# Patient Record
Sex: Male | Born: 1945 | Race: White | Hispanic: No | Marital: Married | Smoking: Former smoker
Health system: Southern US, Community
[De-identification: ages and names within clinical notes are randomized; demographics above are authoritative.]

## PROBLEM LIST (undated history)

## (undated) DIAGNOSIS — N289 Disorder of kidney and ureter, unspecified: Secondary | ICD-10-CM

## (undated) DIAGNOSIS — C140 Malignant neoplasm of pharynx, unspecified: Secondary | ICD-10-CM

## (undated) DIAGNOSIS — C801 Malignant (primary) neoplasm, unspecified: Secondary | ICD-10-CM

## (undated) DIAGNOSIS — Z973 Presence of spectacles and contact lenses: Secondary | ICD-10-CM

## (undated) DIAGNOSIS — Z941 Heart transplant status: Secondary | ICD-10-CM

## (undated) DIAGNOSIS — Z972 Presence of dental prosthetic device (complete) (partial): Secondary | ICD-10-CM

## (undated) DIAGNOSIS — I10 Essential (primary) hypertension: Secondary | ICD-10-CM

## (undated) HISTORY — DX: Malignant (primary) neoplasm, unspecified: C80.1

## (undated) HISTORY — PX: COLONOSCOPY: SHX174

## (undated) HISTORY — PX: APPENDECTOMY: SHX54

---

## 1999-01-19 ENCOUNTER — Inpatient Hospital Stay (HOSPITAL_COMMUNITY): Admission: EM | Admit: 1999-01-19 | Discharge: 1999-01-23 | Payer: Self-pay | Admitting: Emergency Medicine

## 2000-08-31 ENCOUNTER — Ambulatory Visit (HOSPITAL_COMMUNITY): Admission: RE | Admit: 2000-08-31 | Discharge: 2000-08-31 | Payer: Self-pay | Admitting: *Deleted

## 2001-12-18 DIAGNOSIS — Z941 Heart transplant status: Secondary | ICD-10-CM

## 2001-12-18 HISTORY — DX: Heart transplant status: Z94.1

## 2001-12-18 HISTORY — PX: HEART TRANSPLANT: SHX268

## 2001-12-21 ENCOUNTER — Encounter: Payer: Self-pay | Admitting: Cardiology

## 2001-12-21 ENCOUNTER — Inpatient Hospital Stay (HOSPITAL_COMMUNITY): Admission: EM | Admit: 2001-12-21 | Discharge: 2001-12-22 | Payer: Self-pay | Admitting: Cardiology

## 2001-12-21 ENCOUNTER — Encounter: Payer: Self-pay | Admitting: Internal Medicine

## 2001-12-21 ENCOUNTER — Encounter: Payer: Self-pay | Admitting: Emergency Medicine

## 2001-12-22 ENCOUNTER — Encounter: Payer: Self-pay | Admitting: Internal Medicine

## 2006-12-18 HISTORY — PX: HERNIA REPAIR: SHX51

## 2009-02-03 ENCOUNTER — Ambulatory Visit: Payer: Self-pay | Admitting: Gastroenterology

## 2009-02-11 ENCOUNTER — Ambulatory Visit (HOSPITAL_COMMUNITY): Admission: RE | Admit: 2009-02-11 | Discharge: 2009-02-11 | Payer: Self-pay | Admitting: Gastroenterology

## 2009-02-16 ENCOUNTER — Telehealth (INDEPENDENT_AMBULATORY_CARE_PROVIDER_SITE_OTHER): Payer: Self-pay | Admitting: *Deleted

## 2009-02-16 LAB — CONVERTED CEMR LAB
ALT: 13 units/L (ref 0–53)
AST: 13 units/L (ref 0–37)
Albumin: 4.6 g/dL (ref 3.5–5.2)
Alkaline Phosphatase: 86 units/L (ref 39–117)
BUN: 34 mg/dL — ABNORMAL HIGH (ref 6–23)
Basophils Absolute: 0 10*3/uL (ref 0.0–0.1)
Basophils Relative: 0 % (ref 0–1)
CO2: 21 meq/L (ref 19–32)
Calcium: 9.9 mg/dL (ref 8.4–10.5)
Chloride: 105 meq/L (ref 96–112)
Creatinine, Ser: 1.81 mg/dL — ABNORMAL HIGH (ref 0.40–1.50)
Eosinophils Absolute: 0.1 10*3/uL (ref 0.0–0.7)
Eosinophils Relative: 1 % (ref 0–5)
Glucose, Bld: 84 mg/dL (ref 70–99)
HCT: 38.8 % — ABNORMAL LOW (ref 39.0–52.0)
Helicobacter Pylori Antibody-IgG: <0.4
Hemoglobin: 12.8 g/dL — ABNORMAL LOW (ref 13.0–17.0)
Lymphocytes Relative: 17 % (ref 12–46)
Lymphs Abs: 1.4 10*3/uL (ref 0.7–4.0)
MCHC: 33 g/dL (ref 30.0–36.0)
MCV: 92.6 fL (ref 78.0–100.0)
Monocytes Absolute: 1 10*3/uL (ref 0.1–1.0)
Monocytes Relative: 12 % (ref 3–12)
Neutro Abs: 6.1 10*3/uL (ref 1.7–7.7)
Neutrophils Relative %: 71 % (ref 43–77)
Platelets: 215 10*3/uL (ref 150–400)
Potassium: 4.8 meq/L (ref 3.5–5.3)
RBC: 4.19 M/uL — ABNORMAL LOW (ref 4.22–5.81)
RDW: 13.3 % (ref 11.5–15.5)
Sodium: 139 meq/L (ref 135–145)
Total Bilirubin: 0.4 mg/dL (ref 0.3–1.2)
Total Protein: 6.7 g/dL (ref 6.0–8.3)
WBC: 8.7 10*3/uL (ref 4.0–10.5)

## 2009-03-18 ENCOUNTER — Telehealth (INDEPENDENT_AMBULATORY_CARE_PROVIDER_SITE_OTHER): Payer: Self-pay

## 2009-03-24 ENCOUNTER — Encounter: Payer: Self-pay | Admitting: Gastroenterology

## 2009-05-07 ENCOUNTER — Encounter: Payer: Self-pay | Admitting: Gastroenterology

## 2009-05-27 ENCOUNTER — Ambulatory Visit (HOSPITAL_COMMUNITY): Admission: RE | Admit: 2009-05-27 | Discharge: 2009-05-27 | Payer: Self-pay | Admitting: Internal Medicine

## 2009-12-18 HISTORY — PX: CARDIAC CATHETERIZATION: SHX172

## 2010-12-27 ENCOUNTER — Ambulatory Visit (HOSPITAL_COMMUNITY)
Admission: RE | Admit: 2010-12-27 | Discharge: 2010-12-27 | Payer: Self-pay | Source: Home / Self Care | Attending: Family Medicine | Admitting: Family Medicine

## 2010-12-28 ENCOUNTER — Inpatient Hospital Stay (HOSPITAL_COMMUNITY): Admission: EM | Admit: 2010-12-28 | Discharge: 2011-01-02 | Payer: Self-pay | Source: Home / Self Care

## 2011-01-02 LAB — BASIC METABOLIC PANEL
BUN: 39 mg/dL — ABNORMAL HIGH (ref 6–23)
BUN: 31 mg/dL — ABNORMAL HIGH (ref 6–23)
BUN: 22 mg/dL (ref 6–23)
BUN: 19 mg/dL (ref 6–23)
CO2: 22 mEq/L (ref 19–32)
CO2: 21 mEq/L (ref 19–32)
CO2: 22 mEq/L (ref 19–32)
CO2: 22 mEq/L (ref 19–32)
Calcium: 8.9 mg/dL (ref 8.4–10.5)
Calcium: 8.7 mg/dL (ref 8.4–10.5)
Calcium: 8.7 mg/dL (ref 8.4–10.5)
Calcium: 8.8 mg/dL (ref 8.4–10.5)
Chloride: 109 mEq/L (ref 96–112)
Chloride: 106 mEq/L (ref 96–112)
Chloride: 111 mEq/L (ref 96–112)
Chloride: 107 mEq/L (ref 96–112)
Creatinine, Ser: 2.31 mg/dL — ABNORMAL HIGH (ref 0.4–1.5)
Creatinine, Ser: 2.64 mg/dL — ABNORMAL HIGH (ref 0.4–1.5)
Creatinine, Ser: 2.61 mg/dL — ABNORMAL HIGH (ref 0.4–1.5)
Creatinine, Ser: 2.26 mg/dL — ABNORMAL HIGH (ref 0.4–1.5)
GFR calc Af Amer: 35 mL/min — ABNORMAL LOW (ref >60)
GFR calc Af Amer: 30 mL/min — ABNORMAL LOW (ref >60)
GFR calc Af Amer: 30 mL/min — ABNORMAL LOW (ref >60)
GFR calc Af Amer: 36 mL/min — ABNORMAL LOW (ref >60)
GFR calc non Af Amer: 29 mL/min — ABNORMAL LOW (ref >60)
GFR calc non Af Amer: 25 mL/min — ABNORMAL LOW (ref >60)
GFR calc non Af Amer: 25 mL/min — ABNORMAL LOW (ref >60)
GFR calc non Af Amer: 29 mL/min — ABNORMAL LOW (ref >60)
Glucose, Bld: 91 mg/dL (ref 70–99)
Glucose, Bld: 93 mg/dL (ref 70–99)
Glucose, Bld: 93 mg/dL (ref 70–99)
Glucose, Bld: 94 mg/dL (ref 70–99)
Potassium: 4.2 mEq/L (ref 3.5–5.1)
Potassium: 4.1 mEq/L (ref 3.5–5.1)
Potassium: 4.2 mEq/L (ref 3.5–5.1)
Potassium: 4 mEq/L (ref 3.5–5.1)
Sodium: 139 mEq/L (ref 135–145)
Sodium: 138 mEq/L (ref 135–145)
Sodium: 141 mEq/L (ref 135–145)
Sodium: 139 mEq/L (ref 135–145)

## 2011-01-02 LAB — URINALYSIS, ROUTINE W REFLEX MICROSCOPIC
Bilirubin Urine: NEGATIVE
Bilirubin Urine: NEGATIVE
Ketones, ur: NEGATIVE mg/dL
Ketones, ur: NEGATIVE mg/dL
Leukocytes, UA: NEGATIVE
Leukocytes, UA: NEGATIVE
Nitrite: NEGATIVE
Nitrite: NEGATIVE
Protein, ur: NEGATIVE mg/dL
Protein, ur: NEGATIVE mg/dL
Specific Gravity, Urine: 1.01 (ref 1.005–1.030)
Specific Gravity, Urine: 1.02 (ref 1.005–1.030)
Urine Glucose, Fasting: NEGATIVE mg/dL
Urine Glucose, Fasting: NEGATIVE mg/dL
Urobilinogen, UA: 0.2 mg/dL (ref 0.0–1.0)
Urobilinogen, UA: 0.2 mg/dL (ref 0.0–1.0)
pH: 5 (ref 5.0–8.0)
pH: 7 (ref 5.0–8.0)

## 2011-01-02 LAB — MAGNESIUM
Magnesium: 1 mg/dL — ABNORMAL LOW (ref 1.5–2.5)
Magnesium: 1.6 mg/dL (ref 1.5–2.5)

## 2011-01-02 LAB — COMPREHENSIVE METABOLIC PANEL
ALT: 10 U/L (ref 0–53)
AST: 18 U/L (ref 0–37)
Albumin: 3.5 g/dL (ref 3.5–5.2)
Alkaline Phosphatase: 39 U/L (ref 39–117)
BUN: 52 mg/dL — ABNORMAL HIGH (ref 6–23)
CO2: 21 mEq/L (ref 19–32)
Calcium: 9.3 mg/dL (ref 8.4–10.5)
Chloride: 101 mEq/L (ref 96–112)
Creatinine, Ser: 2.61 mg/dL — ABNORMAL HIGH (ref 0.4–1.5)
GFR calc Af Amer: 30 mL/min — ABNORMAL LOW (ref >60)
GFR calc non Af Amer: 25 mL/min — ABNORMAL LOW (ref >60)
Glucose, Bld: 132 mg/dL — ABNORMAL HIGH (ref 70–99)
Potassium: 4.3 mEq/L (ref 3.5–5.1)
Sodium: 133 mEq/L — ABNORMAL LOW (ref 135–145)
Total Bilirubin: 0.9 mg/dL (ref 0.3–1.2)
Total Protein: 6.7 g/dL (ref 6.0–8.3)

## 2011-01-02 LAB — DIFFERENTIAL
Basophils Absolute: 0 10*3/uL (ref 0.0–0.1)
Basophils Relative: 0 % (ref 0–1)
Eosinophils Absolute: 0 10*3/uL (ref 0.0–0.7)
Eosinophils Relative: 0 % (ref 0–5)
Lymphocytes Relative: 10 % — ABNORMAL LOW (ref 12–46)
Lymphs Abs: 0.7 10*3/uL (ref 0.7–4.0)
Monocytes Absolute: 0.6 10*3/uL (ref 0.1–1.0)
Monocytes Relative: 8 % (ref 3–12)
Neutro Abs: 6.3 10*3/uL (ref 1.7–7.7)
Neutrophils Relative %: 81 % — ABNORMAL HIGH (ref 43–77)

## 2011-01-02 LAB — GLUCOSE, CAPILLARY
Glucose-Capillary: 104 mg/dL — ABNORMAL HIGH (ref 70–99)
Glucose-Capillary: 99 mg/dL (ref 70–99)
Glucose-Capillary: 97 mg/dL (ref 70–99)
Glucose-Capillary: 143 mg/dL — ABNORMAL HIGH (ref 70–99)
Glucose-Capillary: 161 mg/dL — ABNORMAL HIGH (ref 70–99)
Glucose-Capillary: 161 mg/dL — ABNORMAL HIGH (ref 70–99)
Glucose-Capillary: 90 mg/dL (ref 70–99)
Glucose-Capillary: 107 mg/dL — ABNORMAL HIGH (ref 70–99)

## 2011-01-02 LAB — CBC
HCT: 31.1 % — ABNORMAL LOW (ref 39.0–52.0)
Hemoglobin: 11.3 g/dL — ABNORMAL LOW (ref 13.0–17.0)
MCHC: 36.3 g/dL — ABNORMAL HIGH (ref 30.0–36.0)
MCV: 100 fL (ref 78.0–100.0)
Platelets: 162 10*3/uL (ref 150–400)
RBC: 3.11 MIL/uL — ABNORMAL LOW (ref 4.22–5.81)
RDW: 13.4 % (ref 11.5–15.5)
WBC: 7.8 10*3/uL (ref 4.0–10.5)

## 2011-01-02 LAB — IRON AND TIBC
Iron: 18 ug/dL — ABNORMAL LOW (ref 42–135)
Saturation Ratios: 9 % — ABNORMAL LOW (ref 20–55)
TIBC: 202 ug/dL — ABNORMAL LOW (ref 215–435)
UIBC: 184 ug/dL

## 2011-01-02 LAB — LACTIC ACID, PLASMA: Lactic Acid, Venous: 0.9 mmol/L (ref 0.5–2.2)

## 2011-01-02 LAB — C-REACTIVE PROTEIN: CRP: 9.2 mg/dL — ABNORMAL HIGH (ref <0.6)

## 2011-01-02 LAB — URINE MICROSCOPIC-ADD ON

## 2011-01-02 LAB — PROCALCITONIN: Procalcitonin: 0.12 ng/mL

## 2011-01-02 LAB — CLOSTRIDIUM DIFFICILE BY PCR: Toxigenic C. Difficile by PCR: NEGATIVE

## 2011-01-02 LAB — INFLUENZA PANEL BY PCR (TYPE A & B)
H1N1 flu by pcr: NOT DETECTED
Influenza A By PCR: NEGATIVE
Influenza B By PCR: NEGATIVE

## 2011-01-02 LAB — SEDIMENTATION RATE: Sed Rate: 40 mm/hr — ABNORMAL HIGH (ref 0–16)

## 2011-01-02 LAB — PHOSPHORUS: Phosphorus: 3 mg/dL (ref 2.3–4.6)

## 2011-01-02 LAB — FOLATE: Folate: 18.9 ng/mL

## 2011-01-02 LAB — FERRITIN: Ferritin: 758 ng/mL — ABNORMAL HIGH (ref 22–322)

## 2011-01-02 LAB — VITAMIN B12: Vitamin B-12: 285 pg/mL (ref 211–911)

## 2011-01-04 LAB — CULTURE, BLOOD (ROUTINE X 2)
Culture: NO GROWTH
Culture: NO GROWTH

## 2011-01-04 LAB — RENAL FUNCTION PANEL
Albumin: 3.2 g/dL — ABNORMAL LOW (ref 3.5–5.2)
BUN: 17 mg/dL (ref 6–23)
CO2: 25 mEq/L (ref 19–32)
Calcium: 8.7 mg/dL (ref 8.4–10.5)
Chloride: 109 mEq/L (ref 96–112)
Creatinine, Ser: 2.06 mg/dL — ABNORMAL HIGH (ref 0.4–1.5)
GFR calc Af Amer: 40 mL/min — ABNORMAL LOW (ref >60)
GFR calc non Af Amer: 33 mL/min — ABNORMAL LOW (ref >60)
Glucose, Bld: 79 mg/dL (ref 70–99)
Phosphorus: 2.8 mg/dL (ref 2.3–4.6)
Potassium: 4.5 mEq/L (ref 3.5–5.1)
Sodium: 140 mEq/L (ref 135–145)

## 2011-01-04 LAB — TACROLIMUS, BLOOD: Tacrolimus Lvl: 11.4 ng/mL (ref 5.0–20.0)

## 2011-01-04 LAB — GLUCOSE, CAPILLARY: Glucose-Capillary: 85 mg/dL (ref 70–99)

## 2011-01-09 ENCOUNTER — Encounter: Payer: Self-pay | Admitting: Internal Medicine

## 2011-01-09 LAB — METHYLMALONIC ACID, SERUM: Methylmalonic Acid, Quantitative: 280 nmol/L (ref 87–318)

## 2011-01-12 LAB — FECAL FAT, QUANTITATIVE
Fat Quant, Stl Collec: 72 h
Fecal Lipids, 24 hr: 0.3 g/(24.h) (ref <7)
Specimen Total Weight: 223 g

## 2011-01-31 LAB — ANTI-NEUTROPHIL ANTIBODY

## 2011-05-02 NOTE — Assessment & Plan Note (Signed)
NAMEMarland Clarke  [name], TOOPS                CHART#:  [id number]   DATE:  02/03/2009                       DOB:  May 17, 1946   REFERRING PHYSICIAN:  Kirk Ruths, MD   REASON FOR CONSULTATION:  Anorexia.   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  He states that 2 years ago, he began drop weight over a month.  He has had decreased appetite, got full fast, was feeling nauseous just  by looking the food.  His boss had a viral illness which he contracted  and eventually had diarrhea continuously for 2 weeks.  He was admitted  for 4 days.  He was seen by [facility] and delay GI transit  was diagnosed.  They placed him on appetite stimulant (Marinol 2.5 mg  daily) and reduce his medication for his heart transplant.  In 1-2 days,  he states his appetite came back.  Most recently last year, he was  experiencing some symptoms of rejection.  His transplant drugs were  increased.  When his transplant drugs were increased, he said his  appetite decreased.  He has been having problems with decreased appetite  which is his main concern as well as his weight loss.  He prefers to be  175-185 pounds.  He states he talked to the transplant coordinator and  who told him that he might have cancer.  He is not having any pain.  He  denies fever, cough, nausea, vomiting, blood in the stool, abdominal  pain, black-tarry stools, chest pain, or diarrhea.  He has some mild  shortness of breath especially with activity.  He decreased his Prograf  for 4 days and felt like his appetite was increasing.  Then, he followed  that with reducing his mycophenolate for 4 days and he felt like his  appetite increased.  He does have some posterior sinus drainage.  He is  bothered by hiccups 2-3 times a day on a daily basis.  They last 1-2  minutes and then go away.  He also complains of spontaneous belching  and  burping for 2-3 times a day on a daily basis.  He denies any true  heartburn.  If he does have heartburn, he will take Rolaids.   PAST MEDICAL HISTORY:  Per the HPI.   PAST SURGICAL HISTORY:  Appendectomy.   ALLERGIES:  Pepto-Bismol causes a rash.   MEDICATIONS:  1. Aspirin 81 mg daily.  2. Lipitor.  3. Os-Cal D.  4. Prednisone.  5. Prograf 3 mg b.i.d.  6. Multivitamin.  7. Metoprolol 25 mg b.i.d.  8. Mycophenolate 1 g b.i.d.   FAMILY HISTORY:  He denies any family history of colon cancer or colon  polyps.  His mother died after many years of dealing with a rare blood  cancer.   SOCIAL HISTORY:  He is married.  His wife accompanies him here today.  He is still in insurance part-time.  He occasionally smokes a pipe.  He  quit smoking regularly in 2003.  He has not had any regular consumption  of alcohol since he was in his 30s.   REVIEW OF SYSTEMS:  Per the HPI, otherwise all systems are negative.   PHYSICAL EXAMINATION:  VITAL  SIGNS:  Weight 170 pounds, height 6 feet,  BMI 23.1 (healthy), temperature 98.4, blood pressure 156/90, and pulse  60.GENERAL:  He is in no apparent distress.  Alert and oriented  x4.HEENT:  Atraumatic and normocephalic.  Pupils equal and reactive to  light.  Mouth, no oral lesions.  Posterior pharynx without erythema or  exudate.  NECK:  Full range of motion.  No lymphadenopathy.LUNGS:  Clear to  auscultation bilaterally.CARDIOVASCULAR:  Regular rhythm.  Unable to  appreciate a murmur.  ABDOMEN:  Bowel sounds are present, soft, nontender, and nondistended.  No rebound or guarding.  No hepatosplenomegaly.EXTREMITIES:  No cyanosis  or edema.  NEUROLOGIC:  He has no focal neurologic deficits.   ASSESSMENT:  The patient is a 65 year old male, who has anorexia and  weight loss.  The differential diagnosis includes side effects of  medication, Helicobacter pylori gastritis, and a low likelihood of  malignancy. Thank you for allowing me to see the  patient in  consultation.  My recommendations follow.   RECOMMENDATIONS:  1. We will obtain H. pylori serology, complete metabolic panel, and      CBC with diff.  2. We will order a CT scan of chest, abdomen, and pelvis with oral      contrast only.  He is concerned about getting IV contrast due to      his borderline kidney function.  3. I have given him a prescription for Marinol 2.5 mg and asked him to      take 1-2 p.o. daily.  He is given #30 days with 2 prescriptions      each with #45 pills.  4. He has a followup appointment to see me in 2 months.  We will call      him with the results of his scans and labs.  He is going on      vacation on February 22, 2009.  Once the results of the labs and the CT      scan are known, then we will reassess the need for an upper      endoscopy.   <ADDENDUM 31510/>  No source for anorexia and weight loss identified. H. pylori serology <  0.4, CBC: hb 12.8, CMP: BUN 34, Cr 1.81 o/w WNLs. CT Scan of C/A/P  without IV contrast: nonspecific pericarinal  nodes, hepatic cysts, possible gallstones, minimal prostate enlargement.  [facility] No need for EGD at this  time.       Kassie Mends, M.D.  Electronically Signed     SM/MEDQ  D:  02/03/2009  T:  02/03/2009  Job:  [id number]   cc:   Kirk Ruths, M.D.

## 2011-05-05 NOTE — Cardiovascular Report (Signed)
[place]  Patient:    Peter Clarke, Peter Clarke Visit Number: [id number] MRN: [id number]          Service Type: EMS Location: ED Attending Physician:  Annamarie Dawley Dictated by:   Everardo Beals Juanda Chance, M.D. [facility] Proc. Date: 12/22/01 Admit Date:  12/21/2001 Discharge Date: 12/21/2001   CC:         Doylene Canning. Ladona Ridgel, M.D. [facility]  Dr. Inocente Salles, M.D. [facility]  Cardiac Catheterization Laboratory   Cardiac Catheterization  PROCEDURE:  Intra-aortic balloon pump placement.  INDICATIONS:  Peter Clarke was admitted yesterday with congestive heart failure and shock and underwent catheterization last night and was found to have a severe nonischemic cardiomyopathy.  We felt this may be due to an acute viral myocarditis.  He has not improved today and plans are being made to transfer him to [facility] so that he will have availability of transplant services if needed.  We are placing an intra-aortic balloon pump today prior to transfer.  DESCRIPTION OF PROCEDURE:  A 40 cc Datascope intra-aortic balloon pump was inserted via the left femoral artery.  A front wall arterial puncture was performed.  We first performed a distal aortogram to evaluate the distal aorta.  The balloon pump was then placed without complications.  The patient tolerated the procedure well without difficulty.  CONCLUSIONS:  Placement of intra-aortic balloon pump for cardiogenic shock due to severe nonischemic cardiomyopathy.  ADDENDUM:  The distal aortogram showed patent renal arteries and no significant aortoiliac obstruction. Dictated by:   Everardo Beals Juanda Chance, M.D. [facility] Attending Physician:  Annamarie Dawley DD:  12/22/01 TD:  12/22/01 Job: [id number]

## 2011-05-05 NOTE — Cardiovascular Report (Signed)
[place]  Patient:    Peter Clarke, Peter Clarke                        MRN: [id number] Proc. Date: 08/31/00 Adm. Date:  [id number] Attending:  Daisey Must CCGreggory Stallion _____, M.D.  Cardiac Catheterization Laboratory   Cardiac Catheterization  PROCEDURES PERFORMED: 1. Left heart catheterization with coronary angiography and left    ventriculography. 2. Intravascular ultrasound of the left anterior descending artery.  INDICATIONS:  [name] is a 65 year old male with coronary artery disease. He was enrolled in the REVERSAL study and underwent baseline intravascular ultrasound 18 months ago.  He now returns for protocol mandated followup angiography and intravascular ultrasound.  DESCRIPTION OF PROCEDURE:  A 6 French sheath was placed in the right femoral artery.  We initially performed diagnostic catheterization with Standard Judkins 6 French catheters were utilized.  Contrast was Omnipaque.  Following completion of the diagnostic catheterization we proceeded with intravascular ultrasound of the left anterior descending artery.  We used a 7 [place] guiding catheter.  Heparin was administered to achieve an ACT greater than 200 seconds.  We used a Hi-Torque Floppy wire and a 3.2 Ultra-Cross intravascular ultrasound catheter.  Intravascular ultrasound of the left anterior descending artery was performed with video tape recording of the images and automatic pullback.  The patient tolerated the procedure well and there were no complications.  At the conclusion of the case the catheters were removed and the Perclose vascular closure device was placed in the right femoral artery with good hemostasis.  There were no complications.  RESULTS:  HEMODYNAMICS:  Left ventricular pressure 120/18.  Aortic pressure 120/74. There was no aortic valve gradient.  LEFT VENTRICULOGRAM:  There is mild hypokinesis of the inferior wall, otherwise, normal wall  motion.  Ejection fraction calculated at 55%.  No mitral regurgitation.  CORONARY ARTERIOGRAPHY:  (Right dominant).  Left main:  Left main has an ostial 30% stenosis.  Left anterior descending:  The left anterior descending has a 30% stenosis in the proximal vessel and diffuse luminal irregularities in the mid vessel.  The LAD gives rise to a small first diagonal, small to normal sized second diagonal and a small to normal sized third diagonal.  Left circumflex:  The left circumflex has a 30% stenosis in the proximal vessel, 40% in the mid vessel and 30% in the distal vessel.  It gives rise to a small OM-1, normal sized OM-2 and normal sized OM-3.  Right coronary artery:  The right coronary artery is a dominant vessel.  There is a 40% stenosis at the origin of the right coronary artery and a 40% stenosis in the proximal vessel.  The distal vessel has a 50% stenosis before the posterior descending artery.  There is a normal sized posterior descending artery and two small posterolateral branches.  Intravascular ultrasound of left anterior descending artery revealed moderate atherosclerotic disease of the mid and proximal left anterior descending artery, the proximal LAD.  The minimal lumen diameter was approximately 2.5 mm compared to a reference diameter of 3.5 mm.  This calculates to a 30% diameter by intravascular ultrasound.  IMPRESSION: 1. Left ventricular systolic function at lower limits of normal. 2. Nonobstructive coronary artery disease. DD:  08/31/00 TD:  08/31/00 Job: [id number]

## 2011-05-05 NOTE — Cardiovascular Report (Signed)
. [facility]  Patient:    Peter Clarke, Peter Clarke Visit Number: [id number] MRN: [id number]          Service Type: EMS Location: ED Attending Physician:  Annamarie Dawley Dictated by:   Everardo Beals Juanda Chance, M.D. [facility] Proc. Date: 12/22/01 Admit Date:  12/21/2001 Discharge Date: 12/21/2001   CC:         Rudene Christians. Ladona Ridgel, M.D. [facility]  Daisey Must, M.D. [facility]  Shawnie Pons, M.D.   Cardiac Catheterization  INDICATIONS:  Mr. Catala is 65 years old and has known nonobstructive coronary artery disease and has been part of the reversal trial.  He developed symptoms of shortness of breath over the past week and presented to [facility] in acute pulmonary edema requiring intubation.  His CK-MBs were positive.  He was transferred here and Dr. Ladona Ridgel did an electrocardiogram which showed severe left ventricular dysfunction.  His electrocardiogram suggested anterior and inferior infarctions, although, there were not acute ST elevations.  We made the decision to bring him to the lab emergently for further evaluation.  DESCRIPTION OF PROCEDURE:  Right heart catheterization was performed percutaneously via the right femoral vein using a venous sheath and a Swan-Ganz thermodilution catheter.  Left heart catheterization was performed percutaneously via the right femoral artery using arterial sheath and 6 French preformed coronary catheters.  A front wall arterial puncture was performed and Omnipaque contrast was used.  The patient was intubated and sedated during the procedure and he was hypotensive in shock requiring dopamine to maintain a blood pressure of 80 to 90 systolic.  RESULTS:  The left main coronary artery was free of significant disease.  The left anterior descending artery gave rise to three diagonal branches and a large septal perforator.  The LAD was irregular and there was 30% proximal and 30% midnarrowing.  The circumflex artery gave  rise to a small intermediate branch, an atrial branch, a small marginal branch, and two posterolateral branches.  This vessel was irregular, but free of major obstruction.  The right coronary artery was a large dominant vessel that gave rise to a conus branch, two right ventricular branches, two posterior descending branches, and three posterolateral branches.  The right coronary artery was irregular with 30% narrowing in the midvessel and 30% narrowing in the distal vessel.  The left ventriculogram performed in the RAO projection showed global hypokinesis with an estimated ejection fraction of 15%.  There was moderate calcification in the mitral valve annulus.  HEMODYNAMIC DATA:  The right atrial pressure was 14 mean.  The pulmonary artery pressure was 43/26 with a mean of 35.  Pulmonary wedge pressure was 28 mean and this increased to 33 at the end of the procedure.  The left ventricular pressure was 90/31 and the aortic pressure was 90/64.  Cardiac output/cardiac index was 3.9/1.9 liters/minute/meter squared by Fick.  CONCLUSION: 1. Severe nonischemic cardiomyopathy with markedly elevated left ventricular    filling pressures and severe hypotension with shock. 2. Nonobstructive coronary artery disease with 30% proximal and 30%    midnarrowing in the LAD and 30% mid and 30% distal narrowing in the right    coronary artery.  RECOMMENDATIONS:  The patient has a nonischemic cardiomyopathy with symptoms about one-week in duration.  I think it is quite possible this is due to a viral myocarditis.  He also has nonobstructive coronary artery disease.  Will plan continued medical management and support.  We may want to consider transfer to a transplant  center in case his left ventricle does not recover. Dictated by:   Everardo Beals Juanda Chance, M.D. [facility] Attending Physician:  Annamarie Dawley DD:  12/22/01 TD:  12/22/01 Job: [id number]

## 2011-05-05 NOTE — Discharge Summary (Signed)
[place]  Patient:    Peter Clarke, Peter Clarke Visit Number: [id number] MRN: [id number]          Service Type: EMS Location: ED Attending Physician:  Annamarie Dawley Dictated by:   Doylene Canning. Ladona Ridgel, M.D. FACC Admit Date:  12/21/2001 Discharge Date: 12/21/2001   CC:         Daisey Must, M.D. [facility]  [place] S. Emilee Hero, M.D., [place], [place].   Discharge Summary  BRIEF HISTORY:  The patient is a very pleasant 65 year old man who was admitted and transferred from the [facility] where he was found to have ventilatory dependent respiratory failure in the setting of acute myocardial infarction.  His history was provided by his wife.  The patient apparently was well until 1 week ago.  PAST MEDICAL HISTORY:  Notable for nonobstructive coronary disease.  Over the last week he had developed intermittent episodes of fever and chills and malaise.  Two days prior to admission he complained of weakness and nausea.  On the day of admission to the hospital he had worsening nausea in association with shortness of breath and chest pressure.  His shortness of breath persisted in worsening and he subsequently presented to the emergency department where he was found to be in respiratory distress and was hypotensive.  He was in a near QRS tachycardia and was subsequently given intravenous adenosine which did not terminate his tachycardia and this was followed by DC cardioversion x 2.  The patient subsequently was found to be in sinus tachycardia.  On transfer to this facility the patient was intubated and sedated.  He was on dopamine at 7 mcg/kilo/min and blood pressure was 90 to 95.  His cardiac enzymes demonstrated a troponin of 20 and a CK of 687 with 47 MB fraction.  His EKG demonstrated sinus tachycardia at 150.  His EKG demonstrated inferior Qs along with anteroseptal Q waves with residual ST elevation in the inferior leads.  There was 2 mm of ST  segment depression in leads V4, V5, and V6.  A bedside 2-D echocardiogram was obtained which demonstrated global hypokinesis.  He was subsequently taken for emergent catheterization.  His right heart catheterization demonstrated a pulmonary capillary wedge pressure of 30-35. His pulmonary artery pressures were in the 60 range.  Cardiac index by the Fick technique was 1.9.  The patient remained intermittently febrile.  Blood cultures were obtained.  It should be noted that left heart catheterization demonstrated nonobstructive coronary disease. His left ventriculogram had an estimated ejection fraction of 20% without significant mitral regurgitation.  He was continued on dopamine and dobutamine was initiated.  He was initially treated with IV Lasix despite his hypotension and this resulted in a fairly vigorous diuresis.  His arterial blood gas normalized.  On admission his pH was 7.24 with a pCO2 of 53, and a pO2 of 112.  Today his blood gas demonstrated a pH of 7.44, pCO2 of 32, and a pO2 of 242.  His pulmonary diastolic pressure was in the mid 20s, and he was additional Lasix. His systolic pressures over the next several hours continued to dip and subsequently the systolic pressure now is in the mid 70s with a pulmonary capillary wedge pressure in the 18 range.  This is despite intravenous inotropic support.  Because of his ongoing cardiogenic shock ______ pressors he will be transferred to [facility] for additional care and consideration of a left ventricular assist device. Dictated by:   Doylene Canning. Ladona Ridgel, M.D.  [facility] Attending Physician:  Annamarie Dawley DD:  12/22/01 TD:  12/22/01 Job: [id number]

## 2013-06-09 ENCOUNTER — Telehealth: Payer: Self-pay | Admitting: Family Medicine

## 2013-06-09 NOTE — Telephone Encounter (Signed)
You may give him an appointment in one of my same day slots for tomorrow, or Wednesday

## 2013-06-09 NOTE — Telephone Encounter (Signed)
Pt to make an app

## 2013-06-09 NOTE — Telephone Encounter (Signed)
Pt has a Friday appt with Eber Jones, and the pt would like to know if he can see you on another day this week? His lump in his throat has him concerned it may be cancer, it is painful and been present for a little over a month. You have all same day appts available at this point in time.  Not asking to come in today, just sometime this week. Thanks

## 2013-06-13 ENCOUNTER — Ambulatory Visit (INDEPENDENT_AMBULATORY_CARE_PROVIDER_SITE_OTHER): Payer: Medicare Other | Admitting: Nurse Practitioner

## 2013-06-13 ENCOUNTER — Encounter: Payer: Self-pay | Admitting: Nurse Practitioner

## 2013-06-13 VITALS — BP 122/80 | Temp 98.7°F | Wt 182.0 lb

## 2013-06-13 DIAGNOSIS — R1310 Dysphagia, unspecified: Secondary | ICD-10-CM

## 2013-06-13 DIAGNOSIS — R5383 Other fatigue: Secondary | ICD-10-CM

## 2013-06-13 DIAGNOSIS — Z941 Heart transplant status: Secondary | ICD-10-CM

## 2013-06-13 DIAGNOSIS — J309 Allergic rhinitis, unspecified: Secondary | ICD-10-CM

## 2013-06-13 DIAGNOSIS — J300 Vasomotor rhinitis: Secondary | ICD-10-CM

## 2013-06-13 DIAGNOSIS — R5381 Other malaise: Secondary | ICD-10-CM

## 2013-06-13 NOTE — Patient Instructions (Addendum)
Nasacort AQ as directed 

## 2013-06-14 LAB — CBC WITH DIFFERENTIAL/PLATELET
Basophils Relative: 1 % (ref 0–1)
Eosinophils Absolute: 0.1 10*3/uL (ref 0.0–0.7)
Eosinophils Relative: 3 % (ref 0–5)
HCT: 33.9 % — ABNORMAL LOW (ref 39.0–52.0)
Hemoglobin: 12.2 g/dL — ABNORMAL LOW (ref 13.0–17.0)
Lymphs Abs: 0.6 10*3/uL — ABNORMAL LOW (ref 0.7–4.0)
MCH: 35.1 pg — ABNORMAL HIGH (ref 26.0–34.0)
MCHC: 36 g/dL (ref 30.0–36.0)
MCV: 97.4 fL (ref 78.0–100.0)
Monocytes Absolute: 0.5 10*3/uL (ref 0.1–1.0)
Monocytes Relative: 14 % — ABNORMAL HIGH (ref 3–12)
Neutrophils Relative %: 65 % (ref 43–77)
RBC: 3.48 MIL/uL — ABNORMAL LOW (ref 4.22–5.81)

## 2013-06-14 LAB — BASIC METABOLIC PANEL
CO2: 26 mEq/L (ref 19–32)
Chloride: 106 mEq/L (ref 96–112)
Creat: 1.99 mg/dL — ABNORMAL HIGH (ref 0.50–1.35)
Potassium: 4.8 mEq/L (ref 3.5–5.3)

## 2013-06-14 LAB — HEPATIC FUNCTION PANEL
ALT: 12 U/L (ref 0–53)
AST: 17 U/L (ref 0–37)
Albumin: 4 g/dL (ref 3.5–5.2)
Alkaline Phosphatase: 66 U/L (ref 39–117)
Total Protein: 6.4 g/dL (ref 6.0–8.3)

## 2013-06-14 LAB — TSH: TSH: 1.463 u[IU]/mL (ref 0.350–4.500)

## 2013-06-17 ENCOUNTER — Encounter: Payer: Self-pay | Admitting: Nurse Practitioner

## 2013-06-17 DIAGNOSIS — Z941 Heart transplant status: Secondary | ICD-10-CM | POA: Insufficient documentation

## 2013-06-17 NOTE — Addendum Note (Signed)
Addended by: Margaretha Sheffield on: 06/17/2013 01:28 PM   Modules accepted: Orders

## 2013-06-17 NOTE — Progress Notes (Signed)
Subjective:  Presents for complaints of a feeling like a lump in the left side of his throat for the past 2 months. Constant. Some difficulty swallowing. No change in his voice. No sore throat. No fever. Some postnasal drainage. No acid reflux or heartburn. Slight decrease in energy over the past week or so. Former cigarette smoker. Now smokes a rare cigar. No oral tobacco use.  Objective:   BP 122/80  Temp(Src) 98.7 F (37.1 C) (Oral)  Wt 182 lb (82.555 kg) NAD. Alert, oriented. TMs mild clear effusion, no erythema. Pharynx injected with cloudy PND noted. Neck supple with mild soft nontender adenopathy. Thyroid normal limit to palpation and nontender. Lungs clear. Heart regular rate rhythm. Abdomen soft nondistended nontender.  Assessment:Dysphagia  Other malaise and fatigue - Plan: Basic metabolic panel, CBC with Differential, Hepatic function panel, TSH, Testosterone, Basic metabolic panel, CBC with Differential, Hepatic function panel, TSH, Testosterone  Vasomotor rhinitis  Plan: Nasacort AQ as directed. Lab work pending. Refer to ENT specialist for further evaluation due to smoking history.

## 2013-07-03 ENCOUNTER — Ambulatory Visit (INDEPENDENT_AMBULATORY_CARE_PROVIDER_SITE_OTHER): Payer: Medicare Other | Admitting: Otolaryngology

## 2013-07-03 ENCOUNTER — Other Ambulatory Visit (INDEPENDENT_AMBULATORY_CARE_PROVIDER_SITE_OTHER): Payer: Self-pay | Admitting: Otolaryngology

## 2013-07-03 ENCOUNTER — Encounter (HOSPITAL_BASED_OUTPATIENT_CLINIC_OR_DEPARTMENT_OTHER): Payer: Self-pay | Admitting: *Deleted

## 2013-07-03 DIAGNOSIS — R221 Localized swelling, mass and lump, neck: Secondary | ICD-10-CM

## 2013-07-03 DIAGNOSIS — D3709 Neoplasm of uncertain behavior of other specified sites of the oral cavity: Secondary | ICD-10-CM

## 2013-07-03 DIAGNOSIS — R070 Pain in throat: Secondary | ICD-10-CM

## 2013-07-03 NOTE — Progress Notes (Signed)
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

## 2013-07-04 ENCOUNTER — Encounter (HOSPITAL_COMMUNITY): Payer: Self-pay

## 2013-07-04 ENCOUNTER — Encounter (HOSPITAL_BASED_OUTPATIENT_CLINIC_OR_DEPARTMENT_OTHER): Payer: Self-pay | Admitting: *Deleted

## 2013-07-04 ENCOUNTER — Ambulatory Visit (HOSPITAL_COMMUNITY)
Admission: RE | Admit: 2013-07-04 | Discharge: 2013-07-04 | Disposition: A | Discharge status: Home / Self Care | Payer: Medicare Other | Source: Ambulatory Visit | Attending: Otolaryngology | Admitting: Otolaryngology

## 2013-07-04 DIAGNOSIS — K149 Disease of tongue, unspecified: Secondary | ICD-10-CM | POA: Insufficient documentation

## 2013-07-04 DIAGNOSIS — R221 Localized swelling, mass and lump, neck: Secondary | ICD-10-CM

## 2013-07-04 DIAGNOSIS — R9389 Abnormal findings on diagnostic imaging of other specified body structures: Secondary | ICD-10-CM | POA: Insufficient documentation

## 2013-07-04 DIAGNOSIS — J3489 Other specified disorders of nose and nasal sinuses: Secondary | ICD-10-CM | POA: Insufficient documentation

## 2013-07-04 MED ORDER — IOHEXOL 300 MG/ML  SOLN: 50.0000 mL | Freq: Once | INTRAMUSCULAR | Status: DC | PRN

## 2013-07-04 MED ORDER — IOHEXOL 300 MG/ML  SOLN
60.0000 mL | Freq: Once | INTRAMUSCULAR | Status: AC | PRN
  Administered 2013-07-04: 60 mL via INTRAVENOUS

## 2013-07-07 ENCOUNTER — Ambulatory Visit (HOSPITAL_BASED_OUTPATIENT_CLINIC_OR_DEPARTMENT_OTHER)
Admission: RE | Admit: 2013-07-07 | Discharge: 2013-07-07 | Disposition: A | Discharge status: Home / Self Care | Payer: Medicare Other | Source: Ambulatory Visit | Attending: Otolaryngology | Admitting: Otolaryngology

## 2013-07-07 ENCOUNTER — Encounter (HOSPITAL_BASED_OUTPATIENT_CLINIC_OR_DEPARTMENT_OTHER)
Admission: RE | Disposition: A | Discharge status: Home / Self Care | Payer: Self-pay | Source: Ambulatory Visit | Attending: Otolaryngology

## 2013-07-07 ENCOUNTER — Encounter (HOSPITAL_BASED_OUTPATIENT_CLINIC_OR_DEPARTMENT_OTHER): Payer: Self-pay | Admitting: Anesthesiology

## 2013-07-07 ENCOUNTER — Ambulatory Visit (HOSPITAL_BASED_OUTPATIENT_CLINIC_OR_DEPARTMENT_OTHER): Payer: Medicare Other | Admitting: Anesthesiology

## 2013-07-07 DIAGNOSIS — Z85828 Personal history of other malignant neoplasm of skin: Secondary | ICD-10-CM | POA: Insufficient documentation

## 2013-07-07 DIAGNOSIS — I10 Essential (primary) hypertension: Secondary | ICD-10-CM | POA: Insufficient documentation

## 2013-07-07 DIAGNOSIS — F17200 Nicotine dependence, unspecified, uncomplicated: Secondary | ICD-10-CM | POA: Insufficient documentation

## 2013-07-07 DIAGNOSIS — Z79899 Other long term (current) drug therapy: Secondary | ICD-10-CM | POA: Insufficient documentation

## 2013-07-07 DIAGNOSIS — Z888 Allergy status to other drugs, medicaments and biological substances status: Secondary | ICD-10-CM | POA: Insufficient documentation

## 2013-07-07 DIAGNOSIS — Z941 Heart transplant status: Secondary | ICD-10-CM | POA: Insufficient documentation

## 2013-07-07 DIAGNOSIS — Z9089 Acquired absence of other organs: Secondary | ICD-10-CM

## 2013-07-07 DIAGNOSIS — N289 Disorder of kidney and ureter, unspecified: Secondary | ICD-10-CM | POA: Insufficient documentation

## 2013-07-07 DIAGNOSIS — C099 Malignant neoplasm of tonsil, unspecified: Secondary | ICD-10-CM | POA: Insufficient documentation

## 2013-07-07 HISTORY — DX: Disorder of kidney and ureter, unspecified: N28.9

## 2013-07-07 HISTORY — PX: TONSILLECTOMY: SHX5217

## 2013-07-07 HISTORY — DX: Presence of dental prosthetic device (complete) (partial): Z97.2

## 2013-07-07 HISTORY — DX: Essential (primary) hypertension: I10

## 2013-07-07 HISTORY — PX: DIRECT LARYNGOSCOPY: SHX5326

## 2013-07-07 HISTORY — DX: Presence of spectacles and contact lenses: Z97.3

## 2013-07-07 HISTORY — DX: Heart transplant status: Z94.1

## 2013-07-07 SURGERY — TONSILLECTOMY
Anesthesia: General | Site: Mouth | Wound class: Clean Contaminated

## 2013-07-07 MED ORDER — DEXAMETHASONE SODIUM PHOSPHATE 4 MG/ML IJ SOLN
INTRAMUSCULAR | Status: DC | PRN
  Administered 2013-07-07: 10 mg via INTRAVENOUS

## 2013-07-07 MED ORDER — ONDANSETRON HCL 4 MG/2ML IJ SOLN: 4.0000 mg | Freq: Once | INTRAMUSCULAR | Status: DC | PRN

## 2013-07-07 MED ORDER — MEPERIDINE HCL 25 MG/ML IJ SOLN: 6.2500 mg | INTRAMUSCULAR | Status: DC | PRN

## 2013-07-07 MED ORDER — HYDROMORPHONE HCL PF 1 MG/ML IJ SOLN
0.2500 mg | INTRAMUSCULAR | Status: DC | PRN
Start: 2013-07-07 — End: 2013-07-07
  Administered 2013-07-07: 0.5 mg via INTRAVENOUS

## 2013-07-07 MED ORDER — ONDANSETRON HCL 4 MG/2ML IJ SOLN
INTRAMUSCULAR | Status: DC | PRN
  Administered 2013-07-07: 4 mg via INTRAVENOUS

## 2013-07-07 MED ORDER — LACTATED RINGERS IV SOLN
INTRAVENOUS | Status: DC
  Administered 2013-07-07 (×2): via INTRAVENOUS

## 2013-07-07 MED ORDER — OXYCODONE-ACETAMINOPHEN 5-325 MG/5ML PO SOLN: 5.0000 mL | ORAL | Status: DC | PRN

## 2013-07-07 MED ORDER — FENTANYL CITRATE 0.05 MG/ML IJ SOLN
INTRAMUSCULAR | Status: DC | PRN
  Administered 2013-07-07: 100 ug via INTRAVENOUS

## 2013-07-07 MED ORDER — OXYCODONE HCL 5 MG PO TABS: 5.0000 mg | ORAL_TABLET | Freq: Once | ORAL | Status: AC | PRN

## 2013-07-07 MED ORDER — OXYMETAZOLINE HCL 0.05 % NA SOLN
NASAL | Status: DC | PRN
  Administered 2013-07-07: 1 via NASAL

## 2013-07-07 MED ORDER — AMOXICILLIN 400 MG/5ML PO SUSR: 800.0000 mg | Freq: Two times a day (BID) | ORAL | Status: AC

## 2013-07-07 MED ORDER — SUCCINYLCHOLINE CHLORIDE 20 MG/ML IJ SOLN
INTRAMUSCULAR | Status: DC | PRN
  Administered 2013-07-07: 100 mg via INTRAVENOUS

## 2013-07-07 MED ORDER — BACITRACIN ZINC 500 UNIT/GM EX OINT
TOPICAL_OINTMENT | CUTANEOUS | Status: DC | PRN
  Administered 2013-07-07: 1 via TOPICAL

## 2013-07-07 MED ORDER — LIDOCAINE HCL (CARDIAC) 20 MG/ML IV SOLN
INTRAVENOUS | Status: DC | PRN
  Administered 2013-07-07: 50 mg via INTRAVENOUS

## 2013-07-07 MED ORDER — PROPOFOL 10 MG/ML IV BOLUS
INTRAVENOUS | Status: DC | PRN
  Administered 2013-07-07: 150 mg via INTRAVENOUS
  Administered 2013-07-07: 50 mg via INTRAVENOUS

## 2013-07-07 MED ORDER — OXYCODONE HCL 5 MG/5ML PO SOLN
5.0000 mg | Freq: Once | ORAL | Status: AC | PRN
  Administered 2013-07-07: 5 mg via ORAL

## 2013-07-07 MED ORDER — MIDAZOLAM HCL 5 MG/5ML IJ SOLN
INTRAMUSCULAR | Status: DC | PRN
  Administered 2013-07-07: 2 mg via INTRAVENOUS

## 2013-07-07 SURGICAL SUPPLY — 40 items
BANDAGE COBAN STERILE 2 (GAUZE/BANDAGES/DRESSINGS) IMPLANT
CANISTER SUCTION 1200CC (MISCELLANEOUS) ×3 IMPLANT
CATH ROBINSON RED A/P 10FR (CATHETERS) IMPLANT
CATH ROBINSON RED A/P 14FR (CATHETERS) ×1 IMPLANT
CLOTH BEACON ORANGE TIMEOUT ST (SAFETY) ×3 IMPLANT
COAGULATOR SUCT SWTCH 10FR 6 (ELECTROSURGICAL) IMPLANT
COVER MAYO STAND STRL (DRAPES) ×3 IMPLANT
ELECT REM PT RETURN 9FT ADLT (ELECTROSURGICAL) ×3
ELECT REM PT RETURN 9FT PED (ELECTROSURGICAL)
ELECTRODE REM PT RETRN 9FT PED (ELECTROSURGICAL) IMPLANT
ELECTRODE REM PT RTRN 9FT ADLT (ELECTROSURGICAL) IMPLANT
GAUZE SPONGE 4X4 12PLY STRL LF (GAUZE/BANDAGES/DRESSINGS) ×3 IMPLANT
GLOVE BIO SURGEON STRL SZ7.5 (GLOVE) ×3 IMPLANT
GLOVE BIOGEL PI IND STRL 7.0 (GLOVE) IMPLANT
GLOVE BIOGEL PI INDICATOR 7.0 (GLOVE) ×1
GLOVE ECLIPSE 7.5 STRL STRAW (GLOVE) ×1 IMPLANT
GOWN PREVENTION PLUS XLARGE (GOWN DISPOSABLE) ×4 IMPLANT
GUARD TEETH (MISCELLANEOUS) IMPLANT
IV NS 500ML (IV SOLUTION) ×3
IV NS 500ML BAXH (IV SOLUTION) ×2 IMPLANT
MARKER SKIN DUAL TIP RULER LAB (MISCELLANEOUS) IMPLANT
NDL SPNL 22GX7 QUINCKE BK (NEEDLE) IMPLANT
NDL SPNL 25GX3.5 QUINCKE BL (NEEDLE) IMPLANT
NEEDLE SPNL 22GX7 QUINCKE BK (NEEDLE) IMPLANT
NEEDLE SPNL 25GX3.5 QUINCKE BL (NEEDLE) IMPLANT
NS IRRIG 1000ML POUR BTL (IV SOLUTION) ×3 IMPLANT
PATTIES SURGICAL .5 X3 (DISPOSABLE) ×2 IMPLANT
SHEET MEDIUM DRAPE 40X70 STRL (DRAPES) ×3 IMPLANT
SLEEVE SCD COMPRESS KNEE MED (MISCELLANEOUS) IMPLANT
SOLUTION BUTLER CLEAR DIP (MISCELLANEOUS) ×6 IMPLANT
SPONGE TONSIL 1 RF SGL (DISPOSABLE) IMPLANT
SPONGE TONSIL 1.25 RF SGL STRG (GAUZE/BANDAGES/DRESSINGS) ×1 IMPLANT
SYR BULB 3OZ (MISCELLANEOUS) ×1 IMPLANT
SYR CONTROL 10ML LL (SYRINGE) IMPLANT
SYR TB 1ML LL NO SAFETY (SYRINGE) IMPLANT
TOWEL OR 17X24 6PK STRL BLUE (TOWEL DISPOSABLE) ×3 IMPLANT
TUBE CONNECTING 20X1/4 (TUBING) ×3 IMPLANT
TUBE SALEM SUMP 12R W/ARV (TUBING) IMPLANT
TUBE SALEM SUMP 16 FR W/ARV (TUBING) ×1 IMPLANT
WAND COBLATOR 70 EVAC XTRA (SURGICAL WAND) ×3 IMPLANT

## 2013-07-07 NOTE — Anesthesia Procedure Notes (Signed)
Procedure Name: Intubation Date/Time: 07/07/2013 12:38 PM Performed by: Caren Macadam Pre-anesthesia Checklist: Patient identified, Emergency Drugs available, Suction available and Patient being monitored Patient Re-evaluated:Patient Re-evaluated prior to inductionOxygen Delivery Method: Circle System Utilized Preoxygenation: Pre-oxygenation with 100% oxygen Intubation Type: IV induction Ventilation: Mask ventilation without difficulty Laryngoscope Size: Miller and 2 Tube type: Oral Tube size: 6.5 mm Number of attempts: 1 Airway Equipment and Method: stylet and oral airway Placement Confirmation: ETT inserted through vocal cords under direct vision,  positive ETCO2 and breath sounds checked- equal and bilateral Secured at: 22 cm Tube secured with: Tape Dental Injury: Teeth and Oropharynx as per pre-operative assessment

## 2013-07-07 NOTE — Transfer of Care (Signed)
Immediate Anesthesia Transfer of Care Note  Patient: Peter Clarke  Procedure(s) Performed: Procedure(s): TONSILLECTOMY (Left) DIRECT LARYNGOSCOPY WITH BIOPSY (N/A)  Patient Location: PACU  Anesthesia Type:General  Level of Consciousness: awake and alert   Airway & Oxygen Therapy: Patient Spontanous Breathing and Patient connected to face mask oxygen  Post-op Assessment: Report given to PACU RN and Post -op Vital signs reviewed and stable  Post vital signs: Reviewed and stable  Complications: No apparent anesthesia complications

## 2013-07-07 NOTE — H&P (Signed)
H&P Update  [name] original H&P dated 07/03/13 reviewed and placed in chart (to be scanned).  I personally examined the patient today.  No change in health. Proceed with left tonsillectomy and direct laryngoscopy.

## 2013-07-07 NOTE — Brief Op Note (Signed)
07/07/2013  1:04 PM  PATIENT:  Peter Clarke  67 y.o. male  PRE-OPERATIVE DIAGNOSIS:  LEFT TONSIL MASS  POST-OPERATIVE DIAGNOSIS:  Left tonsil mass  PROCEDURE:  Procedure(s): TONSILLECTOMY (Left) DIRECT LARYNGOSCOPY   SURGEON:  Surgeon(s) and Role:    * Sui W Owen Pagnotta, MD - Primary  PHYSICIAN ASSISTANT:   ASSISTANTS: none   ANESTHESIA:   general  EBL:  Total I/O In: 1000 [I.V.:1000] Out: -   BLOOD ADMINISTERED:none  DRAINS: none   LOCAL MEDICATIONS USED:  NONE  SPECIMEN:  Source of Specimen:  Left tonsillar mass  DISPOSITION OF SPECIMEN:  PATHOLOGY  COUNTS:  YES  TOURNIQUET:  * No tourniquets in log *  DICTATION: .Other Dictation: Dictation Number [id number]  PLAN OF CARE: Discharge to home after PACU  PATIENT DISPOSITION:  PACU - hemodynamically stable.   Delay start of Pharmacological VTE agent (>24hrs) due to surgical blood loss or risk of bleeding: not applicable

## 2013-07-07 NOTE — Anesthesia Preprocedure Evaluation (Signed)
Anesthesia Evaluation  Patient identified by MRN, date of birth, ID band Patient awake    Reviewed: Allergy & Precautions, H&P , NPO status , Patient's Chart, lab work & pertinent test results  Airway Mallampati: I TM Distance: >3 FB Neck ROM: Full    Dental   Pulmonary          Cardiovascular hypertension, Pt. on medications  H/O Heart transplant at [name] 11 years ago. Now stable sees cardiologist q53monthly. Asymptomatic. Echo good   Neuro/Psych    GI/Hepatic   Endo/Other    Renal/GU Renal InsufficiencyRenal disease     Musculoskeletal   Abdominal   Peds  Hematology   Anesthesia Other Findings   Reproductive/Obstetrics                           Anesthesia Physical Anesthesia Plan  ASA: III  Anesthesia Plan: General   Post-op Pain Management:    Induction: Intravenous  Airway Management Planned: Oral ETT  Additional Equipment:   Intra-op Plan:   Post-operative Plan: Extubation in OR  Informed Consent: I have reviewed the patients History and Physical, chart, labs and discussed the procedure including the risks, benefits and alternatives for the proposed anesthesia with the patient or authorized representative who has indicated his/her understanding and acceptance.     Plan Discussed with: CRNA and Surgeon  Anesthesia Plan Comments:         Anesthesia Quick Evaluation

## 2013-07-07 NOTE — Anesthesia Postprocedure Evaluation (Signed)
Anesthesia Post Note  Patient: Peter Clarke  Procedure(s) Performed: Procedure(s) (LRB): TONSILLECTOMY (Left) DIRECT LARYNGOSCOPY WITH BIOPSY (N/A)  Anesthesia type: general  Patient location: PACU  Post pain: Pain level controlled  Post assessment: Patient's Cardiovascular Status Stable  Last Vitals:  Filed Vitals:   07/07/13 1400  BP:   Pulse: 75  Temp:   Resp: 15    Post vital signs: Reviewed and stable  Level of consciousness: sedated  Complications: No apparent anesthesia complications

## 2013-07-08 ENCOUNTER — Encounter (HOSPITAL_BASED_OUTPATIENT_CLINIC_OR_DEPARTMENT_OTHER): Payer: Self-pay | Admitting: Otolaryngology

## 2013-07-08 NOTE — Op Note (Deleted)
NAME:  Peter Clarke, Peter Clarke             ACCOUNT NO.:  [id number]  MEDICAL RECORD NO.:  [id number]  LOCATION:  RAD                          FACILITY:  [facility]  PHYSICIAN:  Newman Pies, MD            DATE OF BIRTH:  08-Jun-1946  DATE OF PROCEDURE:  07/07/2013 DATE OF DISCHARGE:  07/07/2013                              OPERATIVE REPORT   PREOPERATIVE DIAGNOSIS:  Left tonsillar mass.  POSTOP DIAGNOSIS:  Left tonsillar mass.  PROCEDURE PERFORMED: 1. Left tonsillectomy. 2. Direct laryngoscopy.  ANESTHESIA:  General endotracheal tube anesthesia.  COMPLICATIONS:  None.  ESTIMATED BLOOD LOSS:  Minimal.  INDICATION FOR PROCEDURE:  The patient is a 67 year old male who was seen last week with a complaint of left neck discomfort.  On examination, he was noted to have a large left tonsillar mass.  Based on the above findings, the decision was made for the patient to undergo direct laryngoscopy and tonsillectomy procedure.  The risks, benefits, alternatives, and details of the procedure were discussed with the patient.  Questions were invited and answered.  Informed consent was obtained.  DESCRIPTION OF PROCEDURE:  The patient was taken to the operating room and placed supine on the operating table.  General endotracheal tube anesthesia was administered by the anesthesiologist.  The patient was positioned and prepped and draped in a standard fashion for direct laryngoscopy.  Palpation of the neck revealed no obvious lymphadenopathy.  A Dedo laryngoscope was inserted via the oral cavity into the pharynx.  A large left tonsillar mass was noted.  The mass measured approximately 3 cm in size.  No abnormality was noted on the right side.  The vallecula, epiglottis, aryepiglottic folds, piriform sinus, arytenoids, and the vocal cords were all normal.  No other suspicious mass or lesion was noted.  The Dedo laryngoscope was withdrawn.  A Crowe-Davis mouth gag was inserted into oral cavity for exposure.   The left tonsillar mass was then grasped with Allis clamp and retracted medially.  It should be noted that the mass was fixated through the surrounding soft tissue and the palate.  Using the Coblator, a large portion of the mass was surgically removed.  Multiple biopsy specimens were also obtained from the surrounding soft tissue structures. Hemostasis was achieved with the Coblator device.  The mouth gag was removed.  The care of the patient was turned over to the anesthesiologist.  The patient was awakened from anesthesia without difficulty.  He was extubated and transferred to the recovery room in good condition.  OPERATIVE FINDINGS:  A large 3-cm left tonsillar mass was noted.  SPECIMEN:  Left tonsillar mass.  FOLLOWUP CARE:  The patient will be discharged home once he is awake and alert.  He will be placed on Roxicet p.r.n. pain and amoxicillin p.o. b.i.d. for 5 days.  The patient will follow up in my office in 1 week.     Newman Pies, MD     ST/MEDQ  D:  07/07/2013  T:  07/08/2013  Job:  [id number]

## 2013-07-08 NOTE — Op Note (Signed)
NAME:  Peter Clarke, Peter Clarke             ACCOUNT NO.:  [id number]  MEDICAL RECORD NO.:  [id number]  LOCATION:  RAD                          FACILITY:  [facility]  PHYSICIAN:  Mabrey Howland, MD            DATE OF BIRTH:  08/01/1946  DATE OF PROCEDURE:  07/07/2013 DATE OF DISCHARGE:  07/07/2013                              OPERATIVE REPORT   PREOPERATIVE DIAGNOSIS:  Left tonsillar mass.  POSTOP DIAGNOSIS:  Left tonsillar mass.  PROCEDURE PERFORMED: 1. Left tonsillectomy. 2. Direct laryngoscopy.  ANESTHESIA:  General endotracheal tube anesthesia.  COMPLICATIONS:  None.  ESTIMATED BLOOD LOSS:  Minimal.  INDICATION FOR PROCEDURE:  The patient is a 66-year-old male who was seen last week with a complaint of left neck discomfort.  On examination, he was noted to have a large left tonsillar mass.  Based on the above findings, the decision was made for the patient to undergo direct laryngoscopy and tonsillectomy procedure.  The risks, benefits, alternatives, and details of the procedure were discussed with the patient.  Questions were invited and answered.  Informed consent was obtained.  DESCRIPTION OF PROCEDURE:  The patient was taken to the operating room and placed supine on the operating table.  General endotracheal tube anesthesia was administered by the anesthesiologist.  The patient was positioned and prepped and draped in a standard fashion for direct laryngoscopy.  Palpation of the neck revealed no obvious lymphadenopathy.  A Dedo laryngoscope was inserted via the oral cavity into the pharynx.  A large left tonsillar mass was noted.  The mass measured approximately 3 cm in size.  No abnormality was noted on the right side.  The vallecula, epiglottis, aryepiglottic folds, piriform sinus, arytenoids, and the vocal cords were all normal.  No other suspicious mass or lesion was noted.  The Dedo laryngoscope was withdrawn.  A Crowe-Davis mouth gag was inserted into oral cavity for exposure.   The left tonsillar mass was then grasped with Allis clamp and retracted medially.  It should be noted that the mass was fixated through the surrounding soft tissue and the palate.  Using the Coblator, a large portion of the mass was surgically removed.  Multiple biopsy specimens were also obtained from the surrounding soft tissue structures. Hemostasis was achieved with the Coblator device.  The mouth gag was removed.  The care of the patient was turned over to the anesthesiologist.  The patient was awakened from anesthesia without difficulty.  He was extubated and transferred to the recovery room in good condition.  OPERATIVE FINDINGS:  A large 3-cm left tonsillar mass was noted.  SPECIMEN:  Left tonsillar mass.  FOLLOWUP CARE:  The patient will be discharged home once he is awake and alert.  He will be placed on Roxicet p.r.n. pain and amoxicillin p.o. b.i.d. for 5 days.  The patient will follow up in my office in 1 week.     Railey Glad, MD     ST/MEDQ  D:  07/07/2013  T:  07/08/2013  Job:  [id number] 

## 2013-07-17 ENCOUNTER — Ambulatory Visit (INDEPENDENT_AMBULATORY_CARE_PROVIDER_SITE_OTHER): Payer: Medicare Other | Admitting: Otolaryngology

## 2013-07-17 DIAGNOSIS — C099 Malignant neoplasm of tonsil, unspecified: Secondary | ICD-10-CM

## 2013-07-23 ENCOUNTER — Encounter (HOSPITAL_COMMUNITY): Payer: Medicare Other | Attending: Oncology

## 2013-07-23 ENCOUNTER — Ambulatory Visit: Payer: Self-pay | Admitting: Oncology

## 2013-07-23 ENCOUNTER — Encounter (HOSPITAL_COMMUNITY): Payer: Self-pay

## 2013-07-23 VITALS — BP 134/88 | HR 87 | Temp 98.6°F | Resp 16 | Ht 71.0 in | Wt 178.0 lb

## 2013-07-23 DIAGNOSIS — C760 Malignant neoplasm of head, face and neck: Secondary | ICD-10-CM

## 2013-07-23 DIAGNOSIS — C099 Malignant neoplasm of tonsil, unspecified: Secondary | ICD-10-CM

## 2013-07-23 NOTE — Progress Notes (Signed)
Demographics, diagnostics faxed [phone]) to Misty Stanley in [facility] office - confirmation of fax rc'd.  Patient's contact #'s updated in [facility].

## 2013-07-23 NOTE — Patient Instructions (Signed)
[facility] Discharge Instructions  RECOMMENDATIONS MADE BY THE CONSULTANT AND ANY TEST RESULTS WILL BE SENT TO YOUR REFERRING PHYSICIAN.  EXAM FINDINGS BY THE PHYSICIAN TODAY AND SIGNS OR SYMPTOMS TO REPORT TO CLINIC OR PRIMARY PHYSICIAN: Exam findings as discussed by [name].  Your care has been transferred to the [facility] campus under [name] - Please reach out to [place] in the future if we can be of any assistance.  Thank you for choosing [facility] to provide your oncology and hematology care.  To afford each patient quality time with our providers, please arrive at least 15 minutes before your scheduled appointment time.  With your help, our goal is to use those 15 minutes to complete the necessary work-up to ensure our physicians have the information they need to help with your evaluation and healthcare recommendations.    Effective January 1st, 2014, we ask that you re-schedule your appointment with our physicians should you arrive 10 or more minutes late for your appointment.  We strive to give you quality time with our providers, and arriving late affects you and other patients whose appointments are after yours.    Again, thank you for choosing [facility].  Our hope is that these requests will decrease the amount of time that you wait before being seen by our physicians.       _____________________________________________________________  Should you have questions after your visit to [facility], please contact our office at [phone] between the hours of 8:30 a.m. and 5:00 p.m.  Voicemails left after 4:30 p.m. will not be returned until the following business day.  For prescription refill requests, have your pharmacy contact our office with your prescription refill request.

## 2013-07-26 DIAGNOSIS — C760 Malignant neoplasm of head, face and neck: Secondary | ICD-10-CM | POA: Insufficient documentation

## 2013-07-26 NOTE — Progress Notes (Signed)
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  Until recently he has felt well.  He did not increasing lump on the left side of his throat for the last several months.  He also noted occasional tenderness and hoarseness of voice.  He denied any dysphasia.  He otherwise has felt well.  He has no neurologic complaints.  He denies any recent fevers or illnesses. He has a good appetite and denies weight loss.  He has no nausea, vomiting, constipation, or diarrhea.  He has no urinary complaints.  Patient otherwise feels well and offers no further specific complaints.  Active Ambulatory Problems    Diagnosis Date Noted  . Heart transplanted 06/17/2013   Resolved Ambulatory Problems    Diagnosis Date Noted  . No Resolved Ambulatory Problems   Past Medical History  Diagnosis Date  . Hypertension   . H/O heart transplant 2003  . Wears glasses   . Wears dentures   . Renal insufficiency   . Cancer    Current outpatient prescriptions:amLODipine (NORVASC) 5 MG tablet, Take 5 mg by mouth daily., Disp: , Rfl: ;  azaTHIOprine (IMURAN) 50 MG tablet, Take 3 every day, Disp: , Rfl: ;  carvedilol (COREG) 25 MG tablet, Take 25 mg by mouth. Take 2 a day, Disp: , Rfl: ;  Multiple Vitamins-Minerals (CENTRUM SILVER PO), Take by mouth., Disp: , Rfl: ;  predniSONE (DELTASONE) 5 MG tablet, Take 5 mg by mouth daily. , Disp: , Rfl:  tacrolimus (PROGRAF) 1 MG capsule, Take 1 mg by mouth. Take 4 every day, Disp: , Rfl: ;  oxyCODONE-acetaminophen (ROXICET) 5-325 MG/5ML solution, Take 5-10 mLs by mouth every 4 (four) hours as needed for pain., Disp: 300 mL, Rfl: 0  As per HPI. Otherwise, 10 point system review was negative.  Filed Vitals:   07/23/13 1106  BP: 134/88  Pulse: 87  Temp: 98.6 F (37 C)  Resp: 16   General:  Well-developed, well-nourished, no acute distress. Eyes: Pink conjunctiva, anicteric sclera. HEENT: Normocephalic, moist mucous membranes, clear oropharnyx. Lungs: Clear to auscultation bilaterally. Heart: Regular rate and rhythm. No rubs, murmurs, or gallops. Abdomen: Soft, nontender, nondistended. No organomegaly noted, normoactive bowel sounds. Musculoskeletal: No edema, cyanosis, or clubbing. Neuro: Alert, answering all questions appropriately. Cranial nerves grossly intact. Skin: No rashes or petechiae noted. Psych: Normal affect. Lymphatics: No cervical, calvicular, axillary or inguinal LAD  Assessment and plan:  1.  Stage III tonsil cancer: Given patient's history of cardiac transplant and immunosuppressant drugs, would be hesitant to use cytotoxic chemotherapy.  Patient will definitely benefit from XRT along with concurrent cetuximab.  He will have consultation with Dr. Rushie Chestnut at [facility] next week.  We will also plan a PET scan for the same day for staging purposes.  Patient would then return to clinic approximately 2 weeks later to initiate concurrent XRT and cetuximab.  Patient expressed understanding and was in agreement with this plan.

## 2013-07-28 ENCOUNTER — Ambulatory Visit: Payer: Self-pay | Admitting: Oncology

## 2013-08-18 ENCOUNTER — Ambulatory Visit: Payer: Self-pay | Admitting: Oncology

## 2013-08-19 LAB — COMPREHENSIVE METABOLIC PANEL
Albumin: 3.7 g/dL (ref 3.4–5.0)
BUN: 37 mg/dL — ABNORMAL HIGH (ref 7–18)
Chloride: 102 mmol/L (ref 98–107)
EGFR (African American): 36 — ABNORMAL LOW
EGFR (Non-African Amer.): 31 — ABNORMAL LOW
Glucose: 105 mg/dL — ABNORMAL HIGH (ref 65–99)
Potassium: 4.4 mmol/L (ref 3.5–5.1)
SGPT (ALT): 32 U/L (ref 12–78)

## 2013-08-19 LAB — CBC CANCER CENTER
Eosinophil %: 1.1 %
HCT: 36.1 % — ABNORMAL LOW (ref 40.0–52.0)
Lymphocyte #: 0.4 x10 3/mm — ABNORMAL LOW (ref 1.0–3.6)
Lymphocyte %: 5.9 %
MCHC: 34 g/dL (ref 32.0–36.0)
MCV: 102 fL — ABNORMAL HIGH (ref 80–100)
Monocyte %: 10.7 %
Platelet: 201 x10 3/mm (ref 150–440)
RDW: 14 % (ref 11.5–14.5)
WBC: 7.3 x10 3/mm (ref 3.8–10.6)

## 2013-08-20 ENCOUNTER — Emergency Department (HOSPITAL_COMMUNITY): Payer: Medicare Other

## 2013-08-20 ENCOUNTER — Encounter (HOSPITAL_COMMUNITY): Payer: Self-pay | Admitting: *Deleted

## 2013-08-20 ENCOUNTER — Emergency Department (HOSPITAL_COMMUNITY)
Admission: EM | Admit: 2013-08-20 | Discharge: 2013-08-20 | Disposition: A | Discharge status: Home / Self Care | Payer: Medicare Other | Attending: Emergency Medicine | Admitting: Emergency Medicine

## 2013-08-20 DIAGNOSIS — F17200 Nicotine dependence, unspecified, uncomplicated: Secondary | ICD-10-CM | POA: Insufficient documentation

## 2013-08-20 DIAGNOSIS — I10 Essential (primary) hypertension: Secondary | ICD-10-CM | POA: Insufficient documentation

## 2013-08-20 DIAGNOSIS — Z79899 Other long term (current) drug therapy: Secondary | ICD-10-CM | POA: Insufficient documentation

## 2013-08-20 DIAGNOSIS — Z85819 Personal history of malignant neoplasm of unspecified site of lip, oral cavity, and pharynx: Secondary | ICD-10-CM | POA: Insufficient documentation

## 2013-08-20 DIAGNOSIS — IMO0002 Reserved for concepts with insufficient information to code with codable children: Secondary | ICD-10-CM | POA: Insufficient documentation

## 2013-08-20 DIAGNOSIS — R51 Headache: Secondary | ICD-10-CM | POA: Insufficient documentation

## 2013-08-20 DIAGNOSIS — Z87448 Personal history of other diseases of urinary system: Secondary | ICD-10-CM | POA: Insufficient documentation

## 2013-08-20 HISTORY — DX: Malignant neoplasm of pharynx, unspecified: C14.0

## 2013-08-20 LAB — BASIC METABOLIC PANEL
BUN: 27 mg/dL — ABNORMAL HIGH (ref 6–23)
Creatinine, Ser: 1.57 mg/dL — ABNORMAL HIGH (ref 0.50–1.35)
GFR calc Af Amer: 51 mL/min — ABNORMAL LOW (ref >90)
GFR calc non Af Amer: 44 mL/min — ABNORMAL LOW (ref >90)

## 2013-08-20 LAB — CBC WITH DIFFERENTIAL/PLATELET
Basophils Relative: 0 % (ref 0–1)
Eosinophils Absolute: 0.1 10*3/uL (ref 0.0–0.7)
HCT: 35.6 % — ABNORMAL LOW (ref 39.0–52.0)
Hemoglobin: 12.4 g/dL — ABNORMAL LOW (ref 13.0–17.0)
MCH: 35.1 pg — ABNORMAL HIGH (ref 26.0–34.0)
MCHC: 34.8 g/dL (ref 30.0–36.0)
Monocytes Absolute: 0.7 10*3/uL (ref 0.1–1.0)
Monocytes Relative: 8 % (ref 3–12)

## 2013-08-20 MED ORDER — METOCLOPRAMIDE HCL 5 MG/ML IJ SOLN
10.0000 mg | Freq: Once | INTRAMUSCULAR | Status: AC
  Administered 2013-08-20: 10 mg via INTRAVENOUS
  Filled 2013-08-20: qty 2

## 2013-08-20 MED ORDER — ACETAMINOPHEN 325 MG PO TABS
650.0000 mg | ORAL_TABLET | Freq: Once | ORAL | Status: AC
  Administered 2013-08-20: 650 mg via ORAL
  Filled 2013-08-20: qty 1

## 2013-08-20 MED ORDER — OXYCODONE-ACETAMINOPHEN 5-325 MG PO TABS: 1.0000 | ORAL_TABLET | Freq: Four times a day (QID) | ORAL | Status: DC | PRN

## 2013-08-20 MED ORDER — ONDANSETRON HCL 4 MG/2ML IJ SOLN
4.0000 mg | Freq: Once | INTRAMUSCULAR | Status: AC
  Administered 2013-08-20: 4 mg via INTRAVENOUS
  Filled 2013-08-20: qty 2

## 2013-08-20 MED ORDER — DIPHENHYDRAMINE HCL 50 MG/ML IJ SOLN
25.0000 mg | Freq: Once | INTRAMUSCULAR | Status: AC
  Administered 2013-08-20: 25 mg via INTRAVENOUS
  Filled 2013-08-20: qty 1

## 2013-08-20 MED ORDER — HYDROMORPHONE HCL PF 1 MG/ML IJ SOLN
0.5000 mg | Freq: Once | INTRAMUSCULAR | Status: AC
  Administered 2013-08-20: 0.5 mg via INTRAVENOUS
  Filled 2013-08-20: qty 1

## 2013-08-20 MED ORDER — KETOROLAC TROMETHAMINE 30 MG/ML IJ SOLN
30.0000 mg | Freq: Once | INTRAMUSCULAR | Status: AC
  Administered 2013-08-20: 30 mg via INTRAVENOUS
  Filled 2013-08-20: qty 1

## 2013-08-20 NOTE — ED Provider Notes (Signed)
CSN: [id number]     Arrival date & time 08/20/13  1047 History  This chart was scribed for Benny Lennert, MD by Quintella Reichert, ED scribe.  This patient was seen in room APA18/APA18 and the patient's care was started at 11:02 AM.   Chief Complaint  Patient presents with  . Headache   Patient is a 67 y.o. male presenting with headaches. The history is provided by the patient. No language interpreter was used.  Headache Pain location:  Generalized Radiates to:  Does not radiate Pain severity now: Severe. Duration: Began last night. Timing:  Constant Chronicity:  New Similar to prior headaches: no   Context comment:  Received first chemo treatment several hours prior to onset Associated symptoms: no abdominal pain, no back pain, no congestion, no cough, no diarrhea, no fatigue, no seizures and no sinus pressure     HPI Comments: COSTAS SENA is a 67 y.o. 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  Pt denies prior h/o similar headaches.  He notes that earlier last night he had returned home from [facility] after receiving his first chemotherapy treatment for recently-diagnosed throat cancer.  He did not stay overnight and has not received surgery.   Past Medical History  Diagnosis Date  . Hypertension   . H/O heart transplant 2003  . Wears glasses   . Wears dentures     upper  . Renal insufficiency   . Cancer     tonsil  . Throat cancer     Past Surgical History  Procedure Laterality Date  . Heart transplant N/A 2003    [facility]-  . Appendectomy    . Colonoscopy    . Hernia repair  2008    rt ing  . Cardiac catheterization  2011  . Tonsillectomy Left 07/07/2013    Procedure: TONSILLECTOMY;  Surgeon: Darletta Moll, MD;  Location: [facility];  Service: ENT;  Laterality: Left;  .  Direct laryngoscopy N/A 07/07/2013    Procedure: DIRECT LARYNGOSCOPY WITH BIOPSY;  Surgeon: Darletta Moll, MD;  Location: [facility];  Service: ENT;  Laterality: N/A;    No family history on file.   History  Substance Use Topics  . 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 Smokeless tobacco: Never Used  . Alcohol Use: No     Review of Systems  Constitutional: Negative for appetite change and fatigue.  HENT: Negative for congestion, sinus pressure and ear discharge.   Eyes: Negative for discharge.  Respiratory: Negative for cough.   Cardiovascular: Negative for chest pain.  Gastrointestinal: Negative for abdominal pain and diarrhea.  Genitourinary: Negative for frequency and hematuria.  Musculoskeletal: Negative for back pain.  Skin: Negative for rash.  Neurological: Positive for headaches. Negative for seizures.  Psychiatric/Behavioral: Negative for hallucinations.     Allergies  Pepto-bismol  Home Medications   Current Outpatient Rx  Name  Route  Sig  Dispense  Refill  . amLODipine (NORVASC) 5 MG tablet   Oral   Take 5 mg by mouth daily.         Marland Kitchen azaTHIOprine (IMURAN) 50 MG tablet      Take 3 every day         . carvedilol (COREG) 25 MG tablet  Oral   Take 25 mg by mouth. Take 2 a day         . Multiple Vitamins-Minerals (CENTRUM SILVER PO)   Oral   Take by mouth.         . oxyCODONE-acetaminophen (ROXICET) 5-325 MG/5ML solution   Oral   Take 5-10 mLs by mouth every 4 (four) hours as needed for pain.   300 mL   0   . predniSONE (DELTASONE) 5 MG tablet   Oral   Take 5 mg by mouth daily.          . tacrolimus (PROGRAF) 1 MG capsule   Oral   Take 1 mg by mouth. Take 4 every day          BP 133/91  Pulse 88  Temp(Src) 100.2 F (37.9 C) (Oral)  Resp 18  Ht 6' (1.829 m)  Wt 176 lb (79.833 kg)  BMI 23.86 kg/m2  SpO2 97%  Physical Exam  Constitutional: He is oriented to  person, place, and time. He appears well-developed.  HENT:  Head: Normocephalic.  Mouth/Throat: Mucous membranes are dry (severe).  Eyes: Conjunctivae and EOM are normal. No scleral icterus.  Neck: Neck supple. No thyromegaly present.  Cardiovascular: Normal rate and regular rhythm.  Exam reveals no gallop and no friction rub.   No murmur heard. Pulmonary/Chest: No stridor. He has no wheezes. He has no rales. He exhibits no tenderness.  Abdominal: He exhibits no distension. There is no tenderness. There is no rebound.  Musculoskeletal: Normal range of motion. He exhibits no edema.  Lymphadenopathy:    He has no cervical adenopathy.  Neurological: He is oriented to person, place, and time. Coordination normal.  Skin: No rash noted. No erythema.  Psychiatric: He has a normal mood and affect. His behavior is normal.    ED Course  Procedures (including critical care time)  DIAGNOSTIC STUDIES: Oxygen Saturation is 97% on room air, normal by my interpretation.    COORDINATION OF CARE: 11:05 AM-Discussed treatment plan which includes labs, imaging and pain medication with pt at bedside and pt agreed to plan.    Labs Review Labs Reviewed  CBC WITH DIFFERENTIAL - Abnormal; Notable for the following:    RBC 3.53 (*)    Hemoglobin 12.4 (*)    HCT 35.6 (*)    MCV 100.8 (*)    MCH 35.1 (*)    Neutrophils Relative % 79 (*)    All other components within normal limits  BASIC METABOLIC PANEL - Abnormal; Notable for the following:    Glucose, Bld 134 (*)    BUN 27 (*)    Creatinine, Ser 1.57 (*)    GFR calc non Af Amer 44 (*)    GFR calc Af Amer 51 (*)    All other components within normal limits    Imaging Review Dg Chest 1 View  08/20/2013   CLINICAL DATA:  Headache, hypertension.  EXAM: CHEST - 1 VIEW  COMPARISON:  12/28/2010  FINDINGS: Prior CABG. Mild hyperinflation of the lungs without confluent opacity, effusion or acute bony abnormality. Heart is normal size.  IMPRESSION: Mild  hyperinflation. No acute findings.   Electronically Signed   By: Charlett Nose   On: 08/20/2013 12:03   Ct Head Wo Contrast  08/20/2013   *RADIOLOGY REPORT*  Clinical Data: Altered mental status.  Weakness.  CT HEAD WITHOUT CONTRAST  Technique:  Contiguous axial images were obtained from the base of the skull through the vertex without contrast.  Comparison: None.  Findings: No evidence of acute intracranial abnormality including hemorrhage, infarct, mass lesion, mass effect, midline shift or abnormal extra-axial fluid collection is identified.  There is no hydrocephalus or pneumocephalus.  The calvarium is intact.  Mucous retention cyst or polyp and short air fluid level in the right maxillary sinus are identified.  IMPRESSION:  1.  No acute abnormality. 2.  Right maxillary sinus disease.   Original Report Authenticated By: Holley Dexter, M.D.    MDM  No diagnosis found. Headache improved with tx.  Pt to follow up this week  The chart was scribed for me under my direct supervision.  I personally performed the history, physical, and medical decision making and all procedures in the evaluation of this patient.Benny Lennert, MD 08/20/13 [phone]

## 2013-08-20 NOTE — ED Notes (Addendum)
EMS call for headache. Began last night. Decreased appetite. Received NS enroute IV. Began chemo at [facility] yesterday for throat cancer. Denies N/V

## 2013-08-26 LAB — CBC CANCER CENTER
Basophil #: 0 x10 3/mm (ref 0.0–0.1)
Basophil %: 0.6 %
Eosinophil #: 0.1 x10 3/mm (ref 0.0–0.7)
Lymphocyte #: 0.4 x10 3/mm — ABNORMAL LOW (ref 1.0–3.6)
MCH: 34.7 pg — ABNORMAL HIGH (ref 26.0–34.0)
MCHC: 34.2 g/dL (ref 32.0–36.0)
MCV: 102 fL — ABNORMAL HIGH (ref 80–100)
Monocyte #: 0.8 x10 3/mm (ref 0.2–1.0)
Monocyte %: 11.2 %
Neutrophil #: 5.5 x10 3/mm (ref 1.4–6.5)
Platelet: 225 x10 3/mm (ref 150–440)
RBC: 3.57 10*6/uL — ABNORMAL LOW (ref 4.40–5.90)
RDW: 13.6 % (ref 11.5–14.5)

## 2013-08-26 LAB — COMPREHENSIVE METABOLIC PANEL
Albumin: 3.6 g/dL (ref 3.4–5.0)
Alkaline Phosphatase: 82 U/L (ref 50–136)
Anion Gap: 7 (ref 7–16)
Calcium, Total: 9.2 mg/dL (ref 8.5–10.1)
Chloride: 102 mmol/L (ref 98–107)
Co2: 30 mmol/L (ref 21–32)
EGFR (Non-African Amer.): 35 — ABNORMAL LOW
Glucose: 99 mg/dL (ref 65–99)
Osmolality: 286 (ref 275–301)
SGOT(AST): 12 U/L — ABNORMAL LOW (ref 15–37)
SGPT (ALT): 25 U/L (ref 12–78)
Sodium: 139 mmol/L (ref 136–145)
Total Protein: 7.3 g/dL (ref 6.4–8.2)

## 2013-09-02 LAB — CBC CANCER CENTER
Basophil #: 0 x10 3/mm (ref 0.0–0.1)
Eosinophil #: 0 x10 3/mm (ref 0.0–0.7)
Eosinophil %: 0.8 %
HCT: 34.3 % — ABNORMAL LOW (ref 40.0–52.0)
HGB: 11.9 g/dL — ABNORMAL LOW (ref 13.0–18.0)
Lymphocyte %: 5.4 %
MCH: 34.7 pg — ABNORMAL HIGH (ref 26.0–34.0)
Neutrophil #: 4.9 x10 3/mm (ref 1.4–6.5)
Neutrophil %: 80.8 %
Platelet: 258 x10 3/mm (ref 150–440)
RBC: 3.44 10*6/uL — ABNORMAL LOW (ref 4.40–5.90)
RDW: 13.5 % (ref 11.5–14.5)
WBC: 6 x10 3/mm (ref 3.8–10.6)

## 2013-09-02 LAB — BASIC METABOLIC PANEL
Anion Gap: 9 (ref 7–16)
BUN: 31 mg/dL — ABNORMAL HIGH (ref 7–18)
Calcium, Total: 9.1 mg/dL (ref 8.5–10.1)
Co2: 29 mmol/L (ref 21–32)
Creatinine: 1.88 mg/dL — ABNORMAL HIGH (ref 0.60–1.30)
EGFR (Non-African Amer.): 36 — ABNORMAL LOW
Glucose: 110 mg/dL — ABNORMAL HIGH (ref 65–99)
Osmolality: 285 (ref 275–301)
Potassium: 4 mmol/L (ref 3.5–5.1)

## 2013-09-07 ENCOUNTER — Inpatient Hospital Stay (HOSPITAL_COMMUNITY)
Admission: EM | Admit: 2013-09-07 | Discharge: 2013-09-23 | DRG: 329 | Disposition: A | Discharge status: Other Acute Inpatient Hospital | Payer: Medicare Other | Attending: Internal Medicine | Admitting: Internal Medicine

## 2013-09-07 ENCOUNTER — Encounter (HOSPITAL_COMMUNITY): Payer: Self-pay | Admitting: *Deleted

## 2013-09-07 ENCOUNTER — Emergency Department (HOSPITAL_COMMUNITY): Payer: Medicare Other

## 2013-09-07 DIAGNOSIS — E871 Hypo-osmolality and hyponatremia: Secondary | ICD-10-CM | POA: Diagnosis not present

## 2013-09-07 DIAGNOSIS — R509 Fever, unspecified: Secondary | ICD-10-CM

## 2013-09-07 DIAGNOSIS — R404 Transient alteration of awareness: Secondary | ICD-10-CM | POA: Diagnosis not present

## 2013-09-07 DIAGNOSIS — F17200 Nicotine dependence, unspecified, uncomplicated: Secondary | ICD-10-CM | POA: Diagnosis present

## 2013-09-07 DIAGNOSIS — N179 Acute kidney failure, unspecified: Secondary | ICD-10-CM | POA: Diagnosis not present

## 2013-09-07 DIAGNOSIS — E440 Moderate protein-calorie malnutrition: Secondary | ICD-10-CM

## 2013-09-07 DIAGNOSIS — B370 Candidal stomatitis: Secondary | ICD-10-CM | POA: Diagnosis not present

## 2013-09-07 DIAGNOSIS — Z923 Personal history of irradiation: Secondary | ICD-10-CM

## 2013-09-07 DIAGNOSIS — B9689 Other specified bacterial agents as the cause of diseases classified elsewhere: Secondary | ICD-10-CM | POA: Diagnosis not present

## 2013-09-07 DIAGNOSIS — N183 Chronic kidney disease, stage 3 unspecified: Secondary | ICD-10-CM

## 2013-09-07 DIAGNOSIS — D638 Anemia in other chronic diseases classified elsewhere: Secondary | ICD-10-CM | POA: Diagnosis present

## 2013-09-07 DIAGNOSIS — K5780 Diverticulitis of intestine, part unspecified, with perforation and abscess without bleeding: Secondary | ICD-10-CM

## 2013-09-07 DIAGNOSIS — Z941 Heart transplant status: Secondary | ICD-10-CM

## 2013-09-07 DIAGNOSIS — C099 Malignant neoplasm of tonsil, unspecified: Secondary | ICD-10-CM | POA: Diagnosis present

## 2013-09-07 DIAGNOSIS — R627 Adult failure to thrive: Secondary | ICD-10-CM | POA: Diagnosis not present

## 2013-09-07 DIAGNOSIS — C760 Malignant neoplasm of head, face and neck: Secondary | ICD-10-CM | POA: Diagnosis present

## 2013-09-07 DIAGNOSIS — K651 Peritoneal abscess: Secondary | ICD-10-CM

## 2013-09-07 DIAGNOSIS — D849 Immunodeficiency, unspecified: Secondary | ICD-10-CM

## 2013-09-07 DIAGNOSIS — Z515 Encounter for palliative care: Secondary | ICD-10-CM

## 2013-09-07 DIAGNOSIS — I5022 Chronic systolic (congestive) heart failure: Secondary | ICD-10-CM

## 2013-09-07 DIAGNOSIS — D72829 Elevated white blood cell count, unspecified: Secondary | ICD-10-CM | POA: Diagnosis present

## 2013-09-07 DIAGNOSIS — D899 Disorder involving the immune mechanism, unspecified: Secondary | ICD-10-CM | POA: Diagnosis present

## 2013-09-07 DIAGNOSIS — K5732 Diverticulitis of large intestine without perforation or abscess without bleeding: Secondary | ICD-10-CM

## 2013-09-07 DIAGNOSIS — I5032 Chronic diastolic (congestive) heart failure: Secondary | ICD-10-CM

## 2013-09-07 DIAGNOSIS — Z9221 Personal history of antineoplastic chemotherapy: Secondary | ICD-10-CM

## 2013-09-07 DIAGNOSIS — I129 Hypertensive chronic kidney disease with stage 1 through stage 4 chronic kidney disease, or unspecified chronic kidney disease: Secondary | ICD-10-CM | POA: Diagnosis present

## 2013-09-07 DIAGNOSIS — Z01810 Encounter for preprocedural cardiovascular examination: Secondary | ICD-10-CM

## 2013-09-07 DIAGNOSIS — D62 Acute posthemorrhagic anemia: Secondary | ICD-10-CM | POA: Diagnosis not present

## 2013-09-07 DIAGNOSIS — K660 Peritoneal adhesions (postprocedural) (postinfection): Secondary | ICD-10-CM | POA: Diagnosis present

## 2013-09-07 DIAGNOSIS — I10 Essential (primary) hypertension: Secondary | ICD-10-CM

## 2013-09-07 DIAGNOSIS — R410 Disorientation, unspecified: Secondary | ICD-10-CM

## 2013-09-07 LAB — COMPREHENSIVE METABOLIC PANEL
ALT: 11 U/L (ref 0–53)
AST: 14 U/L (ref 0–37)
Albumin: 3.3 g/dL — ABNORMAL LOW (ref 3.5–5.2)
Alkaline Phosphatase: 54 U/L (ref 39–117)
BUN: 37 mg/dL — ABNORMAL HIGH (ref 6–23)
Chloride: 92 mEq/L — ABNORMAL LOW (ref 96–112)
Potassium: 4.1 mEq/L (ref 3.5–5.1)
Sodium: 130 mEq/L — ABNORMAL LOW (ref 135–145)
Total Bilirubin: 0.7 mg/dL (ref 0.3–1.2)

## 2013-09-07 LAB — CBC WITH DIFFERENTIAL/PLATELET
Basophils Relative: 0 % (ref 0–1)
Hemoglobin: 11.5 g/dL — ABNORMAL LOW (ref 13.0–17.0)
MCHC: 33.9 g/dL (ref 30.0–36.0)
Monocytes Relative: 6 % (ref 3–12)
Neutro Abs: 12 10*3/uL — ABNORMAL HIGH (ref 1.7–7.7)
Neutrophils Relative %: 91 % — ABNORMAL HIGH (ref 43–77)
Platelets: 240 10*3/uL (ref 150–400)
RBC: 3.4 MIL/uL — ABNORMAL LOW (ref 4.22–5.81)

## 2013-09-07 LAB — CG4 I-STAT (LACTIC ACID): Lactic Acid, Venous: 1.45 mmol/L (ref 0.5–2.2)

## 2013-09-07 LAB — MRSA PCR SCREENING: MRSA by PCR: NEGATIVE

## 2013-09-07 MED ORDER — ONDANSETRON HCL 4 MG/2ML IJ SOLN
4.0000 mg | Freq: Once | INTRAMUSCULAR | Status: AC
  Administered 2013-09-07: 4 mg via INTRAVENOUS
  Filled 2013-09-07: qty 2

## 2013-09-07 MED ORDER — SODIUM CHLORIDE 0.9 % IJ SOLN
3.0000 mL | Freq: Two times a day (BID) | INTRAMUSCULAR | Status: DC
  Administered 2013-09-08 – 2013-09-21 (×13): 3 mL via INTRAVENOUS

## 2013-09-07 MED ORDER — AMLODIPINE BESYLATE 5 MG PO TABS
5.0000 mg | ORAL_TABLET | Freq: Every day | ORAL | Status: DC
  Administered 2013-09-08: 5 mg via ORAL
  Filled 2013-09-07: qty 1

## 2013-09-07 MED ORDER — SODIUM CHLORIDE 0.9 % IV BOLUS (SEPSIS)
1000.0000 mL | Freq: Once | INTRAVENOUS | Status: AC
  Administered 2013-09-07: 1000 mL via INTRAVENOUS

## 2013-09-07 MED ORDER — MORPHINE SULFATE 4 MG/ML IJ SOLN
4.0000 mg | INTRAMUSCULAR | Status: AC | PRN
  Administered 2013-09-07 – 2013-09-08 (×3): 4 mg via INTRAVENOUS
  Filled 2013-09-07 (×3): qty 1

## 2013-09-07 MED ORDER — HEPARIN SODIUM (PORCINE) 5000 UNIT/ML IJ SOLN
5000.0000 [IU] | Freq: Three times a day (TID) | INTRAMUSCULAR | Status: DC
  Administered 2013-09-08 – 2013-09-09 (×5): 5000 [IU] via SUBCUTANEOUS
  Filled 2013-09-07 (×8): qty 1

## 2013-09-07 MED ORDER — CARVEDILOL 25 MG PO TABS
25.0000 mg | ORAL_TABLET | Freq: Two times a day (BID) | ORAL | Status: DC
  Administered 2013-09-08: 25 mg via ORAL
  Filled 2013-09-07 (×3): qty 1

## 2013-09-07 MED ORDER — IOHEXOL 300 MG/ML  SOLN
50.0000 mL | Freq: Once | INTRAMUSCULAR | Status: AC | PRN
  Administered 2013-09-07: 50 mL via ORAL

## 2013-09-07 MED ORDER — SODIUM CHLORIDE 0.9 % IV SOLN
INTRAVENOUS | Status: DC
  Administered 2013-09-08 (×2): via INTRAVENOUS

## 2013-09-07 MED ORDER — AZATHIOPRINE 50 MG PO TABS
150.0000 mg | ORAL_TABLET | Freq: Every day | ORAL | Status: DC
  Administered 2013-09-08: 150 mg via ORAL
  Filled 2013-09-07: qty 3

## 2013-09-07 MED ORDER — TACROLIMUS 1 MG PO CAPS
4.0000 mg | ORAL_CAPSULE | Freq: Every day | ORAL | Status: DC
  Administered 2013-09-08: 2 mg via ORAL
  Filled 2013-09-07: qty 4

## 2013-09-07 MED ORDER — ACETAMINOPHEN 500 MG PO TABS
1000.0000 mg | ORAL_TABLET | Freq: Once | ORAL | Status: AC
  Administered 2013-09-07: 1000 mg via ORAL

## 2013-09-07 MED ORDER — METRONIDAZOLE IN NACL 5-0.79 MG/ML-% IV SOLN
500.0000 mg | Freq: Once | INTRAVENOUS | Status: AC
  Administered 2013-09-07: 500 mg via INTRAVENOUS
  Filled 2013-09-07: qty 100

## 2013-09-07 MED ORDER — ACETAMINOPHEN 500 MG PO TABS
ORAL_TABLET | ORAL | Status: AC
  Administered 2013-09-07: 1000 mg via ORAL
  Filled 2013-09-07: qty 2

## 2013-09-07 MED ORDER — SODIUM CHLORIDE 0.9 % IV SOLN
1.0000 g | INTRAVENOUS | Status: DC
  Administered 2013-09-07 – 2013-09-16 (×10): 1 g via INTRAVENOUS
  Filled 2013-09-07 (×11): qty 1

## 2013-09-07 MED ORDER — PIPERACILLIN-TAZOBACTAM 3.375 G IVPB
3.3750 g | Freq: Once | INTRAVENOUS | Status: AC
  Administered 2013-09-07: 3.375 g via INTRAVENOUS
  Filled 2013-09-07: qty 50

## 2013-09-07 MED ORDER — HYDROCORTISONE SOD SUCCINATE 100 MG IJ SOLR
100.0000 mg | Freq: Once | INTRAMUSCULAR | Status: AC
  Administered 2013-09-07: 100 mg via INTRAVENOUS
  Filled 2013-09-07: qty 2

## 2013-09-07 MED ORDER — PREDNISONE 5 MG PO TABS
5.0000 mg | ORAL_TABLET | Freq: Every day | ORAL | Status: DC
  Administered 2013-09-08 – 2013-09-23 (×16): 5 mg via ORAL
  Filled 2013-09-07 (×16): qty 1

## 2013-09-07 NOTE — ED Notes (Signed)
Patient medicated for fever. Dr Fayrene Fearing aware of fever.

## 2013-09-07 NOTE — ED Notes (Signed)
Patient is an immunocompromised male from home with c/o lower abdominal pain and nausea since yesterday. H/o Throat cancer with last chemotherapy session on Tuesday. H/o heart transplant. Patient alert/oriented to baseline. No active vomiting. Medicated for nausea/pain. Currently drinking oral contrast for CT.

## 2013-09-07 NOTE — ED Notes (Signed)
Dr Fonnie Jarvis aware of patient's low grade fever. Patient received tylenol 1 gram PO at 1304 today. No new orders.

## 2013-09-07 NOTE — Consult Note (Signed)
Reason for Consult:Diverticulitis with focal perforation Referring Physician: Penny Pia  Peter Clarke is an 67 y.o. male.  HPI: Pleasant 67 year old male developed lower abdominal pain yesterday evening. He has recently had some mild constipation. He is on a pain pill regimen by prescription. He took a pain pill and the pain eased off but it returned. The pain came and went through the night but was still quite bad this morning so he went to [facility] emergency department. Workup there revealed mild leukocytosis and CT scan of the abdomen and pelvis revealed sigmoid diverticulitis with focal perforation with some air surrounding the sigmoid colon. The patient has multiple medical problems including status post heart transplant 11 years ago. He is also on current immunosuppression therapy for that including Prograf and prednisone. 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 In light of these medical issues, he was accepted in transfer to the hospitalist service at [facility]. In general, his abdominal pain is improved compared with earlier today. He did not have significant discomfort during transport. He feels like he needs to have a bowel movement at this time. No other current complaints.  Past Medical History  Diagnosis Date  . Hypertension   . H/O heart transplant 2003  . Wears glasses   . Wears dentures     upper  . Renal insufficiency   . Cancer     tonsil  . Throat cancer     Past Surgical History  Procedure Laterality Date  . Heart transplant N/A 2003    [facility]-  . Appendectomy    . Colonoscopy    . Hernia repair  2008    rt ing  . Cardiac catheterization  2011  . Tonsillectomy Left 07/07/2013    Procedure: TONSILLECTOMY;  Surgeon: Darletta Moll, MD;  Location: [facility];  Service: ENT;  Laterality: Left;  . Direct laryngoscopy N/A 07/07/2013    Procedure: DIRECT  LARYNGOSCOPY WITH BIOPSY;  Surgeon: Darletta Moll, MD;  Location: [facility];  Service: ENT;  Laterality: N/A;    History reviewed. No pertinent family history.  Social History:  reports that he has been smoking Pipe.  He has never used smokeless tobacco. He reports that he does not drink alcohol or use illicit drugs.  Allergies:  Allergies  Allergen Reactions  . Pepto-Bismol [Bismuth Subsalicylate]     rash    Medications:  Prior to Admission:  Prescriptions prior to admission  Medication Sig Dispense Refill  . amLODipine (NORVASC) 5 MG tablet Take 5 mg by mouth daily.      Marland Kitchen azaTHIOprine (IMURAN) 50 MG tablet Take 50 mg by mouth 3 (three) times daily.       . carvedilol (COREG) 25 MG tablet Take 25 mg by mouth 2 (two) times daily.       . Multiple Vitamins-Minerals (CENTRUM SILVER PO) Take 1 tablet by mouth daily.       . predniSONE (DELTASONE) 5 MG tablet Take 5 mg by mouth daily.       . tacrolimus (PROGRAF) 1 MG capsule Take 4 mg by mouth daily.         Results for orders placed during the hospital encounter of 09/07/13 (from the past 48 hour(s))  CULTURE, BLOOD (ROUTINE X 2)     Status: None   Collection Time    09/07/13 11:41 AM      Result  Value Range   Specimen Description       Value: BLOOD BOTTLES DRAWN AEROBIC AND ANAEROBIC LEFT ANTECUBITAL   Special Requests 8CC     Culture PENDING     Report Status PENDING    CBC WITH DIFFERENTIAL     Status: Abnormal   Collection Time    09/07/13 11:42 AM      Result Value Range   WBC 13.2 (*) 4.0 - 10.5 K/uL   RBC 3.40 (*) 4.22 - 5.81 MIL/uL   Hemoglobin 11.5 (*) 13.0 - 17.0 g/dL   HCT 11.9 (*) 14.7 - 82.9 %   MCV 99.7  78.0 - 100.0 fL   MCH 33.8  26.0 - 34.0 pg   MCHC 33.9  30.0 - 36.0 g/dL   RDW 56.2  13.0 - 86.5 %   Platelets 240  150 - 400 K/uL   Neutrophils Relative % 91 (*) 43 - 77 %   Neutro Abs 12.0 (*) 1.7 - 7.7 K/uL   Lymphocytes Relative 3 (*) 12 - 46 %   Lymphs Abs 0.4 (*) 0.7 - 4.0 K/uL    Monocytes Relative 6  3 - 12 %   Monocytes Absolute 0.8  0.1 - 1.0 K/uL   Eosinophils Relative 0  0 - 5 %   Eosinophils Absolute 0.0  0.0 - 0.7 K/uL   Basophils Relative 0  0 - 1 %   Basophils Absolute 0.0  0.0 - 0.1 K/uL  COMPREHENSIVE METABOLIC PANEL     Status: Abnormal   Collection Time    09/07/13 11:42 AM      Result Value Range   Sodium 130 (*) 135 - 145 mEq/L   Potassium 4.1  3.5 - 5.1 mEq/L   Chloride 92 (*) 96 - 112 mEq/L   CO2 27  19 - 32 mEq/L   Glucose, Bld 102 (*) 70 - 99 mg/dL   BUN 37 (*) 6 - 23 mg/dL   Creatinine, Ser 7.84 (*) 0.50 - 1.35 mg/dL   Calcium 9.2  8.4 - 69.6 mg/dL   Total Protein 6.5  6.0 - 8.3 g/dL   Albumin 3.3 (*) 3.5 - 5.2 g/dL   AST 14  0 - 37 U/L   ALT 11  0 - 53 U/L   Alkaline Phosphatase 54  39 - 117 U/L   Total Bilirubin 0.7  0.3 - 1.2 mg/dL   GFR calc non Af Amer 32 (*) >90 mL/min   GFR calc Af Amer 37 (*) >90 mL/min   Comment: (NOTE)     The eGFR has been calculated using the CKD EPI equation.     This calculation has not been validated in all clinical situations.     eGFR's persistently <90 mL/min signify possible Chronic Kidney     Disease.  CULTURE, BLOOD (ROUTINE X 2)     Status: None   Collection Time    09/07/13 11:43 AM      Result Value Range   Specimen Description       Value: BLOOD BOTTLES DRAWN AEROBIC AND ANAEROBIC RIGHT ANTECUBITAL   Special Requests 12CC     Culture PENDING     Report Status PENDING    CG4 I-STAT (LACTIC ACID)     Status: None   Collection Time    09/07/13 11:47 AM      Result Value Range   Lactic Acid, Venous 1.45  0.5 - 2.2 mmol/L    Ct Abdomen Pelvis Wo Contrast  09/07/2013   *  RADIOLOGY REPORT*  Clinical Data: Low abdomen pain.  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  CT ABDOMEN AND PELVIS WITHOUT CONTRAST  Technique:  Multidetector CT imaging of the abdomen and pelvis was performed following the standard protocol without intravenous contrast.   Comparison: February 11, 2009.  Findings: There is abnormal thickening of the sigmoid colon with perforation.  There is free air surrounding the sigmoid colon with air extending superiorly. There is no small bowel obstruction.  There are several low density lesions within the liver, most are present on the prior CT of February 06, 2009.  Evaluation is limited without contrast.  The spleen, pancreas, gallbladder, adrenal glands are normal.  There is no nephrolithiasis or hydroureternephrosis  bilaterally.  There is atherosclerosis of the abdominal aorta without aneurysmal dilatation.  Images of the pelvis demonstrate partial fluid filled bladder without abnormality.  Left inguinal herniation of mesenteric fat is identified measuring 5.6 cm.  There is mild scarring of the posterior lung bases with mild dependent atelectasis of the right lung base. There is minimal right pleural effusion . Degenerative joint changes of the spine are noted.  IMPRESSION: Abnormal thickening of sigmoid colon with perforation. Differential diagnosis includes diverticulitis with perforation versus colon cancer with perforation.   Original Report Authenticated By: Sherian Rein, M.D.    Review of Systems  Constitutional: Positive for fever.       Low-grade temperature of 100 at home  HENT:       Current treatment for head and neck cancer  Eyes: Negative.   Respiratory: Negative.   Cardiovascular:       Status post heart transplant  Gastrointestinal: Positive for abdominal pain and constipation. Negative for nausea, vomiting and diarrhea.  Genitourinary: Negative.   Musculoskeletal: Negative.   Skin: Negative.   Neurological: Negative.   Endo/Heme/Allergies: Negative.   Psychiatric/Behavioral: Negative.    Blood pressure 94/55, pulse 75, temperature 100.7 F (38.2 C), temperature source Oral, resp. rate 18, SpO2 92.00%. Physical Exam  Constitutional: He is oriented to person, place, and time. He appears well-developed  and well-nourished. No distress.  HENT:  Head: Normocephalic and atraumatic.  Mild erythema of oral mucosa  Eyes: EOM are normal. Pupils are equal, round, and reactive to light. No scleral icterus.  Neck: Normal range of motion. Neck supple. No tracheal deviation present.  Cardiovascular: Normal rate, regular rhythm, normal heart sounds and intact distal pulses.   Respiratory: Effort normal. No stridor. No respiratory distress. He has no wheezes.  Midline scar on the chest extends over her sternum and down to the upper midline of abdomen  GI: Soft. He exhibits no distension and no mass. There is tenderness. There is no rebound and no guarding.  Scar as above, mild tenderness lower midline in midabdomen with no guarding, no focal nor generalized peritonitis, bowel sounds are active  Musculoskeletal: Normal range of motion. He exhibits no edema and no tenderness.  Neurological: He is alert and oriented to person, place, and time. He exhibits normal muscle tone.  Skin: Skin is warm.  Psychiatric: He has a normal mood and affect.    Assessment/Plan: Sigmoid diverticulitis with focal perforation. Patient is currently stable with only minimal tenderness. Recommend IV antibiotics and bowel rest. I will order Invanz. In light of the patient's immunocompromised state, we will have a low threshold for surgery if he does not improve. This would consist of sigmoid colectomy with colostomy. I discussed this in detail with  the patient and his wife. He is strongly hoping to avoid surgery. In addition, I feel cardiology consultation will be helpful in light of his heart transplant. Plan was discussed in detail the patient and his wife.  Jerusha Reising E 09/07/2013, 7:06 PM

## 2013-09-07 NOTE — Progress Notes (Signed)
Discussed case with Dr. Fayrene Fearing at [facility] Requested admission for patient [name] MRN [id number] at the request of the general surgeon given his multiple medical problems: Throat cancer diagnosed 2 months ago undergoing chemotherapy, h/o heart transplant in 2003,  HTN, and renal insufficiency.  Presented with abdominal discomfort. CT at ED of abdomen and pelvis showed Abnormal thickening of sigmoid colon with perforation. Differential diagnosis includes diverticulitis with perforation versus colon  cancer with perforation  General surgery has been consulted by ED and they will consult on patient here in [place].  Pt to go to stepdown and we are to be paged once patient arrives.  Shelley Pooley, [employer]

## 2013-09-07 NOTE — H&P (Signed)
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 at [facility] currently undergoing chemotherapy and radiation therapy, CKD stage 3, presents with abdominal pain.  He states his pain begun yesterday evening and "just him me".  He admits to some slight diarrhea, but no melena or hematochezia. He denies any N/V.   He was transferred from [facility] due to findings of abnormal thickening of sigmoid colon with perforation.  He has been evaluated by Dr. [name] of surgery with plans to treat conservatively with bowel rest and IV abx initially, but with a low threshold for surgery. He is noted to have a WBC of 13k. He is also noted to have a Cr of 2.06, increased from 1.57 on 9/3.  Past Medical History  Diagnosis Date  . Hypertension   . H/O heart transplant 2003  . Wears glasses   . Wears dentures     upper  . Renal insufficiency   . Cancer     tonsil  . Throat cancer     Past Surgical History  Procedure Laterality Date  . Heart transplant N/A 2003    [facility]-  . Appendectomy    . Colonoscopy    . Hernia repair  2008    rt ing  . Cardiac catheterization  2011  . Tonsillectomy Left 07/07/2013    Procedure: TONSILLECTOMY;  Surgeon: Darletta Moll, MD;  Location: [facility];  Service: ENT;  Laterality: Left;  . Direct laryngoscopy N/A 07/07/2013    Procedure: DIRECT LARYNGOSCOPY WITH BIOPSY;  Surgeon: Darletta Moll, MD;  Location:  SURGERY CENTER;  Service: ENT;  Laterality: N/A;    History reviewed. No pertinent family history. Social History:  reports that he has been smoking Pipe.  He has never used smokeless tobacco. He reports that he does not drink alcohol or use illicit drugs.  Allergies:  Allergies  Allergen Reactions  . Pepto-Bismol [Bismuth Subsalicylate]     rash     Medications Prior to Admission  Medication Sig Dispense Refill  . amLODipine (NORVASC) 5 MG tablet Take 5 mg by mouth daily.      Marland Kitchen azaTHIOprine (IMURAN) 50 MG tablet Take 50 mg by mouth 3 (three) times daily.       . carvedilol (COREG) 25 MG tablet Take 25 mg by mouth 2 (two) times daily.       . Multiple Vitamins-Minerals (CENTRUM SILVER PO) Take 1 tablet by mouth daily.       . predniSONE (DELTASONE) 5 MG tablet Take 5 mg by mouth daily.       . tacrolimus (PROGRAF) 1 MG capsule Take 4 mg by mouth daily.         Results for orders placed during the hospital encounter of 09/07/13 (from the past 48 hour(s))  CULTURE, BLOOD (ROUTINE X 2)     Status: None   Collection Time    09/07/13 11:41 AM      Result Value Range   Specimen Description       Value: BLOOD BOTTLES DRAWN AEROBIC AND ANAEROBIC LEFT ANTECUBITAL   Special Requests 8CC     Culture PENDING     Report Status PENDING    CBC WITH DIFFERENTIAL     Status: Abnormal   Collection Time    09/07/13  11:42 AM      Result Value Range   WBC 13.2 (*) 4.0 - 10.5 K/uL   RBC 3.40 (*) 4.22 - 5.81 MIL/uL   Hemoglobin 11.5 (*) 13.0 - 17.0 g/dL   HCT 16.1 (*) 09.6 - 04.5 %   MCV 99.7  78.0 - 100.0 fL   MCH 33.8  26.0 - 34.0 pg   MCHC 33.9  30.0 - 36.0 g/dL   RDW 40.9  81.1 - 91.4 %   Platelets 240  150 - 400 K/uL   Neutrophils Relative % 91 (*) 43 - 77 %   Neutro Abs 12.0 (*) 1.7 - 7.7 K/uL   Lymphocytes Relative 3 (*) 12 - 46 %   Lymphs Abs 0.4 (*) 0.7 - 4.0 K/uL   Monocytes Relative 6  3 - 12 %   Monocytes Absolute 0.8  0.1 - 1.0 K/uL   Eosinophils Relative 0  0 - 5 %   Eosinophils Absolute 0.0  0.0 - 0.7 K/uL   Basophils Relative 0  0 - 1 %   Basophils Absolute 0.0  0.0 - 0.1 K/uL  COMPREHENSIVE METABOLIC PANEL     Status: Abnormal   Collection Time    09/07/13 11:42 AM      Result Value Range   Sodium 130 (*) 135 - 145 mEq/L   Potassium 4.1  3.5 - 5.1 mEq/L   Chloride 92 (*) 96 - 112 mEq/L   CO2 27  19 - 32 mEq/L    Glucose, Bld 102 (*) 70 - 99 mg/dL   BUN 37 (*) 6 - 23 mg/dL   Creatinine, Ser 7.82 (*) 0.50 - 1.35 mg/dL   Calcium 9.2  8.4 - 95.6 mg/dL   Total Protein 6.5  6.0 - 8.3 g/dL   Albumin 3.3 (*) 3.5 - 5.2 g/dL   AST 14  0 - 37 U/L   ALT 11  0 - 53 U/L   Alkaline Phosphatase 54  39 - 117 U/L   Total Bilirubin 0.7  0.3 - 1.2 mg/dL   GFR calc non Af Amer 32 (*) >90 mL/min   GFR calc Af Amer 37 (*) >90 mL/min   Comment: (NOTE)     The eGFR has been calculated using the CKD EPI equation.     This calculation has not been validated in all clinical situations.     eGFR's persistently <90 mL/min signify possible Chronic Kidney     Disease.  CULTURE, BLOOD (ROUTINE X 2)     Status: None   Collection Time    09/07/13 11:43 AM      Result Value Range   Specimen Description       Value: BLOOD BOTTLES DRAWN AEROBIC AND ANAEROBIC RIGHT ANTECUBITAL   Special Requests 12CC     Culture PENDING     Report Status PENDING    CG4 I-STAT (LACTIC ACID)     Status: None   Collection Time    09/07/13 11:47 AM      Result Value Range   Lactic Acid, Venous 1.45  0.5 - 2.2 mmol/L  MRSA PCR SCREENING     Status: None   Collection Time    09/07/13  7:23 PM      Result Value Range   MRSA by PCR NEGATIVE  NEGATIVE   Comment:            The GeneXpert MRSA Assay (FDA     approved for NASAL specimens     only),  is one component of a     comprehensive MRSA colonization     surveillance program. It is not     intended to diagnose MRSA     infection nor to guide or     monitor treatment for     MRSA infections.   Ct Abdomen Pelvis Wo Contrast  09/07/2013   *RADIOLOGY REPORT*  Clinical Data: Low abdomen pain.  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  CT ABDOMEN AND PELVIS WITHOUT CONTRAST  Technique:  Multidetector CT imaging of the abdomen and pelvis was performed following the standard protocol without intravenous contrast.  Comparison: February 11, 2009.   Findings: There is abnormal thickening of the sigmoid colon with perforation.  There is free air surrounding the sigmoid colon with air extending superiorly. There is no small bowel obstruction.  There are several low density lesions within the liver, most are present on the prior CT of February 06, 2009.  Evaluation is limited without contrast.  The spleen, pancreas, gallbladder, adrenal glands are normal.  There is no nephrolithiasis or hydroureternephrosis  bilaterally.  There is atherosclerosis of the abdominal aorta without aneurysmal dilatation.  Images of the pelvis demonstrate partial fluid filled bladder without abnormality.  Left inguinal herniation of mesenteric fat is identified measuring 5.6 cm.  There is mild scarring of the posterior lung bases with mild dependent atelectasis of the right lung base. There is minimal right pleural effusion . Degenerative joint changes of the spine are noted.  IMPRESSION: Abnormal thickening of sigmoid colon with perforation. Differential diagnosis includes diverticulitis with perforation versus colon cancer with perforation.   Original Report Authenticated By: Sherian Rein, M.D.    Review of Systems  Constitutional: Negative for fever, chills, weight loss and malaise/fatigue.  Eyes: Negative for photophobia.  Respiratory: Negative for cough, hemoptysis, sputum production, shortness of breath and wheezing.   Cardiovascular: Negative for chest pain, palpitations, orthopnea, claudication, leg swelling and PND.  Gastrointestinal: Positive for abdominal pain and diarrhea. Negative for nausea, vomiting, blood in stool and melena.  Genitourinary: Negative for dysuria.  Neurological: Negative for dizziness, loss of consciousness and headaches.  Psychiatric/Behavioral: Negative for depression.    Blood pressure 128/59, pulse 83, temperature 98.2 F (36.8 C), temperature source Oral, resp. rate 18, height 6' (1.829 m), weight 175 lb 7.8 oz (79.6 kg), SpO2  97.00%. Physical Exam  Constitutional: He is oriented to person, place, and time. He appears well-developed and well-nourished. No distress.  HENT:  Head: Normocephalic and atraumatic.  Eyes: Conjunctivae and EOM are normal. Pupils are equal, round, and reactive to light.  Neck: Normal range of motion. Neck supple. No JVD present. No tracheal deviation present. No thyromegaly present.  Cardiovascular: Normal rate, regular rhythm and normal heart sounds.   Respiratory: Effort normal and breath sounds normal. No respiratory distress. He has no wheezes. He exhibits no tenderness.  GI: Soft. He exhibits distension. There is tenderness. There is no rebound and no guarding.  Musculoskeletal: Normal range of motion. He exhibits no edema.  Neurological: He is alert and oriented to person, place, and time. No cranial nerve deficit.  Skin: Skin is warm. He is not diaphoretic.  Psychiatric: He has a normal mood and affect.     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 at [facility] currently undergoing  chemotherapy and radiation therapy, CKD stage 3, presents with abdominal pain, and found to have sigmoid diverticulitis with focal perforation. 1) Sigmoid diverticulitis with focal perforation: Will continue Invanz for now.  BC x 2 ordered. His abdominal exam is softly distended with tenderness throughout but no rebound tenderness.  He will be monitored. 2) S/P Heart transplant: He can take his immunosuppressives orally for now. Cardiology consult may be obtained in the AM for further assistance. 3) AKI on CKD 3: May be secondary to recent chemotherapy. Will give IVF with NS and monitor UOP, repeat Cr daily to monitor for improvement/worsening. 4) FEN: NPO except meds. IVF. 5) Proph: heparin. 6) Code: FULL  Jonah Blue, DO, FACP 09/07/2013, 11:09 PM

## 2013-09-07 NOTE — ED Notes (Signed)
Patient left ED at this time with [facility]. No distress upon departure. 

## 2013-09-07 NOTE — ED Notes (Signed)
Pt c/o fever, abd cramping that started yesterday, denies any n/v, has hx of heart transplant and throat cancer, currently receiving chemo erbitux and radiation at [facility], being followed by Dr. Sherrlyn Hock.

## 2013-09-07 NOTE — ED Notes (Signed)
Normal saline bolus on pressure bag per Dr Fayrene Fearing.

## 2013-09-07 NOTE — ED Notes (Signed)
Report given to [facility]. 20 minute ETA. Patient made aware.

## 2013-09-07 NOTE — ED Notes (Signed)
[facility] at bedside 

## 2013-09-07 NOTE — Progress Notes (Signed)
Patient Arrived on unit from [facility].  Dr Cena Benton paged.

## 2013-09-07 NOTE — ED Provider Notes (Signed)
CSN: [id number]     Arrival date & time 09/07/13  1035 History   This chart was scribed for Roney Marion, MD, by Yevette Edwards, ED Scribe. This patient was seen in room APA11/APA11 and the patient's care was started at 11:10 AM . First MD Initiated Contact with Patient 09/07/13 1100     Chief Complaint  Patient presents with  . Fever  . Abdominal Pain    The history is provided by the patient and the spouse. No language interpreter was used.   HPI Comments: Peter Clarke is a 67 y.o. male, with a h/o heart transplant in 2003 and throat cancer which was diagnosed two months ago, who presents to the Emergency Department complaining of bilateral lower quadrant abdominal cramping, rated at 10/10, which began yesterday along with an associated fever. His fever began yesterday evening, and his temperature was 101 F when measured this morning. The pt states the abdominal cramping are currently improved slightly. He denies any nausea, diarrhea, cough, SOB, chest pain, dysuria, or hematuria.  He has a h/o abdominal surgery including appendectomy. He denies any abnormalities on his last colonoscopy.The pt has had three rounds of chemo to treat the throat cancer; he denies any nausea associated with the chemo. His most recent chemo treatment was five days ago at [facility]. He has a h/o of an LVAD which he had six months.   Past Medical History  Diagnosis Date  . Hypertension   . H/O heart transplant 2003  . Wears glasses   . Wears dentures     upper  . Renal insufficiency   . Cancer     tonsil  . Throat cancer    Past Surgical History  Procedure Laterality Date  . Heart transplant N/A 2003    [facility]-  . Appendectomy    . Colonoscopy    . Hernia repair  2008    rt ing  . Cardiac catheterization  2011  . Tonsillectomy Left 07/07/2013    Procedure: TONSILLECTOMY;  Surgeon: Darletta Moll, MD;  Location: [facility];  Service: ENT;  Laterality: Left;  . Direct  laryngoscopy N/A 07/07/2013    Procedure: DIRECT LARYNGOSCOPY WITH BIOPSY;  Surgeon: Darletta Moll, MD;  Location: [facility];  Service: ENT;  Laterality: N/A;   History reviewed. No pertinent family history. History  Substance Use Topics  . 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 Smokeless tobacco: Never Used  . Alcohol Use: No    Review of Systems  Constitutional: Positive for fever.  HENT: Positive for sore throat (Attributed to the chemo for throat cancer. ).   Respiratory: Negative for cough and shortness of breath.   Cardiovascular: Negative for chest pain.  Gastrointestinal: Positive for abdominal pain. Negative for nausea, vomiting and diarrhea.  Genitourinary: Negative for dysuria and flank pain.  All other systems reviewed and are negative.    Allergies  Pepto-bismol  Home Medications   Current Outpatient Rx  Name  Route  Sig  Dispense  Refill  . amLODipine (NORVASC) 5 MG tablet   Oral   Take 5 mg by mouth daily.         Marland Kitchen azaTHIOprine (IMURAN) 50 MG tablet   Oral   Take 50 mg by mouth 3 (three) times daily.          . carvedilol (COREG) 25 MG tablet   Oral  Take 25 mg by mouth 2 (two) times daily.          . Multiple Vitamins-Minerals (CENTRUM SILVER PO)   Oral   Take 1 tablet by mouth daily.          . predniSONE (DELTASONE) 5 MG tablet   Oral   Take 5 mg by mouth daily.          . tacrolimus (PROGRAF) 1 MG capsule   Oral   Take 4 mg by mouth daily.           Triage Vitals: BP 96/63  Pulse 108  Temp(Src) 100.2 F (37.9 C)  Resp 20  SpO2 95%  Physical Exam  Nursing note and vitals reviewed. Constitutional: He is oriented to person, place, and time. He appears well-developed and well-nourished. No distress.  HENT:  Head: Normocephalic and atraumatic.  Mouth/Throat: Oropharyngeal exudate present.  Dry mucous membranes. Erythematous pharynx. White exudate.   Eyes:  Conjunctivae and EOM are normal. No scleral icterus.  Neck: Neck supple. No tracheal deviation present.  Cardiovascular: Normal rate.   Pulmonary/Chest: Effort normal and breath sounds normal. No respiratory distress. He has no wheezes. He has no rales.  Abdominal: There is tenderness.  Bilateral lower quadrant tenderness without peritoneal tenderness. Suprapubic tenderness. Normal active bowel sounds.   Musculoskeletal: Normal range of motion. He exhibits no edema.  Neurological: He is alert and oriented to person, place, and time.  Skin: Skin is warm and dry.  Slight paleness to finger nails.   Psychiatric: He has a normal mood and affect. His behavior is normal.    ED Course  Procedures (including critical care time)  DIAGNOSTIC STUDIES: Oxygen Saturation is 95% on room air, adequate by my interpretation.    COORDINATION OF CARE:  11:20 AM- Discussed treatment plan with patient, and the patient agreed to the plan.   Labs Review Labs Reviewed  CBC WITH DIFFERENTIAL - Abnormal; Notable for the following:    WBC 13.2 (*)    RBC 3.40 (*)    Hemoglobin 11.5 (*)    HCT 33.9 (*)    Neutrophils Relative % 91 (*)    Neutro Abs 12.0 (*)    Lymphocytes Relative 3 (*)    Lymphs Abs 0.4 (*)    All other components within normal limits  COMPREHENSIVE METABOLIC PANEL - Abnormal; Notable for the following:    Sodium 130 (*)    Chloride 92 (*)    Glucose, Bld 102 (*)    BUN 37 (*)    Creatinine, Ser 2.06 (*)    Albumin 3.3 (*)    GFR calc non Af Amer 32 (*)    GFR calc Af Amer 37 (*)    All other components within normal limits  CULTURE, BLOOD (ROUTINE X 2)  CULTURE, BLOOD (ROUTINE X 2)  URINE CULTURE  URINALYSIS, ROUTINE W REFLEX MICROSCOPIC  CG4 I-STAT (LACTIC ACID)   Imaging Review Ct Abdomen Pelvis Wo Contrast  09/07/2013   *RADIOLOGY REPORT*  Clinical Data: Low abdomen pain.  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  CT ABDOMEN  AND PELVIS WITHOUT CONTRAST  Technique:  Multidetector CT imaging of the abdomen and pelvis was performed following the standard protocol without intravenous contrast.  Comparison: February 11, 2009.  Findings: There is abnormal thickening of the sigmoid colon with perforation.  There is free air surrounding the sigmoid colon with air extending superiorly. There is no small bowel  obstruction.  There are several low density lesions within the liver, most are present on the prior CT of February 06, 2009.  Evaluation is limited without contrast.  The spleen, pancreas, gallbladder, adrenal glands are normal.  There is no nephrolithiasis or hydroureternephrosis  bilaterally.  There is atherosclerosis of the abdominal aorta without aneurysmal dilatation.  Images of the pelvis demonstrate partial fluid filled bladder without abnormality.  Left inguinal herniation of mesenteric fat is identified measuring 5.6 cm.  There is mild scarring of the posterior lung bases with mild dependent atelectasis of the right lung base. There is minimal right pleural effusion . Degenerative joint changes of the spine are noted.  IMPRESSION: Abnormal thickening of sigmoid colon with perforation. Differential diagnosis includes diverticulitis with perforation versus colon cancer with perforation.   Original Report Authenticated By: Sherian Rein, M.D.    MDM   1. Perforated diverticulitis   2. Immunosuppression    Unfortunately Peter Clarke CT scan shows a diverticular perforation. There is a thickened segment of bowel. Per the radiologist report this would include a differential diagnosis of perforated diverticulitis or colonic cancer. No abscess formation. No fluid collection. No diffuse pneumoperitoneum. Although extensive air,  is localized at the rectosigmoid.  Currently he appears to feel well. As I walked back into the room to discuss the findings he has his own abdomen and states "Doc, I'm ready  to go home".  I discussed the  case with our surgeon here at any 10 Dr. Lovell Sheehan. Dr. Lovell Sheehan as always return my call immediately. We discussed the case in its entirety. He felt most comfortable with patient going to a higher level of care/tertiary care center where there would be cardiology and oncology available for consultation. I agree with this. I discussed the case with Dr. [name] the Gen. [facility], discussed the case with Dr. Cena Benton the hospital [facility] service at [facility]. Range be made the patient be transferred to [facility]. He had a dip in his blood pressure in the high 90s and into the high 80s. He is responding to fluid boluses. He continues to look well. He is perfusing well. His lactate was 1.45. He is not tachycardic. He is not tachypneic or hypoxemic. His mental status is normal. His abdomen remains benign to exam.  Continues to make urine.  Plan is emergent transfer to [facility] for additional care.   I personally performed the services described in this documentation, which was scribed in my presence. The recorded information has been reviewed and is accurate.    Roney Marion, MD 09/07/13 [phone]

## 2013-09-07 NOTE — ED Notes (Signed)
Patient requesting to take daily anti-rejection medication from home. Dr Fayrene Fearing aware and ok with this. Patient/wife informed. Patient took home medication.

## 2013-09-08 DIAGNOSIS — D72829 Elevated white blood cell count, unspecified: Secondary | ICD-10-CM | POA: Diagnosis present

## 2013-09-08 DIAGNOSIS — Z01810 Encounter for preprocedural cardiovascular examination: Secondary | ICD-10-CM

## 2013-09-08 DIAGNOSIS — R509 Fever, unspecified: Secondary | ICD-10-CM | POA: Diagnosis present

## 2013-09-08 DIAGNOSIS — N183 Chronic kidney disease, stage 3 unspecified: Secondary | ICD-10-CM | POA: Diagnosis present

## 2013-09-08 DIAGNOSIS — I5022 Chronic systolic (congestive) heart failure: Secondary | ICD-10-CM

## 2013-09-08 DIAGNOSIS — I369 Nonrheumatic tricuspid valve disorder, unspecified: Secondary | ICD-10-CM

## 2013-09-08 DIAGNOSIS — I5032 Chronic diastolic (congestive) heart failure: Secondary | ICD-10-CM | POA: Diagnosis present

## 2013-09-08 LAB — COMPREHENSIVE METABOLIC PANEL
ALT: 9 U/L (ref 0–53)
AST: 13 U/L (ref 0–37)
Albumin: 2.6 g/dL — ABNORMAL LOW (ref 3.5–5.2)
Alkaline Phosphatase: 43 U/L (ref 39–117)
Chloride: 102 mEq/L (ref 96–112)
Potassium: 5.2 mEq/L — ABNORMAL HIGH (ref 3.5–5.1)
Sodium: 135 mEq/L (ref 135–145)
Total Bilirubin: 0.5 mg/dL (ref 0.3–1.2)

## 2013-09-08 LAB — URINALYSIS, ROUTINE W REFLEX MICROSCOPIC
Nitrite: NEGATIVE
Protein, ur: NEGATIVE mg/dL
Specific Gravity, Urine: 1.015 (ref 1.005–1.030)
Urobilinogen, UA: 1 mg/dL (ref 0.0–1.0)

## 2013-09-08 LAB — CBC
Hemoglobin: 9.8 g/dL — ABNORMAL LOW (ref 13.0–17.0)
MCH: 33.8 pg (ref 26.0–34.0)
MCV: 98.6 fL (ref 78.0–100.0)
Platelets: 207 10*3/uL (ref 150–400)
RDW: 13.1 % (ref 11.5–15.5)
WBC: 13 10*3/uL — ABNORMAL HIGH (ref 4.0–10.5)

## 2013-09-08 LAB — PROTIME-INR
INR: 1.64 — ABNORMAL HIGH (ref 0.00–1.49)
Prothrombin Time: 19 seconds — ABNORMAL HIGH (ref 11.6–15.2)

## 2013-09-08 IMAGING — CT NM PET TUM IMG INITIAL (PI) SKULL BASE T - THIGH
1 of 5 series · 1 of 25 positions shown · non-contrast
Comparison: none

REASON FOR EXAM: inital stage head and neck CA
COMMENTS:

PROCEDURE:     PET - PET/CT INIT STAG HEAD/NECK CA  - July 28, 2013 [DATE]
RESULT:     Indication: Head and neck cancer
Radiopharmaceutical: 13.14 mCi F18-FDG, intravenously.
TECHNIQUE: Imaging was performed from the skull base to the mid-thigh using
routine PET/CT acquisition protocol.
Injection site: Left antecubital
Time of FDG injection: 7233 hours
Serum glucose: 97 mg/dL
Time of imaging: 5584 hours with delayed images at 5585 hours
Comparison studies: NONE

[Series 102: pet wb · axial · 5.0mm · 4.07mm/px · 1 of 329 slices shown]
[im 165/329]
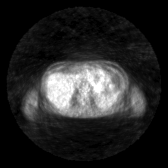

[1 of 25 positions shown; findings below may reference images not displayed]

FINDINGS: HEAD AND NECK:

There is a large hypermetabolic left parapharyngeal mass with an SUV max of
17.8 and an SUV average of 12.9. There is a hypermetabolic left upper
cervical lymph node measuring 13 mm in short axis.

CHEST:

There is no abnormal hypermetabolic activity in the chest.

There are bilateral mild emphysematous changes. There is no focal
parenchymal opacity, pleural effusion, or pneumothorax.

The heart size is normal. There is no pericardial effusion.

There no pathologically enlarged mediastinal, hilar, or axillary lymph
nodes.

The osseous structures demonstrate no focal abnormality.

ABDOMEN/PELVIS:

The liver demonstrates no focal abnormality. The gallbladder is
unremarkable. The spleen demonstrates no focal abnormality. The kidneys,
adrenal glands, pancreas are normal. The bladder is unremarkable.

The unopacified bowel is unremarkable. There is no pneumoperitoneum,
pneumatosis, or portal venous gas. There is diverticulosis without evidence
of diverticulitis. There is no abdominal or pelvic free fluid. There is no
lymphadenopathy.

The abdominal aorta is normal in caliber.

The osseous structures are unremarkable.
IMPRESSION: 1. Hypermetabolic left parapharyngeal soft tissue mass most concerning for
squamous cell carcinoma. There is a hypermetabolic left upper cervical lymph
node concerning for nodal metastasis.

[REDACTED]

## 2013-09-08 MED ORDER — SODIUM CHLORIDE 0.9 % IV SOLN
INTRAVENOUS | Status: DC
  Administered 2013-09-08 – 2013-09-09 (×4): via INTRAVENOUS

## 2013-09-08 MED ORDER — CARVEDILOL 6.25 MG PO TABS
6.2500 mg | ORAL_TABLET | Freq: Two times a day (BID) | ORAL | Status: DC
  Administered 2013-09-08 – 2013-09-09 (×2): 6.25 mg via ORAL
  Filled 2013-09-08 (×3): qty 1

## 2013-09-08 MED ORDER — ACETAMINOPHEN 500 MG PO TABS
1000.0000 mg | ORAL_TABLET | ORAL | Status: DC | PRN
  Administered 2013-09-08 (×2): 1000 mg via ORAL
  Filled 2013-09-08 (×2): qty 2

## 2013-09-08 MED ORDER — MORPHINE SULFATE 2 MG/ML IJ SOLN
2.0000 mg | INTRAMUSCULAR | Status: DC | PRN
  Administered 2013-09-08 – 2013-09-09 (×7): 2 mg via INTRAVENOUS
  Filled 2013-09-08 (×8): qty 1

## 2013-09-08 MED ORDER — AZATHIOPRINE 50 MG PO TABS
100.0000 mg | ORAL_TABLET | Freq: Every day | ORAL | Status: DC
  Administered 2013-09-09 – 2013-09-23 (×15): 100 mg via ORAL
  Filled 2013-09-08 (×16): qty 2

## 2013-09-08 MED ORDER — TACROLIMUS 1 MG PO CAPS
2.0000 mg | ORAL_CAPSULE | Freq: Two times a day (BID) | ORAL | Status: DC
  Administered 2013-09-08 – 2013-09-09 (×2): 2 mg via ORAL
  Filled 2013-09-08 (×3): qty 2

## 2013-09-08 MED ORDER — PERFLUTREN LIPID MICROSPHERE
1.0000 mL | INTRAVENOUS | Status: AC | PRN
  Administered 2013-09-08: 2 mL via INTRAVENOUS
  Filled 2013-09-08: qty 10

## 2013-09-08 NOTE — Progress Notes (Signed)
Utilization review completed.  

## 2013-09-08 NOTE — Progress Notes (Signed)
Patient ID: [name], male   DOB: 04/17/1946, 67 y.o.   MRN: [id number] Pt seen and re-examined Still with some abd pain; no n/v. +flatus  BP 98/57  Pulse 88  Temp(Src) 98.4 F (36.9 C) (Oral)  Resp 21  Ht 6' (1.829 m)  Wt 175 lb 7.8 oz (79.6 kg)  BMI 23.79 kg/m2  SpO2 96% Now Afebrile.  nad Soft, nd. Mild TTP LLQ/suprapubic  Exam stable. No fever.  Will cont non-operative mgmt for now Appreciate cards input If worsens -->ex lap, hartman's procedure  Cont bowel rest, IV abx Repeat labs and exam in am Discussed with family and pt  [name]. [name], MD, FACS General, Bariatric, & Minimally Invasive Surgery [facility], [place]

## 2013-09-08 NOTE — Progress Notes (Signed)
Subjective: Still with some abd pain - about same as last night. Some hiccups. No flatus. Temp this am. Wbc unchanged  Objective: Vital signs in last 24 hours: Temp:  [97.5 F (36.4 C)-101.5 F (38.6 C)] 101.5 F (38.6 C) (09/22 0734) Pulse Rate:  [75-108] 96 (09/22 0737) Resp:  [12-20] 17 (09/22 0737) BP: (84-128)/(52-73) 127/67 mmHg (09/22 0737) SpO2:  [92 %-100 %] 100 % (09/22 0737) Weight:  [175 lb 7.8 oz (79.6 kg)] 175 lb 7.8 oz (79.6 kg) (09/21 1845) Last BM Date: 09/07/13  Intake/Output from previous day: 09/21 0701 - 09/22 0700 In: 700 [I.V.:700] Out: 300 [Urine:300] Intake/Output this shift:    Alert, resting comfortably, nontoxic cta b/l Reg Soft, ND, mild TTP lower midline and periumbilical. No RT/guarding. No peritonitis  Lab Results:   Recent Labs  09/07/13 1142 09/08/13 0340  WBC 13.2* 13.0*  HGB 11.5* 9.8*  HCT 33.9* 28.6*  PLT 240 207   BMET  Recent Labs  09/07/13 1142 09/08/13 0340  NA 130* 135  K 4.1 5.2*  CL 92* 102  CO2 27 24  GLUCOSE 102* 97  BUN 37* 36*  CREATININE 2.06* 1.84*  CALCIUM 9.2 8.4   PT/INR  Recent Labs  09/08/13 0340  LABPROT 19.0*  INR 1.64*   ABG No results found for this basename: PHART, PCO2, PO2, HCO3,  in the last 72 hours  Studies/Results: Ct Abdomen Pelvis Wo Contrast  09/07/2013   *RADIOLOGY REPORT*  Clinical Data: Low abdomen pain.  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  CT ABDOMEN AND PELVIS WITHOUT CONTRAST  Technique:  Multidetector CT imaging of the abdomen and pelvis was performed following the standard protocol without intravenous contrast.  Comparison: February 11, 2009.  Findings: There is abnormal thickening of the sigmoid colon with perforation.  There is free air surrounding the sigmoid colon with air extending superiorly. There is no small bowel obstruction.  There are several low density lesions within the liver, most are present on the prior  CT of February 06, 2009.  Evaluation is limited without contrast.  The spleen, pancreas, gallbladder, adrenal glands are normal.  There is no nephrolithiasis or hydroureternephrosis  bilaterally.  There is atherosclerosis of the abdominal aorta without aneurysmal dilatation.  Images of the pelvis demonstrate partial fluid filled bladder without abnormality.  Left inguinal herniation of mesenteric fat is identified measuring 5.6 cm.  There is mild scarring of the posterior lung bases with mild dependent atelectasis of the right lung base. There is minimal right pleural effusion . Degenerative joint changes of the spine are noted.  IMPRESSION: Abnormal thickening of sigmoid colon with perforation. Differential diagnosis includes diverticulitis with perforation versus colon cancer with perforation.   Original Report Authenticated By: Sherian Rein, M.D.    Anti-infectives: Anti-infectives   Start     Dose/Rate Route Frequency Ordered Stop   09/07/13 2000  ertapenem (INVANZ) 1 g in sodium chloride 0.9 % 50 mL IVPB     1 g 100 mL/hr over 30 Minutes Intravenous Every 24 hours 09/07/13 1914     09/07/13 1430  piperacillin-tazobactam (ZOSYN) IVPB 3.375 g     3.375 g 12.5 mL/hr over 240 Minutes Intravenous  Once 09/07/13 1425 09/07/13 1516   09/07/13 1430  metroNIDAZOLE (FLAGYL) IVPB 500 mg     500 mg 100 mL/hr over 60 Minutes Intravenous  Once 09/07/13 1425 09/07/13 1617      Assessment/Plan: Sigmoid diverticulitis with microperforation H/o  heart tx on immunosuppression Current Tonsillar cancer - last chemo/xrt last Tuesday per pt  abd exam is stable however temp of 101.5 this am; wbc stable. No tachycardia. For now cont current plan - bowel rest, IVF, IV abx. If pt has ongoing temp spikes, develops tachycardia, worsening abd pain - will need Hartman's procedure - explained this to pt  rec Card consult this AM for cardiac eval for potential surgery - colectomy  rec contacting pt's transplant  coordinator at [facility] to inform them of pt's status. Lattie Corns, NP, tx coordinator, [phone] per [facility]. [name] cardiologist Dr Adonis Housekeeper - [phone] -  Any any guidance on tx medications.   Will re-evaluate around noon  Karyss Frese M. [name], MD, FACS General, Bariatric, & Minimally Invasive Surgery [facility], [place]   LOS: 1 day    Atilano Ina 09/08/2013

## 2013-09-08 NOTE — Consult Note (Addendum)
. Advanced Heart Failure Team Consult Note  Referring Physician: Dr. [name] ([facility]) Primary Cardiologist:  Dr. 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 We are asked to provide pre-operative recommendations.   Workup there revealed mild leukocytosis and CT scan of the abdomen and pelvis revealed sigmoid diverticulitis with focal perforation with some air surrounding the sigmoid colon. He remains on his immunosuppressive regimen. Denies recent CP or HF symptoms. Has not had any recent bouts of rejection. No recent cath.  Did receive 1 dose stress dose steroids here. Continues to have ab pain and had fever to 101.5 this am. No evidence of peritonitis.   Review of Systems: [y] = yes, [ ]  = no   General: Weight gain [ ] ; Weight loss [ ] ; Anorexia Cove.Etienne ]; Fatigue [ ] ; Fever Cove.Etienne ]; Chills [ ] ; Weakness [ ]   Cardiac: Chest pain/pressure [ ] ; Resting SOB [ ] ; Exertional SOB [ ] ; Orthopnea [ ] ; Pedal Edema [ ] ; Palpitations [ ] ; Syncope [ ] ; Presyncope [ ] ; Paroxysmal nocturnal dyspnea[ ]   Pulmonary: Cough [ ] ; Wheezing[ ] ; Hemoptysis[ ] ; Sputum [ ] ; Snoring [ ]   GI: Vomiting[ ] ; Dysphagia[ ] ; Melena[ ] ; Hematochezia [ ] ; Heartburn[ ] ; Abdominal pain Cove.Etienne ]; Constipation [ ] ; Diarrhea [ ] ; BRBPR [ ]   GU: Hematuria[ ] ; Dysuria [ ] ; Nocturia[ ]   Vascular: Pain in legs with walking [ ] ; Pain in feet with lying flat [ ] ; Non-healing sores [ ] ; Stroke [ ] ; TIA [ ] ; Slurred speech [ ] ;  Neuro: Headaches[ ] ; Vertigo[ ] ; Seizures[ ] ; Paresthesias[ ] ;Blurred vision [ ] ; Diplopia [ ] ; Vision changes [ ]   Ortho/Skin: Arthritis [ ] ; Joint pain [ ] ; Muscle pain [ ] ; Joint swelling [ ] ; Back Pain [ ] ; Rash [ ]    Psych: Depression[ ] ; Anxiety[ ]   Heme: Bleeding problems [ ] ; Clotting disorders [ ] ; Anemia [ ]   Endocrine: Diabetes [ ] ; Thyroid dysfunction[ ]   Home Medications Prior to Admission medications   Medication Sig Start Date End Date Taking? Authorizing Provider  amLODipine (NORVASC) 5 MG tablet Take 5 mg by mouth daily.   Yes Historical Provider, MD  azaTHIOprine (IMURAN) 50 MG tablet Take 150 mg by mouth daily.  05/13/13  Yes Historical Provider, MD  carvedilol (COREG) 25 MG tablet Take 25 mg by mouth 2 (two) times daily.  06/04/13  Yes Historical Provider, MD  Multiple Vitamins-Minerals (CENTRUM SILVER PO) Take 1 tablet by mouth daily.    Yes Historical Provider, MD  predniSONE (DELTASONE) 5 MG tablet Take 5 mg by mouth daily.  05/13/13  Yes Historical Provider, MD  tacrolimus (PROGRAF) 1 MG capsule Take 4 mg by mouth daily.  03/17/13  Yes Historical Provider, MD    Past Medical History: Past Medical History  Diagnosis Date  . Hypertension   . H/O heart transplant 2003  . Wears glasses   . Wears dentures     upper  . Renal insufficiency   . Cancer     tonsil  . Throat cancer     Past Surgical History: Past Surgical History  Procedure Laterality Date  . Heart transplant N/A 2003    [facility]-  . Appendectomy    .  Colonoscopy    . Hernia repair  2008    rt ing  . Cardiac catheterization  2011  . Tonsillectomy Left 07/07/2013    Procedure: TONSILLECTOMY;  Surgeon: Darletta Moll, MD;  Location: [facility];  Service: ENT;  Laterality: Left;  . Direct laryngoscopy N/A 07/07/2013    Procedure: DIRECT LARYNGOSCOPY WITH BIOPSY;  Surgeon: Darletta Moll, MD;  Location: [facility];  Service: ENT;  Laterality: N/A;    Family History: History reviewed. No pertinent family history.  Social History: History   Social History  . Marital Status: Married    Spouse Name: N/A    Number of Children: N/A  . Years of Education: N/A   Social History Main Topics  .  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 Smokeless tobacco: Never Used  . Alcohol Use: No  . Drug Use: No  . Sexual Activity: None     Comment: occ pipe-no cigs   Other Topics Concern  . None   Social History Narrative  . None    Allergies:  Allergies  Allergen Reactions  . Pepto-Bismol [Bismuth Subsalicylate]     rash    Objective:    Vital Signs:   Temp:  [97.5 F (36.4 C)-101.5 F (38.6 C)] 98.4 F (36.9 C) (09/22 1115) Pulse Rate:  [75-102] 88 (09/22 1115) Resp:  [12-24] 21 (09/22 1115) BP: (84-149)/(52-120) 98/57 mmHg (09/22 1115) SpO2:  [92 %-100 %] 96 % (09/22 1115) Weight:  [79.6 kg (175 lb 7.8 oz)] 79.6 kg (175 lb 7.8 oz) (09/21 1845) Last BM Date: 09/07/13  Weight change: Filed Weights   09/07/13 1845  Weight: 79.6 kg (175 lb 7.8 oz)    Intake/Output:   Intake/Output Summary (Last 24 hours) at 09/08/13 1227 Last data filed at 09/08/13 0700  Gross per 24 hour  Intake    700 ml  Output    300 ml  Net    400 ml     Physical Exam: General:  No acute distress.  HEENT: normal Neck: supple. JVP . Carotids 2+ bilat; no bruits. No lymphadenopathy or thryomegaly appreciated. Cor: PMI nondisplaced. Regular rate & rhythm. No rubs, gallops or murmurs. Lungs: clear Abdomen: soft, + peri-umbilical tenderness, nondistended. No rebound/guarding No hepatosplenomegaly. No bruits or masses. Good bowel sounds. Extremities: no cyanosis, clubbing, rash, edema Neuro: alert & orientedx3, cranial nerves grossly intact. moves all 4 extremities w/o difficulty. Affect pleasant  Telemetry: SR 70s-80s  Labs: Basic Metabolic Panel:  Recent Labs Lab 09/07/13 1142 09/08/13 0340  NA 130* 135  K 4.1 5.2*  CL 92* 102  CO2 27 24  GLUCOSE 102* 97  BUN 37* 36*  CREATININE 2.06* 1.84*  CALCIUM 9.2 8.4    Liver Function Tests:  Recent Labs Lab 09/07/13 1142 09/08/13 0340  AST 14 13  ALT 11 9  ALKPHOS 54 43   BILITOT 0.7 0.5  PROT 6.5 5.3*  ALBUMIN 3.3* 2.6*   No results found for this basename: LIPASE, AMYLASE,  in the last 168 hours No results found for this basename: AMMONIA,  in the last 168 hours  CBC:  Recent Labs Lab 09/07/13 1142 09/08/13 0340  WBC 13.2* 13.0*  NEUTROABS 12.0*  --   HGB 11.5* 9.8*  HCT 33.9* 28.6*  MCV 99.7 98.6  PLT 240 207    Cardiac Enzymes: No results found for this basename: CKTOTAL, CKMB, CKMBINDEX, TROPONINI,  in the last 168 hours  BNP: BNP (last 3 results) No results found for this basename: PROBNP,  in the last 8760 hours  CBG: No results found for this basename: GLUCAP,  in the last 168 hours  Coagulation Studies:  Recent Labs  09/08/13 0340  LABPROT 19.0*  INR 1.64*    Other results: EKG: pending  Imaging: Ct Abdomen Pelvis Wo Contrast  09/07/2013   *RADIOLOGY REPORT*  Clinical Data: Low abdomen pain.  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  CT ABDOMEN AND PELVIS WITHOUT CONTRAST  Technique:  Multidetector CT imaging of the abdomen and pelvis was performed following the standard protocol without intravenous contrast.  Comparison: February 11, 2009.  Findings: There is abnormal thickening of the sigmoid colon with perforation.  There is free air surrounding the sigmoid colon with air extending superiorly. There is no small bowel obstruction.  There are several low density lesions within the liver, most are present on the prior CT of February 06, 2009.  Evaluation is limited without contrast.  The spleen, pancreas, gallbladder, adrenal glands are normal.  There is no nephrolithiasis or hydroureternephrosis  bilaterally.  There is atherosclerosis of the abdominal aorta without aneurysmal dilatation.  Images of the pelvis demonstrate partial fluid filled bladder without abnormality.  Left inguinal herniation of mesenteric fat is identified measuring 5.6 cm.  There is mild scarring of the posterior  lung bases with mild dependent atelectasis of the right lung base. There is minimal right pleural effusion . Degenerative joint changes of the spine are noted.  IMPRESSION: Abnormal thickening of sigmoid colon with perforation. Differential diagnosis includes diverticulitis with perforation versus colon cancer with perforation.   Original Report Authenticated By: Sherian Rein, M.D.      Medications:     Current Medications: . amLODipine  5 mg Oral Daily  . azaTHIOprine  150 mg Oral Daily  . carvedilol  25 mg Oral BID  . ertapenem  1 g Intravenous Q24H  . heparin  5,000 Units Subcutaneous Q8H  . predniSONE  5 mg Oral Daily  . sodium chloride  3 mL Intravenous Q12H  . tacrolimus  2 mg Oral BID     Infusions: . sodium chloride 100 mL/hr at 09/08/13 1008      Assessment:   1. Acute diverticulitis with perforation 2. Chronic systolic HF s/p OHTx in 2003 ([name]) 3. Tonsillar cancer - currently undergoing Chemo/XRT 4. Acute on chronic renal failure  Plan/Discussion:    From a cardiac perspective, I feel that Peter Clarke is certainly stable enough to tolerate surgery without too much problem. The main issue will be managing his immunosuppressive regimen in the post-operative period. We discussed the possibility of him going to [facility] for his surgery to be followed more closely by the transplant team but he is fine with staying here.   I discussed the case with his transplant cardiologist, Dr. Ardyth Harps and he told me that Peter Clarke Imuran dose was decreased to 100 mg daily in 8/14 when he was diagnosed with his throat cancer. We will make this change. He also suggested switching Prograf to 1mg  bid sublingual post-op for better absorption and keeping trough around 10. Will continue prednisone and can resume stress dose steroids as needed.  With low BP will decrease carvedilol and hold amlodipine. Check echo. Check trough Prograf level in am.  Truman Hayward 12:40  PM Length of Stay: 1 Advanced Heart Failure Team Pager [phone] (  M-F; 7a - 4p)  Please contact [facility] for night-coverage after hours (4p -7a ) and weekends on [email or web address]

## 2013-09-08 NOTE — Progress Notes (Signed)
[facility] Progress Note [place] TEAM 1 - Stepdown ICU Team   Peter Clarke ZOX:[id number] DOB: 1946-05-26 DOA: 09/07/2013 PCP: [name], MD  Brief narrative: 67 year old male with remote history of cardiac transplant at [name] 11 years ago on chronic immunosuppressive therapy. 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 He also has underlying chronic kidney disease stage III. He presented to [facility] with abdominal pain associated with diarrhea but no melena or hematochezia and no nausea/vomiting. He initially presented to [facility] where CT of the abdomen and pelvis revealed abnormal thickening of the sigmoid colon with perforation. Given his underlying medical history she was subsequently transferred to [facility] for further treatment. After arrival here he has been evaluated by Dr. [name] with surgery who recommended conservative treatment with bowel rest and IV antibiotics. In review of his laboratory values he had a leukocytosis with white count of 13,000 and creatinine had increased to 2.96 from 1.57.  Assessment/Plan:  Diverticulitis with perforation -per [facility] -continue bowel rest and gentle IVF hydration (NPO) -today with persistent fever concern is for possible need for surgical tx  Fever/Leukocytosis -have not resolved and surgery following as marker of severity of infection/abscess  -contnue broad spectrum anbx's for GN's/enteric pathogens  History of heart transplant/chronic immunosuppression/history of Chronic Systolic heart failure -requested Cardiology pre op eval -Appreciate Cards assistance -ECHO pending to determine current LV function -BP soft so Cards has decreased Coreg and held Norvasc   Head and neck cancer -tx'd at [facility] -Imuran dose decreased to 100 mg Daily in August when dx'd with throat cancer. -Dr. [name] also d/w pt's transplant cardiologist  at [facility] who recommended switching Prograf to 1 mg BID SL post op for better absorption -continue stress dose steroids  HTN (hypertension) -see above  CKD (chronic kidney disease) stage 3, GFR 30-59 ml/min -follow after gentle IVF hydration   DVT prophylaxis: Subcutaneous heparin Code Status: Full Family Communication: Patient and family at bedside Disposition Plan/Expected LOS: Stepdown  Consultants: Cardiology General surgery  Procedures: 2-D echocardiogram - pending  Antibiotics: Ertapenem 9/21 >>>  Flagyl 9/21  Zosyn 9/21   HPI/Subjective: Patient alert and primarily complains of lower abdominal pain with movement, laughing or coughing. No shortness of breath, chest pain, nausea, vomiting or diarrhea. Actually endorses hunger and would like to eat.  Objective: Blood pressure 98/57, pulse 88, temperature 98.4 F (36.9 C), temperature source Oral, resp. rate 21, height 6' (1.829 m), weight 79.6 kg (175 lb 7.8 oz), SpO2 96.00%.  Intake/Output Summary (Last 24 hours) at 09/08/13 1433 Last data filed at 09/08/13 0700  Gross per 24 hour  Intake    700 ml  Output    300 ml  Net    400 ml   Exam: General: No acute respiratory distress Lungs: Clear to auscultation bilaterally without wheezes or crackles, RA Cardiovascular: Regular rate and rhythm without murmur gallop or rub normal S1 and S2, no peripheral edema or JVD, IV fluid at 100 cc per hour Abdomen: Tender lower abdomen without guarding or rebounding, nondistended, soft, hypoactive bowel sounds positive, no rebound, no ascites, no appreciable mass Musculoskeletal: No significant cyanosis, clubbing of bilateral lower extremities Neurological: Alert and oriented x 3, moves all extremities x 4 without focal neurological deficits, CN 2-12 intact  Scheduled Meds:  Scheduled Meds: . [START ON 09/09/2013] azaTHIOprine  100 mg Oral Daily  . carvedilol  6.25 mg Oral BID  . ertapenem  1 g Intravenous Q24H  . heparin  5,000  Units Subcutaneous Q8H  . predniSONE  5 mg Oral Daily  . sodium chloride  3 mL Intravenous Q12H  . tacrolimus  2 mg Oral BID   Data Reviewed: Basic Metabolic Panel:  Recent Labs Lab 09/07/13 1142 09/08/13 0340  NA 130* 135  K 4.1 5.2*  CL 92* 102  CO2 27 24  GLUCOSE 102* 97  BUN 37* 36*  CREATININE 2.06* 1.84*  CALCIUM 9.2 8.4   Liver Function Tests:  Recent Labs Lab 09/07/13 1142 09/08/13 0340  AST 14 13  ALT 11 9  ALKPHOS 54 43  BILITOT 0.7 0.5  PROT 6.5 5.3*  ALBUMIN 3.3* 2.6*   CBC:  Recent Labs Lab 09/07/13 1142 09/08/13 0340  WBC 13.2* 13.0*  NEUTROABS 12.0*  --   HGB 11.5* 9.8*  HCT 33.9* 28.6*  MCV 99.7 98.6  PLT 240 207    Recent Results (from the past 240 hour(s))  CULTURE, BLOOD (ROUTINE X 2)     Status: None   Collection Time    09/07/13 11:41 AM      Result Value Range Status   Specimen Description     Final   Value: BLOOD BOTTLES DRAWN AEROBIC AND ANAEROBIC LEFT ANTECUBITAL   Special Requests 8CC   Final   Culture NO GROWTH 1 DAY   Final   Report Status PENDING   Incomplete  CULTURE, BLOOD (ROUTINE X 2)     Status: None   Collection Time    09/07/13 11:43 AM      Result Value Range Status   Specimen Description     Final   Value: BLOOD BOTTLES DRAWN AEROBIC AND ANAEROBIC RIGHT ANTECUBITAL   Special Requests 12CC   Final   Culture NO GROWTH 1 DAY   Final   Report Status PENDING   Incomplete  MRSA PCR SCREENING     Status: None   Collection Time    09/07/13  7:23 PM      Result Value Range Status   MRSA by PCR NEGATIVE  NEGATIVE Final   Comment:            The GeneXpert MRSA Assay (FDA     approved for NASAL specimens     only), is one component of a     comprehensive MRSA colonization     surveillance program. It is not     intended to diagnose MRSA     infection nor to guide or     monitor treatment for     MRSA infections.     Studies:  Recent x-ray studies have been reviewed in detail by the Attending  Physician  Junious Silk, ANP [facility] Office  [phone] Pager [phone]  **If unable to reach the above provider after paging please contact the Flow Manager @ [phone]  On-Call/Text Page:      [email or web address]      password [email or web address]  If 7PM-7AM, please contact night-coverage [email or web address] Password [email or web address] 09/08/2013, 2:33 PM   LOS: 1 day   I have personally examined this patient and reviewed the entire database. I have reviewed the above note, made any necessary editorial changes, and agree with its content.  Lonia Blood, MD [facility]

## 2013-09-08 NOTE — Progress Notes (Signed)
Echocardiogram 2D Echocardiogram with Definity has been performed.  Chelsae Zanella 09/08/2013, 3:35 PM

## 2013-09-09 ENCOUNTER — Encounter (HOSPITAL_COMMUNITY): Payer: Self-pay | Admitting: Certified Registered"

## 2013-09-09 ENCOUNTER — Inpatient Hospital Stay (HOSPITAL_COMMUNITY): Payer: Medicare Other

## 2013-09-09 ENCOUNTER — Encounter (HOSPITAL_COMMUNITY)
Admission: EM | Disposition: A | Discharge status: Other Acute Inpatient Hospital | Payer: Self-pay | Source: Home / Self Care | Attending: Internal Medicine

## 2013-09-09 ENCOUNTER — Inpatient Hospital Stay (HOSPITAL_COMMUNITY): Payer: Medicare Other | Admitting: Certified Registered"

## 2013-09-09 HISTORY — PX: COLON RESECTION: SHX5231

## 2013-09-09 LAB — BASIC METABOLIC PANEL
BUN: 35 mg/dL — ABNORMAL HIGH (ref 6–23)
Calcium: 8.3 mg/dL — ABNORMAL LOW (ref 8.4–10.5)
Chloride: 101 mEq/L (ref 96–112)
Creatinine, Ser: 1.56 mg/dL — ABNORMAL HIGH (ref 0.50–1.35)
GFR calc Af Amer: 51 mL/min — ABNORMAL LOW (ref >90)
GFR calc non Af Amer: 44 mL/min — ABNORMAL LOW (ref >90)
Sodium: 134 mEq/L — ABNORMAL LOW (ref 135–145)

## 2013-09-09 LAB — CBC
HCT: 29.6 % — ABNORMAL LOW (ref 39.0–52.0)
MCHC: 34.5 g/dL (ref 30.0–36.0)
MCV: 97.4 fL (ref 78.0–100.0)
RDW: 13.1 % (ref 11.5–15.5)
WBC: 9.6 10*3/uL (ref 4.0–10.5)

## 2013-09-09 LAB — ABO/RH: ABO/RH(D): O POS

## 2013-09-09 LAB — URINE CULTURE

## 2013-09-09 LAB — TYPE AND SCREEN: Antibody Screen: NEGATIVE

## 2013-09-09 SURGERY — COLON RESECTION LAPAROSCOPIC
Anesthesia: General | Site: Abdomen | Wound class: Clean Contaminated

## 2013-09-09 MED ORDER — ACETAMINOPHEN 325 MG PO TABS: 975.0000 mg | ORAL_TABLET | ORAL | Status: DC | PRN

## 2013-09-09 MED ORDER — NALOXONE HCL 0.4 MG/ML IJ SOLN
0.4000 mg | INTRAMUSCULAR | Status: DC | PRN
  Administered 2013-09-09: 0.4 mg via INTRAVENOUS
  Filled 2013-09-09: qty 1

## 2013-09-09 MED ORDER — DIPHENHYDRAMINE HCL 12.5 MG/5ML PO ELIX
12.5000 mg | ORAL_SOLUTION | Freq: Four times a day (QID) | ORAL | Status: DC | PRN
  Filled 2013-09-09: qty 5

## 2013-09-09 MED ORDER — SUFENTANIL CITRATE 50 MCG/ML IV SOLN
INTRAVENOUS | Status: DC | PRN
  Administered 2013-09-09: 10 ug via INTRAVENOUS
  Administered 2013-09-09: 20 ug via INTRAVENOUS
  Administered 2013-09-09: 10 ug via INTRAVENOUS

## 2013-09-09 MED ORDER — HYDROMORPHONE HCL PF 1 MG/ML IJ SOLN
0.2500 mg | INTRAMUSCULAR | Status: DC | PRN
  Administered 2013-09-09 (×3): 0.5 mg via INTRAVENOUS
  Filled 2013-09-09: qty 0.5

## 2013-09-09 MED ORDER — SODIUM CHLORIDE 0.9 % IR SOLN
Status: DC | PRN
  Administered 2013-09-09: 1

## 2013-09-09 MED ORDER — BUPIVACAINE-EPINEPHRINE PF 0.25-1:200000 % IJ SOLN
INTRAMUSCULAR | Status: AC
  Filled 2013-09-09: qty 30

## 2013-09-09 MED ORDER — HYDROMORPHONE HCL PF 1 MG/ML IJ SOLN
0.2500 mg | INTRAMUSCULAR | Status: DC | PRN
  Administered 2013-09-09 (×4): 0.5 mg via INTRAVENOUS

## 2013-09-09 MED ORDER — BUPIVACAINE-EPINEPHRINE 0.25% -1:200000 IJ SOLN
INTRAMUSCULAR | Status: DC | PRN
  Administered 2013-09-09: 8 mL

## 2013-09-09 MED ORDER — 0.9 % SODIUM CHLORIDE (POUR BTL) OPTIME
TOPICAL | Status: DC | PRN
  Administered 2013-09-09 (×4): 1000 mL

## 2013-09-09 MED ORDER — SODIUM CHLORIDE 0.9 % IV SOLN
10.0000 mg | INTRAVENOUS | Status: DC | PRN
  Administered 2013-09-09: 40 ug/min via INTRAVENOUS

## 2013-09-09 MED ORDER — ONDANSETRON HCL 4 MG/2ML IJ SOLN
4.0000 mg | Freq: Four times a day (QID) | INTRAMUSCULAR | Status: DC | PRN
  Filled 2013-09-09: qty 2

## 2013-09-09 MED ORDER — VECURONIUM BROMIDE 10 MG IV SOLR
INTRAVENOUS | Status: DC | PRN
  Administered 2013-09-09: 1 mg via INTRAVENOUS
  Administered 2013-09-09: 2 mg via INTRAVENOUS
  Administered 2013-09-09 (×4): 1 mg via INTRAVENOUS

## 2013-09-09 MED ORDER — SODIUM CHLORIDE 0.9 % IJ SOLN: 9.0000 mL | INTRAMUSCULAR | Status: DC | PRN

## 2013-09-09 MED ORDER — MIDAZOLAM HCL 5 MG/5ML IJ SOLN
INTRAMUSCULAR | Status: DC | PRN
  Administered 2013-09-09: 2 mg via INTRAVENOUS

## 2013-09-09 MED ORDER — OXYCODONE HCL 5 MG PO TABS: 5.0000 mg | ORAL_TABLET | Freq: Once | ORAL | Status: DC | PRN

## 2013-09-09 MED ORDER — ONDANSETRON HCL 4 MG/2ML IJ SOLN: 4.0000 mg | Freq: Four times a day (QID) | INTRAMUSCULAR | Status: DC | PRN

## 2013-09-09 MED ORDER — LACTATED RINGERS IV SOLN
INTRAVENOUS | Status: DC
  Administered 2013-09-09 (×3): via INTRAVENOUS

## 2013-09-09 MED ORDER — ONDANSETRON HCL 4 MG/2ML IJ SOLN
INTRAMUSCULAR | Status: DC | PRN
  Administered 2013-09-09: 4 mg via INTRAVENOUS

## 2013-09-09 MED ORDER — METOPROLOL TARTRATE 1 MG/ML IV SOLN
5.0000 mg | Freq: Four times a day (QID) | INTRAVENOUS | Status: DC
  Administered 2013-09-09 – 2013-09-23 (×55): 5 mg via INTRAVENOUS
  Filled 2013-09-09 (×59): qty 5

## 2013-09-09 MED ORDER — NALOXONE HCL 0.4 MG/ML IJ SOLN: 0.4000 mg | INTRAMUSCULAR | Status: DC | PRN

## 2013-09-09 MED ORDER — HYDROMORPHONE 0.3 MG/ML IV SOLN
INTRAVENOUS | Status: DC
  Administered 2013-09-09: 17:00:00 via INTRAVENOUS
  Administered 2013-09-09: 0.3 mg via INTRAVENOUS
  Administered 2013-09-10: 0.9 mg via INTRAVENOUS
  Administered 2013-09-10: 1.8 mg via INTRAVENOUS
  Administered 2013-09-10 (×2): 0.6 mg via INTRAVENOUS
  Administered 2013-09-10: 1.2 mg via INTRAVENOUS
  Administered 2013-09-10: 0.6 mg via INTRAVENOUS
  Administered 2013-09-11: 1.2 mg via INTRAVENOUS
  Administered 2013-09-11: 2.1 mg via INTRAVENOUS
  Administered 2013-09-11: 1.5 mg via INTRAVENOUS
  Administered 2013-09-11: 0.9 mg via INTRAVENOUS
  Filled 2013-09-09: qty 25

## 2013-09-09 MED ORDER — TACROLIMUS 1 MG PO CAPS
1.0000 mg | ORAL_CAPSULE | Freq: Two times a day (BID) | ORAL | Status: DC
  Administered 2013-09-09 – 2013-09-15 (×12): 1 mg via SUBLINGUAL
  Filled 2013-09-09 (×13): qty 1

## 2013-09-09 MED ORDER — PROPOFOL 10 MG/ML IV BOLUS
INTRAVENOUS | Status: DC | PRN
  Administered 2013-09-09: 150 mg via INTRAVENOUS

## 2013-09-09 MED ORDER — ROCURONIUM BROMIDE 100 MG/10ML IV SOLN
INTRAVENOUS | Status: DC | PRN
  Administered 2013-09-09: 50 mg via INTRAVENOUS

## 2013-09-09 MED ORDER — OXYCODONE HCL 5 MG/5ML PO SOLN: 5.0000 mg | Freq: Once | ORAL | Status: DC | PRN

## 2013-09-09 MED ORDER — GLYCOPYRROLATE 0.2 MG/ML IJ SOLN
INTRAMUSCULAR | Status: DC | PRN
  Administered 2013-09-09: .5 mg via INTRAVENOUS

## 2013-09-09 MED ORDER — NALOXONE HCL 0.4 MG/ML IJ SOLN
INTRAMUSCULAR | Status: AC
  Filled 2013-09-09: qty 1

## 2013-09-09 MED ORDER — DIPHENHYDRAMINE HCL 50 MG/ML IJ SOLN
12.5000 mg | Freq: Four times a day (QID) | INTRAMUSCULAR | Status: DC | PRN
  Filled 2013-09-09: qty 0.25

## 2013-09-09 MED ORDER — HEPARIN SODIUM (PORCINE) 5000 UNIT/ML IJ SOLN
5000.0000 [IU] | Freq: Three times a day (TID) | INTRAMUSCULAR | Status: DC
  Administered 2013-09-10 – 2013-09-20 (×31): 5000 [IU] via SUBCUTANEOUS
  Filled 2013-09-09 (×34): qty 1

## 2013-09-09 MED ORDER — NEOSTIGMINE METHYLSULFATE 1 MG/ML IJ SOLN
INTRAMUSCULAR | Status: DC | PRN
  Administered 2013-09-09: 3 mg via INTRAVENOUS

## 2013-09-09 MED ORDER — LIDOCAINE HCL (CARDIAC) 20 MG/ML IV SOLN
INTRAVENOUS | Status: DC | PRN
  Administered 2013-09-09: 75 mg via INTRAVENOUS

## 2013-09-09 MED ORDER — TACROLIMUS 1 MG/ML ORAL SUSPENSION
1.0000 mg | Freq: Two times a day (BID) | ORAL | Status: DC
  Filled 2013-09-09: qty 1

## 2013-09-09 MED ORDER — PHENYLEPHRINE HCL 10 MG/ML IJ SOLN
INTRAMUSCULAR | Status: DC | PRN
  Administered 2013-09-09 (×5): 80 ug via INTRAVENOUS

## 2013-09-09 MED ORDER — ALBUMIN HUMAN 5 % IV SOLN
INTRAVENOUS | Status: DC | PRN
  Administered 2013-09-09 (×2): via INTRAVENOUS

## 2013-09-09 SURGICAL SUPPLY — 83 items
ADH SKN CLS APL DERMABOND .7 (GAUZE/BANDAGES/DRESSINGS) ×1
APPLIER CLIP ROT 10 11.4 M/L (STAPLE)
APR CLP MED LRG 11.4X10 (STAPLE)
BLADE SURG 10 STRL SS (BLADE) ×2 IMPLANT
BLADE SURG ROTATE 9660 (MISCELLANEOUS) IMPLANT
BRR ADH 5X3 SEPRAFILM 6 SHT (MISCELLANEOUS) ×1
CANISTER SUCTION 2500CC (MISCELLANEOUS) ×2 IMPLANT
CELLS DAT CNTRL 66122 CELL SVR (MISCELLANEOUS) IMPLANT
CHLORAPREP W/TINT 26ML (MISCELLANEOUS) ×2 IMPLANT
CLIP APPLIE ROT 10 11.4 M/L (STAPLE) IMPLANT
CLOTH BEACON ORANGE TIMEOUT ST (SAFETY) ×2 IMPLANT
COVER MAYO STAND STRL (DRAPES) ×2 IMPLANT
COVER SURGICAL LIGHT HANDLE (MISCELLANEOUS) ×2 IMPLANT
DECANTER SPIKE VIAL GLASS SM (MISCELLANEOUS) ×2 IMPLANT
DERMABOND ADVANCED (GAUZE/BANDAGES/DRESSINGS) ×1
DERMABOND ADVANCED .7 DNX12 (GAUZE/BANDAGES/DRESSINGS) IMPLANT
DISSECTOR BLUNT TIP ENDO 5MM (MISCELLANEOUS) IMPLANT
DRAPE PROXIMA HALF (DRAPES) IMPLANT
DRAPE UTILITY 15X26 W/TAPE STR (DRAPE) ×6 IMPLANT
DRAPE WARM FLUID 44X44 (DRAPE) ×2 IMPLANT
DRSG PAD ABDOMINAL 8X10 ST (GAUZE/BANDAGES/DRESSINGS) ×1 IMPLANT
ELECT BLADE 6.5 EXT (BLADE) ×1 IMPLANT
ELECT CAUTERY BLADE 6.4 (BLADE) ×4 IMPLANT
ELECT REM PT RETURN 9FT ADLT (ELECTROSURGICAL) ×2
ELECTRODE REM PT RTRN 9FT ADLT (ELECTROSURGICAL) ×1 IMPLANT
GEL ULTRASOUND 20GR AQUASONIC (MISCELLANEOUS) IMPLANT
GLOVE BIO SURGEON STRL SZ 6.5 (GLOVE) ×1 IMPLANT
GLOVE BIOGEL M 8.0 STRL (GLOVE) ×2 IMPLANT
GLOVE BIOGEL M STRL SZ7.5 (GLOVE) ×3 IMPLANT
GLOVE BIOGEL PI IND STRL 8 (GLOVE) ×2 IMPLANT
GLOVE BIOGEL PI INDICATOR 8 (GLOVE) ×2
GOWN STRL NON-REIN LRG LVL3 (GOWN DISPOSABLE) ×7 IMPLANT
GOWN STRL REIN XL XLG (GOWN DISPOSABLE) ×4 IMPLANT
KIT BASIN OR (CUSTOM PROCEDURE TRAY) ×2 IMPLANT
KIT OSTOMY DRAINABLE 2.75 STR (WOUND CARE) ×1 IMPLANT
KIT ROOM TURNOVER OR (KITS) ×2 IMPLANT
LEGGING LITHOTOMY PAIR STRL (DRAPES) IMPLANT
LIGASURE 5MM LAPAROSCOPIC (INSTRUMENTS) IMPLANT
LIGASURE IMPACT 36 18CM CVD LR (INSTRUMENTS) IMPLANT
NS IRRIG 1000ML POUR BTL (IV SOLUTION) ×4 IMPLANT
PAD ARMBOARD 7.5X6 YLW CONV (MISCELLANEOUS) ×4 IMPLANT
PAD SHARPS MAGNETIC DISPOSAL (MISCELLANEOUS) ×2 IMPLANT
PENCIL BUTTON HOLSTER BLD 10FT (ELECTRODE) ×3 IMPLANT
RELOAD PROXIMATE 75MM BLUE (ENDOMECHANICALS) ×2 IMPLANT
RELOAD STAPLE 75 3.8 BLU REG (ENDOMECHANICALS) IMPLANT
RETRACTOR WND ALEXIS 18 MED (MISCELLANEOUS) IMPLANT
RTRCTR WOUND ALEXIS 18CM MED (MISCELLANEOUS)
SCALPEL HARMONIC ACE (MISCELLANEOUS) IMPLANT
SCISSORS LAP 5X35 DISP (ENDOMECHANICALS) IMPLANT
SEPRAFILM PROCEDURAL PACK 3X5 (MISCELLANEOUS) ×1 IMPLANT
SET IRRIG TUBING LAPAROSCOPIC (IRRIGATION / IRRIGATOR) IMPLANT
SLEEVE ENDOPATH XCEL 5M (ENDOMECHANICALS) ×2 IMPLANT
SPECIMEN JAR LARGE (MISCELLANEOUS) ×2 IMPLANT
SPONGE GAUZE 4X4 12PLY (GAUZE/BANDAGES/DRESSINGS) ×1 IMPLANT
SPONGE LAP 18X18 X RAY DECT (DISPOSABLE) ×2 IMPLANT
STAPLER PROXIMATE 75MM BLUE (STAPLE) ×1 IMPLANT
STAPLER VISISTAT 35W (STAPLE) ×2 IMPLANT
SURGILUBE 2OZ TUBE FLIPTOP (MISCELLANEOUS) IMPLANT
SUT PDS AB 1 TP1 96 (SUTURE) ×4 IMPLANT
SUT PROLENE 2 0 CT2 30 (SUTURE) ×1 IMPLANT
SUT PROLENE 2 0 KS (SUTURE) IMPLANT
SUT SILK 2 0 (SUTURE) ×2
SUT SILK 2 0 SH CR/8 (SUTURE) ×2 IMPLANT
SUT SILK 2-0 18XBRD TIE 12 (SUTURE) ×1 IMPLANT
SUT SILK 3 0 (SUTURE) ×2
SUT SILK 3 0 SH CR/8 (SUTURE) ×2 IMPLANT
SUT SILK 3-0 18XBRD TIE 12 (SUTURE) ×1 IMPLANT
SUT VIC AB 3-0 SH 18 (SUTURE) ×1 IMPLANT
SUT VIC AB 3-0 SH 8-18 (SUTURE) IMPLANT
SYR BULB IRRIGATION 50ML (SYRINGE) ×3 IMPLANT
SYS LAPSCP GELPORT 120MM (MISCELLANEOUS)
SYSTEM LAPSCP GELPORT 120MM (MISCELLANEOUS) IMPLANT
TAPE CLOTH SURG 4X10 WHT LF (GAUZE/BANDAGES/DRESSINGS) ×1 IMPLANT
TOWEL OR 17X26 10 PK STRL BLUE (TOWEL DISPOSABLE) ×4 IMPLANT
TRAY FOLEY CATH 14FRSI W/METER (CATHETERS) IMPLANT
TRAY LAPAROSCOPIC (CUSTOM PROCEDURE TRAY) ×2 IMPLANT
TRAY PROCTOSCOPIC FIBER OPTIC (SET/KITS/TRAYS/PACK) IMPLANT
TROCAR XCEL NON-BLD 11X100MML (ENDOMECHANICALS) ×2 IMPLANT
TROCAR XCEL NON-BLD 5MMX100MML (ENDOMECHANICALS) ×2 IMPLANT
TUBE CONNECTING 12X1/4 (SUCTIONS) ×2 IMPLANT
TUBING FILTER THERMOFLATOR (ELECTROSURGICAL) ×2 IMPLANT
WATER STERILE IRR 1000ML POUR (IV SOLUTION) IMPLANT
YANKAUER SUCT BULB TIP NO VENT (SUCTIONS) ×4 IMPLANT

## 2013-09-09 NOTE — Anesthesia Postprocedure Evaluation (Signed)
  Anesthesia Post-op Note  Patient: Peter Clarke  Procedure(s) Performed: Procedure(s): Lap. Assisted Colectomy, lysis of adhesions, Colostomy (N/A)  Patient Location: PACU  Anesthesia Type:General  Level of Consciousness: awake, oriented, sedated and patient cooperative  Airway and Oxygen Therapy: Patient Spontanous Breathing  Post-op Pain: moderate  Post-op Assessment: Post-op Vital signs reviewed, Patient's Cardiovascular Status Stable, Respiratory Function Stable, Patent Airway, No signs of Nausea or vomiting and Pain level controlled  Post-op Vital Signs: stable  Complications: No apparent anesthesia complications

## 2013-09-09 NOTE — Preoperative (Signed)
Beta Blockers   Reason not to administer Beta Blockers:Carvedilol given at 0926 hrs on 09/09/13

## 2013-09-09 NOTE — Anesthesia Procedure Notes (Signed)
Procedure Name: Intubation Date/Time: 09/09/2013 12:27 PM Performed by: Charm Barges, Darnice Comrie R Pre-anesthesia Checklist: Patient identified, Emergency Drugs available, Suction available, Patient being monitored and Timeout performed Patient Re-evaluated:Patient Re-evaluated prior to inductionOxygen Delivery Method: Circle system utilized Preoxygenation: Pre-oxygenation with 100% oxygen Intubation Type: IV induction Ventilation: Mask ventilation without difficulty Laryngoscope Size: Mac and 4 Grade View: Grade II Tube type: Oral Tube size: 8.0 mm Number of attempts: 1 Airway Equipment and Method: Stylet Placement Confirmation: ETT inserted through vocal cords under direct vision,  positive ETCO2 and breath sounds checked- equal and bilateral Secured at: 23 cm Tube secured with: Tape Dental Injury: Teeth and Oropharynx as per pre-operative assessment

## 2013-09-09 NOTE — Progress Notes (Signed)
[facility] Progress Note [place] TEAM 1 - Stepdown ICU Team   Peter Clarke ZOX:[id number] DOB: [birth year] DOA: 09/07/2013 PCP: [name], MD  Brief narrative: 67 year old male with remote history of cardiac transplant at [name] 11 years ago on chronic immunosuppressive therapy. 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 He also has underlying chronic kidney disease stage III. He presented to [facility] with abdominal pain associated with diarrhea but no melena or hematochezia and no nausea/vomiting. He initially presented to [facility] where CT of the abdomen and pelvis revealed abnormal thickening of the sigmoid colon with perforation. Given his underlying medical history she was subsequently transferred to [facility] for further treatment. After arrival here he has been evaluated by Dr. [name] with surgery who recommended conservative treatment with bowel rest and IV antibiotics. In review of his laboratory values he had a leukocytosis with white count of 13,000 and creatinine had increased to 2.96 from 1.57.  Assessment/Plan:  Diverticulitis with perforation -per CCS-pt with severe pain -CCS plans EL/colostomy today (9/23)  Fever/Leukocytosis -see above  History of heart transplant/chronic immunosuppression/history of Chronic Systolic heart failure/New grade 2 DD -requested Cardiology pre op eval -Appreciate Cards assistance -ECHO this admit showed resolution of systolic dysfunction post transplant but new grade 2 DD therefore watch IVF post op -BP soft so Cards has decreased Coreg and held Norvasc (9/22)  Head and neck cancer -tx'd at [place] -Imuran dose decreased to 100 mg Daily in August when dx'd with throat cancer. -Dr. [name] also d/w pt's transplant cardiologist at [facility] who recommended switching Prograf to 1 mg BID SL post op for better absorption -continue stress  dose steroids  HTN (hypertension) -see above  CKD (chronic kidney disease) stage 3, GFR 30-59 ml/min -follow after gentle IVF hydration-slowly improving   DVT prophylaxis: Subcutaneous heparin Code Status: Full Family Communication: Patient and family at bedside Disposition Plan/Expected LOS: Stepdown  Consultants: Cardiology General surgery  Procedures: 2-D echocardiogram - Left ventricle: The cavity size was normal. Wall thickness was normal. Systolic function was normal. The estimated ejection fraction was in the range of 60% to 65%. Wall motion was normal; there were no regional wall motion abnormalities. Features are consistent with a pseudonormal left ventricular filling pattern, with concomitant abnormal relaxation and increased filling pressure (grade 2 diastolic dysfunction). - Aortic valve: There was no stenosis. - Mitral valve: Trivial regurgitation. - Left atrium: The atrium was mildly dilated. - Right ventricle: Poorly visualized. The cavity size was normal. Systolic function was normal. - Right atrium: The atrium was mildly dilated. - Pulmonary arteries: No complete TR doppler jet so unable to estimate PA systolic pressure. - Systemic veins: IVC was not visualized.   Antibiotics: Ertapenem 9/21 >>>  Flagyl 9/21  Zosyn 9/21   HPI/Subjective: Patient alert- pain continues- going for surgery.   Objective: Blood pressure 140/76, pulse 97, temperature 99.9 F (37.7 C), temperature source Oral, resp. rate 19, height 6' (1.829 m), weight 79.6 kg (175 lb 7.8 oz), SpO2 98.00%.  Intake/Output Summary (Last 24 hours) at 09/09/13 1356 Last data filed at 09/09/13 1330  Gross per 24 hour  Intake   1800 ml  Output   1550 ml  Net    250 ml   Exam: General: No acute respiratory distress Lungs: Clear to auscultation bilaterally without wheezes or crackles, RA Cardiovascular: Regular rate and rhythm without murmur gallop or rub normal S1 and S2, no peripheral  edema or JVD, IV fluid at 100 cc per hour Abdomen: Tender lower abdomen without guarding or rebounding, nondistended, soft, hypoactive bowel sounds positive, no rebound, no ascites, no appreciable mass Musculoskeletal: No significant cyanosis, clubbing of bilateral lower extremities Neurological: Alert and oriented x 3, moves all extremities x 4 without focal neurological deficits, CN 2-12 intact  Scheduled Meds:  Scheduled Meds: . [facility] HOLD] azaTHIOprine  100 mg Oral Daily  . [facility] HOLD] carvedilol  6.25 mg Oral BID  . [[facility] HOLD] ertapenem  1 g Intravenous Q24H  . [[facility] HOLD] heparin  5,000 Units Subcutaneous Q8H  . [[facility] HOLD] predniSONE  5 mg Oral Daily  . [[facility] HOLD] sodium chloride  3 mL Intravenous Q12H  . [facility] HOLD] tacrolimus  2 mg Oral BID   Data Reviewed: Basic Metabolic Panel:  Recent Labs Lab 09/07/13 1142 09/08/13 0340 09/09/13 0520  NA 130* 135 134*  K 4.1 5.2* 4.2  CL 92* 102 101  CO2 27 24 21   GLUCOSE 102* 97 92  BUN 37* 36* 35*  CREATININE 2.06* 1.84* 1.56*  CALCIUM 9.2 8.4 8.3*   Liver Function Tests:  Recent Labs Lab 09/07/13 1142 09/08/13 0340  AST 14 13  ALT 11 9  ALKPHOS 54 43  BILITOT 0.7 0.5  PROT 6.5 5.3*  ALBUMIN 3.3* 2.6*   CBC:  Recent Labs Lab 09/07/13 1142 09/08/13 0340 09/09/13 0520  WBC 13.2* 13.0* 9.6  NEUTROABS 12.0*  --   --   HGB 11.5* 9.8* 10.2*  HCT 33.9* 28.6* 29.6*  MCV 99.7 98.6 97.4  PLT 240 207 194    Recent Results (from the past 240 hour(s))  CULTURE, BLOOD (ROUTINE X 2)     Status: None   Collection Time    09/07/13 11:41 AM      Result Value Range Status   Specimen Description BLOOD LEFT ANTECUBITAL   Final   Special Requests BOTTLES DRAWN AEROBIC AND ANAEROBIC 8CC   Final   Culture NO GROWTH 2 DAYS   Final   Report Status PENDING   Incomplete  CULTURE, BLOOD (ROUTINE X 2)     Status: None   Collection Time    09/07/13 11:43 AM      Result Value Range Status   Specimen Description BLOOD RIGHT  ANTECUBITAL   Final   Special Requests BOTTLES DRAWN AEROBIC AND ANAEROBIC 12CC   Final   Culture NO GROWTH 2 DAYS   Final   Report Status PENDING   Incomplete  MRSA PCR SCREENING     Status: None   Collection Time    09/07/13  7:23 PM      Result Value Range Status   MRSA by PCR NEGATIVE  NEGATIVE Final   Comment:            The GeneXpert MRSA Assay (FDA     approved for NASAL specimens     only), is one component of a     comprehensive MRSA colonization     surveillance program. It is not     intended to diagnose MRSA     infection nor to guide or     monitor treatment for     MRSA infections.     Studies:  Recent x-ray studies have been reviewed in detail by the Attending Physician  Junious Silk, ANP [facility] Office  [phone] Pager [phone]  **If unable to reach the above provider after paging please contact the Flow Manager @ [phone]  On-Call/Text  Page:      [email or web address]      password [email or web address]  If 7PM-7AM, please contact night-coverage [email or web address] Password [facility] 09/09/2013, 1:56 PM   LOS: 2 days   I have examined the patient, reviewed the chart and modified the above note which I agree with.   Peter Pillard,MD [phone] 09/09/2013, 6:11 PM

## 2013-09-09 NOTE — Brief Op Note (Signed)
09/07/2013 - 09/09/2013  4:31 PM  PATIENT:  Peter Clarke  67 y.o. male  PRE-OPERATIVE DIAGNOSIS:  Perforated Sigmoid Diverticulitis  POST-OPERATIVE DIAGNOSIS:  Perforated Sigmoid Diverticulitis  PROCEDURE:  Laparoscopic assisted Hartman's procedure (sigmoid colectomy with end colostomy), lysis of adhesions x 1 hr  SURGEON:  Surgeon(s) and Role:    * Atilano Ina, MD - Primary  PHYSICIAN ASSISTANT: Ashok Norris, NP  ASSISTANTS: none   ANESTHESIA:   general  EBL:  Total I/O In: 3600 [I.V.:3100; IV Piggyback:500] Out: 680 [Urine:580; Blood:100]  BLOOD ADMINISTERED:none  DRAINS: Urinary Catheter (Foley)   LOCAL MEDICATIONS USED:  MARCAINE     SPECIMEN:  Source of Specimen:  sigmoid colon, stitch proximal  DISPOSITION OF SPECIMEN:  PATHOLOGY  COUNTS:  YES  TOURNIQUET:  * No tourniquets in log *  DICTATION: .Other Dictation: Dictation Number [id number]  PLAN OF CARE: Admit to inpatient   PATIENT DISPOSITION:  PACU - hemodynamically stable.   Delay start of Pharmacological VTE agent (>24hrs) due to surgical blood loss or risk of bleeding: no

## 2013-09-09 NOTE — Progress Notes (Signed)
Subjective: Febrile to 102 last night; AF now; reports pain is a little worse today. No n/v. "just wants pain to stop"  Objective: Vital signs in last 24 hours: Temp:  [98.4 F (36.9 C)-102.4 F (39.1 C)] 98.6 F (37 C) (09/23 0430) Pulse Rate:  [85-102] 94 (09/23 0430) Resp:  [15-24] 19 (09/23 0430) BP: (98-149)/(57-120) 115/68 mmHg (09/23 0430) SpO2:  [95 %-96 %] 96 % (09/23 0430) Last BM Date: 09/08/13  Intake/Output from previous day: 09/22 0701 - 09/23 0700 In: 1400 [I.V.:1300; IV Piggyback:100] Out: 1000 [Urine:1000] Intake/Output this shift:    Alert, sitting in chair Cta b/l Reg Soft, nd. TTP upper midline; L side; suprapubic; no peritonitis. +guarding. More TTP today  Lab Results:   Recent Labs  09/08/13 0340 09/09/13 0520  WBC 13.0* 9.6  HGB 9.8* 10.2*  HCT 28.6* 29.6*  PLT 207 194   BMET  Recent Labs  09/08/13 0340 09/09/13 0520  NA 135 134*  K 5.2* 4.2  CL 102 101  CO2 24 21  GLUCOSE 97 92  BUN 36* 35*  CREATININE 1.84* 1.56*  CALCIUM 8.4 8.3*   PT/INR  Recent Labs  09/08/13 0340 09/09/13 0520  LABPROT 19.0* 18.9*  INR 1.64* 1.63*   ABG No results found for this basename: PHART, PCO2, PO2, HCO3,  in the last 72 hours  Studies/Results: Ct Abdomen Pelvis Wo Contrast  09/07/2013   *RADIOLOGY REPORT*  Clinical Data: Low abdomen pain.  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  CT ABDOMEN AND PELVIS WITHOUT CONTRAST  Technique:  Multidetector CT imaging of the abdomen and pelvis was performed following the standard protocol without intravenous contrast.  Comparison: February 11, 2009.  Findings: There is abnormal thickening of the sigmoid colon with perforation.  There is free air surrounding the sigmoid colon with air extending superiorly. There is no small bowel obstruction.  There are several low density lesions within the liver, most are present on the prior CT of February 06, 2009.   Evaluation is limited without contrast.  The spleen, pancreas, gallbladder, adrenal glands are normal.  There is no nephrolithiasis or hydroureternephrosis  bilaterally.  There is atherosclerosis of the abdominal aorta without aneurysmal dilatation.  Images of the pelvis demonstrate partial fluid filled bladder without abnormality.  Left inguinal herniation of mesenteric fat is identified measuring 5.6 cm.  There is mild scarring of the posterior lung bases with mild dependent atelectasis of the right lung base. There is minimal right pleural effusion . Degenerative joint changes of the spine are noted.  IMPRESSION: Abnormal thickening of sigmoid colon with perforation. Differential diagnosis includes diverticulitis with perforation versus colon cancer with perforation.   Original Report Authenticated By: Sherian Rein, M.D.    Anti-infectives: Anti-infectives   Start     Dose/Rate Route Frequency Ordered Stop   09/07/13 2000  ertapenem (INVANZ) 1 g in sodium chloride 0.9 % 50 mL IVPB     1 g 100 mL/hr over 30 Minutes Intravenous Every 24 hours 09/07/13 1914     09/07/13 1430  piperacillin-tazobactam (ZOSYN) IVPB 3.375 g     3.375 g 12.5 mL/hr over 240 Minutes Intravenous  Once 09/07/13 1425 09/07/13 1516   09/07/13 1430  metroNIDAZOLE (FLAGYL) IVPB 500 mg     500 mg 100 mL/hr over 60 Minutes Intravenous  Once 09/07/13 1425 09/07/13 1617      Assessment/Plan: Sigmoid diverticulitis with microperforation H/o cardiac tx on immunosuppression Tonsillar cancer  CKD  Although wbc is better, pain is worse today; abd exam is more tender today. I believe we have failed non-operative management especially in the setting of his immunosuppression.   I have recommended sigmoid colectomy with colostomy (hartman's procedure)  I discussed the procedure in detail.   We discussed the risks and benefits of surgery including, but not limited to bleeding, infection (such as wound infection, abdominal abscess),  injury to surrounding structures, blood clot formation, urinary retention, incisional hernia, ostomy problems (hernia/ischemia), heart rejection,  anesthesia risks, pulmonary & cardiac complications such as pneumonia &/or heart attack, need for additional procedures, ileus, & prolonged hospitalization.  We discussed the typical postoperative recovery course, including limitations & restrictions postoperatively. I explained that the likelihood of improvement in their symptoms is good.  Type and cross - may need FFP - but will make decision intraop Ostomy marking by wound care nurse Consent  Peter Clarke. [name], MD, FACS General, Bariatric, & Minimally Invasive Surgery [facility], [place]   LOS: 2 days    Peter Clarke 09/09/2013

## 2013-09-09 NOTE — Progress Notes (Signed)
Dilaudid 0.5mg  wasted . Witnessed by Norva Riffle RN

## 2013-09-09 NOTE — Anesthesia Preprocedure Evaluation (Addendum)
Anesthesia Evaluation  Patient identified by MRN, date of birth, ID band Patient awake    Reviewed: Allergy & Precautions, H&P , NPO status , Patient's Chart, lab work & pertinent test results  Airway Mallampati: II TM Distance: >3 FB Neck ROM: full    Dental  (+) Edentulous Upper and Dental Advisory Given   Pulmonary Current Smoker,          Cardiovascular hypertension,  S/p heart transplant. Recent TTE shows normal function.   Neuro/Psych    GI/Hepatic   Endo/Other    Renal/GU Renal InsufficiencyRenal disease     Musculoskeletal   Abdominal   Peds  Hematology   Anesthesia Other Findings   Reproductive/Obstetrics                          Anesthesia Physical Anesthesia Plan  ASA: III  Anesthesia Plan: General   Post-op Pain Management:    Induction: Intravenous  Airway Management Planned: Oral ETT  Additional Equipment:   Intra-op Plan:   Post-operative Plan: Extubation in OR  Informed Consent: I have reviewed the patients History and Physical, chart, labs and discussed the procedure including the risks, benefits and alternatives for the proposed anesthesia with the patient or authorized representative who has indicated his/her understanding and acceptance.   Dental advisory given  Plan Discussed with: CRNA, Anesthesiologist and Surgeon  Anesthesia Plan Comments:        Anesthesia Quick Evaluation

## 2013-09-09 NOTE — Transfer of Care (Signed)
Immediate Anesthesia Transfer of Care Note  Patient: Peter Clarke  Procedure(s) Performed: Procedure(s): Lap. Assisted Colectomy, lysis of adhesions, Colostomy (N/A)  Patient Location: PACU  Anesthesia Type:General  Level of Consciousness: awake, alert  and oriented  Airway & Oxygen Therapy: Patient Spontanous Breathing and Patient connected to nasal cannula oxygen  Post-op Assessment: Report given to PACU RN, Post -op Vital signs reviewed and stable and Patient moving all extremities  Post vital signs: Reviewed and stable  Complications: No apparent anesthesia complications

## 2013-09-09 NOTE — Consult Note (Addendum)
WOC ostomy consult  Stoma type/location:  Consult for pre-surgical marking for Colostomy.  Peristomal assessment:  Hernia present as well as skin folds, creating need for higher placement.  Loraine Leriche is placed 5 cm left of umbilicus and 3 cm above.  Loraine Leriche is within the line of vision, within the rectus muscle.  Patient was assessed sitting, standing and lying.   Education provided: Wife present at bedside, ostomy basics booklet given and overview of stoma placement and post-surgical pouching. Will see patient again post operatively and reinforce teaching.  Maple Hudson RN BSN CWON Pager [phone]

## 2013-09-10 DIAGNOSIS — K5732 Diverticulitis of large intestine without perforation or abscess without bleeding: Principal | ICD-10-CM

## 2013-09-10 LAB — BASIC METABOLIC PANEL
BUN: 34 mg/dL — ABNORMAL HIGH (ref 6–23)
CO2: 18 mEq/L — ABNORMAL LOW (ref 19–32)
Calcium: 7.6 mg/dL — ABNORMAL LOW (ref 8.4–10.5)
Chloride: 108 mEq/L (ref 96–112)
Creatinine, Ser: 1.37 mg/dL — ABNORMAL HIGH (ref 0.50–1.35)
GFR calc Af Amer: 60 mL/min — ABNORMAL LOW (ref >90)

## 2013-09-10 LAB — CBC
HCT: 28.4 % — ABNORMAL LOW (ref 39.0–52.0)
Hemoglobin: 10.2 g/dL — ABNORMAL LOW (ref 13.0–17.0)
MCH: 34.8 pg — ABNORMAL HIGH (ref 26.0–34.0)
MCV: 96.9 fL (ref 78.0–100.0)
RDW: 13.2 % (ref 11.5–15.5)
WBC: 9.5 10*3/uL (ref 4.0–10.5)

## 2013-09-10 LAB — TACROLIMUS LEVEL: Tacrolimus (FK506) - LabCorp: 6 ng/mL

## 2013-09-10 MED ORDER — POTASSIUM CHLORIDE 2 MEQ/ML IV SOLN
INTRAVENOUS | Status: DC
  Administered 2013-09-10: 11:00:00 via INTRAVENOUS
  Filled 2013-09-10 (×2): qty 1000

## 2013-09-10 MED ORDER — KCL IN DEXTROSE-NACL 20-5-0.45 MEQ/L-%-% IV SOLN
INTRAVENOUS | Status: DC
  Administered 2013-09-11 – 2013-09-16 (×11): via INTRAVENOUS
  Filled 2013-09-10 (×17): qty 1000

## 2013-09-10 MED ORDER — WHITE PETROLATUM GEL
Status: AC
  Administered 2013-09-10: 16:00:00
  Filled 2013-09-10: qty 5

## 2013-09-10 NOTE — Consult Note (Signed)
WOC ostomy consult  Stoma type/location: LLQ Colostomy  Stomal assessment/size:  4 cm x 4 cm, pink and moist.   Peristomal assessment: Pouch in place. Treatment options for stomal/peristomal skin: None at this time.  Output Clear, thin ouput Ostomy pouching: 2pc.  Education provided: Patient very groggy, stated that pain is controlled at this time.  Informed that WOC team would be back around for teaching at a later time. Pouch intact at this time and no needs identified.   Maple Hudson RN BSN CWON Pager [phone]

## 2013-09-10 NOTE — Progress Notes (Signed)
Patient received at 1852 from the PACU. Came up to the unit on 4L [place], within an hour and half was escalated to a non-rebreather, SpO2 maintaining in the high 80s- 90s. Breathing shallowly. Patient responsive to painful stimuli, would not follow commands or answer questions. April RN, with rapid response notified. Patient received narcan. Immediately became responsive, SpO2 97%. Fine crackles appreciated bilaterally in lower lung bases. Maren Reamer notified and updated on events. CXR ordered. NP to bedside to assess patient. New orders received. Will continue to monitor patient.   Rochele Pages, RN

## 2013-09-10 NOTE — Progress Notes (Signed)
[facility] Progress Note [place] TEAM 1 - Stepdown ICU Team   Peter Clarke VHQ:[id number] DOB: [birth year] DOA: 09/07/2013 PCP: [name], MD  Brief narrative: 67 year old male with remote history of cardiac transplant at [name] 11 years ago on chronic immunosuppressive therapy. 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 He also has underlying chronic kidney disease stage III. He presented to [facility] with abdominal pain associated with diarrhea but no melena or hematochezia and no nausea/vomiting. He initially presented to [facility] where CT of the abdomen and pelvis revealed abnormal thickening of the sigmoid colon with perforation. Given his underlying medical history she was subsequently transferred to [facility] for further treatment. After arrival here he has been evaluated by Dr. [name] with surgery who recommended conservative treatment with bowel rest and IV antibiotics. In review of his laboratory values he had a leukocytosis with white count of 13,000 and creatinine had increased to 2.96 from 1.57.  Assessment/Plan:  Diverticulitis with perforation -care as per Gen Surgery - now s/p laparoscopic assisted Hartman's procedure (sigmoid colectomy with end colostomy) w/ lysis of adhesions x 1 hr  History of heart transplant/chronic immunosuppression/history of Chronic Systolic heart failure/New grade 2 DD -care as per CHF Team  -ECHO this admit showed grade 2 DD therefore watch IVF post op  Head and neck cancer -tx'd at [place] -Imuran dose decreased to 100 mg Daily in August when dx'd with throat cancer. -Dr. [name] also d/w pt's transplant Cardiologist at [facility] who recommended switching Prograf to 1 mg BID SL post op for better absorption -continue home steroid dose  HTN (hypertension) -BP well controlled at this time   CKD (chronic kidney disease) stage 3, GFR 30-59  ml/min -renal function is steadily improving   DVT prophylaxis: Subcutaneous heparin Code Status: Full Family Communication: Patient and family at bedside Disposition Plan/Expected LOS: Stepdown  Consultants: Cardiology General surgery  Procedures: 2-D echocardiogram - Left ventricle: The cavity size was normal. Wall thickness was normal. Systolic function was normal. The estimated ejection fraction was in the range of 60% to 65%. Wall motion was normal; there were no regional wall motion abnormalities. Features are consistent with a pseudonormal left ventricular filling pattern, with concomitant abnormal relaxation and increased filling pressure (grade 2 diastolic dysfunction). - Aortic valve: There was no stenosis. - Mitral valve: Trivial regurgitation. - Left atrium: The atrium was mildly dilated. - Right ventricle: Poorly visualized. The cavity size was normal. Systolic function was normal. - Right atrium: The atrium was mildly dilated. - Pulmonary arteries: No complete TR doppler jet so unable to estimate PA systolic pressure. - Systemic veins: IVC was not visualized.   Antibiotics: Ertapenem 9/21 >>>  Flagyl 9/21  Zosyn 9/21   HPI/Subjective: C/o pain at surgical site.  Denies cp, sob, f/c, n/v, or ha.    Objective: Blood pressure 139/80, pulse 102, temperature 100.1 F (37.8 C), temperature source Oral, resp. rate 28, height 6' (1.829 m), weight 79.6 kg (175 lb 7.8 oz), SpO2 97.00%.  Intake/Output Summary (Last 24 hours) at 09/10/13 1544 Last data filed at 09/10/13 1500  Gross per 24 hour  Intake   1635 ml  Output    975 ml  Net    660 ml   Exam: General: No acute respiratory distress Lungs: Clear to auscultation bilaterally without wheezes or crackles Cardiovascular: Regular rate and rhythm without murmur gallop or rub, no peripheral edema or JVD Abdomen:  modestly tender lower abdomen, nondistended, soft, hypoactive bowel sounds positive, no rebound, no  ascites, no appreciable mass Musculoskeletal: No significant cyanosis, clubbing of bilateral lower extremities  Scheduled Meds:  Scheduled Meds: . azaTHIOprine  100 mg Oral Daily  . ertapenem  1 g Intravenous Q24H  . heparin  5,000 Units Subcutaneous Q8H  . HYDROmorphone PCA 0.3 mg/mL   Intravenous Q4H  . metoprolol  5 mg Intravenous Q6H  . predniSONE  5 mg Oral Daily  . sodium chloride  3 mL Intravenous Q12H  . tacrolimus  1 mg Sublingual BID  . white petrolatum       Data Reviewed: Basic Metabolic Panel:  Recent Labs Lab 09/07/13 1142 09/08/13 0340 09/09/13 0520 09/10/13 0555  NA 130* 135 134* 139  K 4.1 5.2* 4.2 4.1  CL 92* 102 101 108  CO2 27 24 21  18*  GLUCOSE 102* 97 92 82  BUN 37* 36* 35* 34*  CREATININE 2.06* 1.84* 1.56* 1.37*  CALCIUM 9.2 8.4 8.3* 7.6*   Liver Function Tests:  Recent Labs Lab 09/07/13 1142 09/08/13 0340  AST 14 13  ALT 11 9  ALKPHOS 54 43  BILITOT 0.7 0.5  PROT 6.5 5.3*  ALBUMIN 3.3* 2.6*   CBC:  Recent Labs Lab 09/07/13 1142 09/08/13 0340 09/09/13 0520 09/10/13 0555  WBC 13.2* 13.0* 9.6 9.5  NEUTROABS 12.0*  --   --   --   HGB 11.5* 9.8* 10.2* 10.2*  HCT 33.9* 28.6* 29.6* 28.4*  MCV 99.7 98.6 97.4 96.9  PLT 240 207 194 204    Recent Results (from the past 240 hour(s))  CULTURE, BLOOD (ROUTINE X 2)     Status: None   Collection Time    09/07/13 11:41 AM      Result Value Range Status   Specimen Description BLOOD LEFT ANTECUBITAL   Final   Special Requests BOTTLES DRAWN AEROBIC AND ANAEROBIC 8CC   Final   Culture NO GROWTH 3 DAYS   Final   Report Status PENDING   Incomplete  CULTURE, BLOOD (ROUTINE X 2)     Status: None   Collection Time    09/07/13 11:43 AM      Result Value Range Status   Specimen Description BLOOD RIGHT ANTECUBITAL   Final   Special Requests BOTTLES DRAWN AEROBIC AND ANAEROBIC 12CC   Final   Culture NO GROWTH 3 DAYS   Final   Report Status PENDING   Incomplete  MRSA PCR SCREENING     Status:  None   Collection Time    09/07/13  7:23 PM      Result Value Range Status   MRSA by PCR NEGATIVE  NEGATIVE Final   Comment:            The GeneXpert MRSA Assay (FDA     approved for NASAL specimens     only), is one component of a     comprehensive MRSA colonization     surveillance program. It is not     intended to diagnose MRSA     infection nor to guide or     monitor treatment for     MRSA infections.  URINE CULTURE     Status: None   Collection Time    09/08/13 10:11 AM      Result Value Range Status   Specimen Description URINE, CLEAN CATCH   Final   Special Requests NONE   Final   Culture  Setup Time  Final   Value: 09/08/2013 13:29     Performed at [facility] Count     Final   Value: NO GROWTH     Performed at [facility]   Culture     Final   Value: NO GROWTH     Performed at [facility]   Report Status 09/09/2013 FINAL   Final     Studies:  Recent x-ray studies have been reviewed in detail by the Attending Physician  Lonia Blood, MD [facility] Office  [phone] Pager [phone]  On-Call/Text Page:      [email or web address]      password [email or web address]  If 7PM-7AM, please contact night-coverage [email or web address] Password [facility] 09/10/2013, 3:44 PM   LOS: 3 days

## 2013-09-10 NOTE — Progress Notes (Signed)
1 Day Post-Op  Subjective: C/o soreness. No n/v. No flatus in bag.   Objective: Vital signs in last 24 hours: Temp:  [98.7 F (37.1 C)-100.5 F (38.1 C)] 100.1 F (37.8 C) (09/24 0700) Pulse Rate:  [93-104] 104 (09/24 0618) Resp:  [17-31] 24 (09/24 0800) BP: (105-139)/(58-79) 128/79 mmHg (09/24 0618) SpO2:  [82 %-100 %] 94 % (09/24 0800) FiO2 (%):  [50 %] 50 % (09/23 1918) Last BM Date: 09/08/13  Intake/Output from previous day: 09/23 0701 - 09/24 0700 In: 4415 [I.V.:3865; IV Piggyback:550] Out: 1530 [Urine:1430; Blood:100] Intake/Output this shift:    Awake, a little groggy, but ox3 cta b/l  Reg Soft, nd. approp TTP. Midline wound ok. Ostomy pink/viable. No air in bag  Lab Results:   Recent Labs  09/09/13 0520 09/10/13 0555  WBC 9.6 9.5  HGB 10.2* 10.2*  HCT 29.6* 28.4*  PLT 194 204   BMET  Recent Labs  09/09/13 0520 09/10/13 0555  NA 134* 139  K 4.2 4.1  CL 101 108  CO2 21 18*  GLUCOSE 92 82  BUN 35* 34*  CREATININE 1.56* 1.37*  CALCIUM 8.3* 7.6*   PT/INR  Recent Labs  09/08/13 0340 09/09/13 0520  LABPROT 19.0* 18.9*  INR 1.64* 1.63*   ABG No results found for this basename: PHART, PCO2, PO2, HCO3,  in the last 72 hours  Studies/Results: Dg Chest Port 1 View  09/09/2013   CLINICAL DATA:  Respiratory distress. History of head and neck cancer.  EXAM: PORTABLE CHEST - 1 VIEW  COMPARISON:  Single view of the chest 08/20/2013.  FINDINGS: The patient has bilateral effusions and diffuse airspace disease. No pneumothorax is identified.  IMPRESSION: Diffuse bilateral airspace disease and pleural effusion most consistent with congestive heart failure. Coexistent pneumonia is also possible.   Electronically Signed   By: Drusilla Kanner M.D.   On: 09/09/2013 22:01    Anti-infectives: Anti-infectives   Start     Dose/Rate Route Frequency Ordered Stop   09/07/13 2000  ertapenem (INVANZ) 1 g in sodium chloride 0.9 % 50 mL IVPB     1 g 100 mL/hr over 30  Minutes Intravenous Every 24 hours 09/07/13 1914     09/07/13 1430  piperacillin-tazobactam (ZOSYN) IVPB 3.375 g     3.375 g 12.5 mL/hr over 240 Minutes Intravenous  Once 09/07/13 1425 09/07/13 1516   09/07/13 1430  metroNIDAZOLE (FLAGYL) IVPB 500 mg     500 mg 100 mL/hr over 60 Minutes Intravenous  Once 09/07/13 1425 09/07/13 1617      Assessment/Plan: s/p Procedure(s): Lap. Assisted Colectomy, lysis of adhesions, Colostomy (N/A)  Ice chips, sips of water  pulm toilet - IS, flutter valve, OOB to chair Cont IV abx given perforation Immunosuppression per cards Agree with increasing IVF - discussed with cards Cont foley today due to low pelvic surgery Cont VTE prophylaxis  [name]. [name], MD, FACS General, Bariatric, & Minimally Invasive Surgery [facility], [place]   LOS: 3 days    Peter Clarke 09/10/2013

## 2013-09-10 NOTE — Progress Notes (Signed)
. Advanced Heart Failure Team Consult Note  Referring Physician: Dr. [name] ([facility]) Primary Cardiologist:  Dr. 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  S/p partial colectomy yesterday with lysis of adhesions. Sats dropped overnight and required NRB. Now on 4L. + ab pain. Lethargic due to pain meds. No dyspnea.   Objective:    Vital Signs:   Temp:  [98.7 F (37.1 C)-100.5 F (38.1 C)] 100.1 F (37.8 C) (09/24 0700) Pulse Rate:  [93-104] 104 (09/24 0618) Resp:  [17-31] 24 (09/24 0800) BP: (105-139)/(58-79) 128/79 mmHg (09/24 0618) SpO2:  [82 %-100 %] 94 % (09/24 0800) FiO2 (%):  [50 %] 50 % (09/23 1918) Last BM Date: 09/08/13  Weight change: Filed Weights   09/07/13 1845  Weight: 79.6 kg (175 lb 7.8 oz)    Intake/Output:   Intake/Output Summary (Last 24 hours) at 09/10/13 0948 Last data filed at 09/10/13 0700  Gross per 24 hour  Intake   4415 ml  Output   1530 ml  Net   2885 ml     Physical Exam: General:  Somnolent moaning with pain.  HEENT: normal Neck: supple. JVP 8 . Carotids 2+ bilat; no bruits. No lymphadenopathy or thryomegaly appreciated. Cor: PMI nondisplaced. Regular rate & rhythm. No rubs, gallops or murmurs. Lungs: clear Abdomen: soft, tender to palpation. + colostomy. Hypoactive BS Extremities: no cyanosis, clubbing, rash, edema Neuro: alert & orientedx3, cranial nerves grossly intact. moves all 4 extremities w/o difficulty. Affect pleasant  Telemetry: SR 95-105  Labs: Basic Metabolic Panel:  Recent Labs Lab 09/07/13 1142 09/08/13 0340 09/09/13 0520 09/10/13 0555  NA 130* 135 134* 139  K 4.1  5.2* 4.2 4.1  CL 92* 102 101 108  CO2 27 24 21  18*  GLUCOSE 102* 97 92 82  BUN 37* 36* 35* 34*  CREATININE 2.06* 1.84* 1.56* 1.37*  CALCIUM 9.2 8.4 8.3* 7.6*    Liver Function Tests:  Recent Labs Lab 09/07/13 1142 09/08/13 0340  AST 14 13  ALT 11 9  ALKPHOS 54 43  BILITOT 0.7 0.5  PROT 6.5 5.3*  ALBUMIN 3.3* 2.6*   No results found for this basename: LIPASE, AMYLASE,  in the last 168 hours No results found for this basename: AMMONIA,  in the last 168 hours  CBC:  Recent Labs Lab 09/07/13 1142 09/08/13 0340 09/09/13 0520 09/10/13 0555  WBC 13.2* 13.0* 9.6 9.5  NEUTROABS 12.0*  --   --   --   HGB 11.5* 9.8* 10.2* 10.2*  HCT 33.9* 28.6* 29.6* 28.4*  MCV 99.7 98.6 97.4 96.9  PLT 240 207 194 204    Cardiac Enzymes: No results found for this basename: CKTOTAL, CKMB, CKMBINDEX, TROPONINI,  in the last 168 hours  BNP: BNP (last 3 results) No results found for this basename: PROBNP,  in the last 8760 hours  CBG: No results found for this basename: GLUCAP,  in the last 168 hours  Coagulation Studies:  Recent Labs  09/08/13 0340 09/09/13 0520  LABPROT 19.0* 18.9*  INR 1.64* 1.63*      Imaging: Dg Chest Port 1 View  09/09/2013   CLINICAL DATA:  Respiratory distress. History of head and neck  cancer.  EXAM: PORTABLE CHEST - 1 VIEW  COMPARISON:  Single view of the chest 08/20/2013.  FINDINGS: The patient has bilateral effusions and diffuse airspace disease. No pneumothorax is identified.  IMPRESSION: Diffuse bilateral airspace disease and pleural effusion most consistent with congestive heart failure. Coexistent pneumonia is also possible.   Electronically Signed   By: Drusilla Kanner M.D.   On: 09/09/2013 22:01     Medications:     Current Medications: . azaTHIOprine  100 mg Oral Daily  . ertapenem  1 g Intravenous Q24H  . heparin  5,000 Units Subcutaneous Q8H  . HYDROmorphone PCA 0.3 mg/mL   Intravenous Q4H  . metoprolol  5 mg Intravenous Q6H  .  predniSONE  5 mg Oral Daily  . sodium chloride  3 mL Intravenous Q12H  . tacrolimus  1 mg Sublingual BID    Infusions: . sodium chloride 10 mL/hr at 09/09/13 2305     Assessment:   1. Acute diverticulitis with perforation s/p partial colectomy 2. Chronic systolic HF s/p OHTx in 2003 ([name]) 3. Tonsillar cancer - currently undergoing Chemo/XRT 4. Acute on chronic renal failure  Plan/Discussion:    Stable from cardiac standpoint. While NPO will increase IVF to 50/hr. Can give lasix as needed. Now on sublingual tacrolimus (until po intake improves). We will send trough level tomorrow but it is send out lab and will take 2 days to get back (we will try to expedite). Goal trough ~ 10. Can give stress dose steroids if needed.   Peter Mayorquin,MD 9:48 AM Length of Stay: 3 Advanced Heart Failure Team Pager [phone] (M-F; 7a - 4p)  Please contact [facility] for night-coverage after hours (4p -7a ) and weekends on [email or web address]

## 2013-09-10 NOTE — Op Note (Signed)
Peter Clarke, Peter Clarke NO.:  [id number]  MEDICAL RECORD NO.:  [id number]  LOCATION:  3S13C                        FACILITY:  [facility]  PHYSICIAN:  [name]. [name], MD, FACSDATE OF BIRTH:  1946-11-22  DATE OF PROCEDURE:  09/09/2013 DATE OF DISCHARGE:                              OPERATIVE REPORT   PREOPERATIVE DIAGNOSIS:  Perforated sigmoid diverticulitis.  POSTOPERATIVE DIAGNOSIS:  Perforated sigmoid diverticulitis.  PROCEDURE: 1. Laparoscopic assisted Hartmann's procedure. 2. Lysis of adhesions x1 hour.  SURGEON:  [name]. [name], MD, FACS.  ASSISTANT:  Ashok Norris, nurse practitioner.  ANESTHESIA:  General.  EBL:  Less than 100.  SPECIMEN:  Sigmoid colon stitch marks of proximal margin.  FINDINGS:  The patient had a very redundant sigmoid colon, it actually went up into the upper abdomen and was then curled back down into the pelvis.  The site of perforation was in the mid sigmoid colon which was actually plastered to his small bowel mesentery in the upper midline.  The rectal stump was left long and it was tacked with two 2-0 Prolene.  Then Seprafilm was placed in the abdomen.  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  He underwent a CT scan, which demonstrated sigmoid diverticulitis with a small microperforation. He was afebrile and not tachycardic at that time.  He also had a mild leukocytosis.  Medical management was instituted with bowel rest and IV antibiotics.  The admitting doctor had a very long conversation with the patient and family that he was high risk for failure of medical management because of his underlying immunosuppression and current chemotherapy.  He  actually looked a little bit better on hospital day 1. However, this morning even though his white count had gone down, and he reported more pain and he was more tender on exam.  Based on his worsening clinical exam, I recommended exploratory laparotomy with resection of the sigmoid colon with creation of an end colostomy as the safest course of action in order to get him back to full recovery as quickly as possible.  We discussed the risks and benefits of surgery including, but not limited to, bleeding, infection, injury to surrounding structures, need to convert to an open procedure, injury to the ureters, incisional hernia, wound complications, parastomal hernia, need for additional procedures, abscess formation, blood blot formation, rejection of his heart transplant, ileus and prolonged hospitalization. He elected to proceed to surgery.  Preoperatively, I had the patient marked by the stoma nurse.  DESCRIPTION OF PROCEDURE:  After obtaining informed consent, the patient was taken to the operating room 1 in the [facility] and placed supine on the operating table.  General endotracheal anesthesia was established.  A Foley catheter was placed.  His abdomen was prepped and draped in usual standard surgical fashion with ChloraPrep.  He was on therapeutic antibiotics within minutes.  A surgical time-out was performed.  I started the case laparoscopically.  A  small incision was made just infraumbilical with a #11 blade.  The fascia was grasped with a towel clamp and lifted anteriorly.  Next, the Veress needle was advanced through all layers of the abdominal wall into the abdominal cavity, with the saline drop test, which easily dropped in the abdominal cavity.  Pneumoperitoneum was smoothly established up to a patient pressure of 15 mmHg.  The Veress needle was removed and a 5-mm Laparoscope thru a 5mm trocar was advanced through all layers under direct visualization and  smoothly entered the abdominal cavity.  There was evidence of omental adhesions in the upper abdomen.  I was able to navigate around this and find a window in the right upper quadrant and a 5-mm trocar was placed there and in the right lower quadrant another 5-mm trocar was placed all under direct visualization.  The camera was placed in the right upper quadrant laterally and then I surveilled the abdomen.  He had a fair amount of omental adhesions to the anterior midline on the left side of the abdomen.  These were taken down with a combination of Endo Shears as well as with Harmonic scalpel.  Once I had taken down the omentum from the abdominal wall, I tried to pull the omentum up out of the pelvis.  The tail end of the omentum was essentially plastered down into his pelvis.  I started taking down some of it laparoscopically.  I then identified the sigmoid colon going into the pelvis.  I tried to bring it up out of the pelvis and there was a little bit of mobility, however, there was a loop of small bowel coming off.  It has been going down into the pelvis which was adhered and affixed to his right pelvic sidewall.  It also appeared to be attached to the descending sigmoid colon of  the upper rectum.  I was unable to bring the small bowel up out of the pelvis.  Moreover, I identified the descending colon, it appeared to come down to the sacral promontory, then curve back up underneath the small bowel and then come back down and go into the pelvis.  Because of how the small bowel was adhered to the right pelvic sidewall at this point, I elected to switch to an open procedure.  A lower midline incision was made with a #10 blade from the trocar site all the way down to the pubic bone.  The subcu tissue was divided with electrocautery and the fascia and peritoneum were opened and the abdominal cavity was entered.  Initially, a Balfour retractor was placed.  I then continued lysing adhesions  and mobilizing the omentum up out of the pelvis and was able to free it from the peritoneum in the pelvis.  It then became quite evident that he had a loop of distal ileum that was adhered to the right pelvis as well as to the upper rectal mesorectum.  I continued to lyse these adhesions and it took about an hour in order to free the small bowel from the pelvic sidewall.  The right ureter was identified and preserved.  Once I was able to lift the small bowel and omentum up out of the pelvis, I had already switched to Bookwalter retractor system.  It became obvious that the descending colon came down the left pericolic gutter, then turned back up and went underneath some small bowel mesentery because I could feel a hard inflammatory rind in the upper midline and then the colon came  back down and went into the pelvis.  It did take another hour to mobilize the sigmoid colon since it was stuck to the sacral promontory.  I ended up transecting the sigmoid colon proximally after I made a small window in the mesocolon just next to the bowel lumen and divided it with the GIA 75 stapler with a blue load.  I finger fractured and got into the abscess cavity.  There was frank spillage of some stool, this was a clear site of perforation.  I was able to bluntly lift  the colon up off of the small bowel mesentery at this point.  Then using a right angle, I continued to mobilize the colon away from the small bowel mesentery in the pelvic inlet.  This was done with the aid of a right-angle Metzenbaum scissors as well as Harmonic scalpel.  I then found an area of the distal sigmoid colon that was nice and soft and not indurated.  I made a window in the mesentery just next to the colonic wall and divided the colon with another fire of the GIA stapler with a 75 mm blue load.  I identified the left ureter.  It was well away from the area of concern.  I then took down the colonic mesentery in a serial fashion  using the LigaSure device.  I came across a small vascular pedicle going into this section of the sigmoid colon.  It was tied off proximally with a 2-0 silk tie and ligated distally with a LigaSure device.  The specimen was eventually freed.  I marked the proximal margin with a silk stitch.  I then reinspected the distal small bowel that had been lysed from the right pelvic sidewall.  There was no evidence of any enterotomy.  The serosa was a little bit bruised.  I placed two 3-0 silk sutures in a Lembert fashion.  I reinspected the abscess cavity and made sure there was no sign of fistulization to an adjacent loop of small bowel or transverse colon and there appeared to be nothing of that sort.  He has over a long rectal stump slightly extending above the sacral promontory.  It was packed with two 2-0 Prolene sutures.  The lateral tack was placed to the left side of the peritoneum slightly above the gonadal vessels.  The patient also had a direct inguinal hernia on the left side which was left alone.  At this point, I took out some of the epiploic appendages off the proximal end of the colon with the LigaSure device.  The abdomen was copiously irrigated with numerous L of saline.  The correct ostomy site that had been marked preoperatively was very high up in his left upper quadrant very close to the subcostal margin, and I decided not to use that site. A circular skin incision was made about 2.5 inches from the midline incision on the left slightly below the level of the umbilicus.  The subcutaneous tissue was divided.  A cruciate incision was made in the fascia.  The rectus muscle was spread with a Kelly, and the peritoneum was incised.  I was able to accommodate 2 fingers.  I then returned the omentum to the abdomen.  The Bookwalter retractor system had already been removed.  A Seprafilm was then placed in the abdomen.  I then closed the midline with a running looped #1 PDS from above  and 1 from below.  The skin was left open.  The 2 trocar sites on  the right side were stapled with a skin stapler.  I then transected the staple line with Bovie electrocautery.  I then placed four 3-0 Vicryl sutures at the superior aspect, inferior aspect, medial and lateral aspect of the ostomy in a typical fashion.  I then placed two 3-0 Vicryl sutures in each quadrant.  The colostomy was nicely everted.  There were no signs of ischemia to the ostomy.  An ostomy appliance was then affixed to the abdominal wall.  Moist 4x4s were left in the midline wound and covered with dry gauze and tape.  The patient was extubated and taken to the recovery room in stable condition.  There were no immediate complications.  The patient tolerated the procedure well.     [name]. [name], MD, FACS     EMW/MEDQ  D:  09/09/2013  T:  09/10/2013  Job:  [id number]

## 2013-09-11 ENCOUNTER — Encounter (HOSPITAL_COMMUNITY): Payer: Self-pay | Admitting: General Surgery

## 2013-09-11 DIAGNOSIS — R5381 Other malaise: Secondary | ICD-10-CM

## 2013-09-11 LAB — BASIC METABOLIC PANEL
BUN: 38 mg/dL — ABNORMAL HIGH (ref 6–23)
Chloride: 104 mEq/L (ref 96–112)
Creatinine, Ser: 1.32 mg/dL (ref 0.50–1.35)
GFR calc Af Amer: 63 mL/min — ABNORMAL LOW (ref >90)
Glucose, Bld: 116 mg/dL — ABNORMAL HIGH (ref 70–99)
Potassium: 3.9 mEq/L (ref 3.5–5.1)

## 2013-09-11 LAB — CBC
HCT: 29 % — ABNORMAL LOW (ref 39.0–52.0)
Hemoglobin: 10 g/dL — ABNORMAL LOW (ref 13.0–17.0)
MCH: 33.4 pg (ref 26.0–34.0)
MCHC: 34.5 g/dL (ref 30.0–36.0)
MCV: 97 fL (ref 78.0–100.0)
Platelets: 175 10*3/uL (ref 150–400)
WBC: 10.4 10*3/uL (ref 4.0–10.5)

## 2013-09-11 MED ORDER — SODIUM CHLORIDE 0.9 % IJ SOLN: 9.0000 mL | INTRAMUSCULAR | Status: DC | PRN

## 2013-09-11 MED ORDER — DIPHENHYDRAMINE HCL 50 MG/ML IJ SOLN: 12.5000 mg | Freq: Four times a day (QID) | INTRAMUSCULAR | Status: DC | PRN

## 2013-09-11 MED ORDER — ONDANSETRON HCL 4 MG/2ML IJ SOLN: 4.0000 mg | Freq: Four times a day (QID) | INTRAMUSCULAR | Status: DC | PRN

## 2013-09-11 MED ORDER — DIPHENHYDRAMINE HCL 12.5 MG/5ML PO ELIX
12.5000 mg | ORAL_SOLUTION | Freq: Four times a day (QID) | ORAL | Status: DC | PRN
  Filled 2013-09-11: qty 5

## 2013-09-11 MED ORDER — NALOXONE HCL 0.4 MG/ML IJ SOLN: 0.4000 mg | INTRAMUSCULAR | Status: DC | PRN

## 2013-09-11 MED ORDER — HYDROMORPHONE 0.3 MG/ML IV SOLN
INTRAVENOUS | Status: DC
  Administered 2013-09-11: 1.02 mg via INTRAVENOUS
  Administered 2013-09-11: 1.2 mg via INTRAVENOUS
  Administered 2013-09-12: 0.6 mg via INTRAVENOUS
  Administered 2013-09-12: 1.8 mg via INTRAVENOUS
  Filled 2013-09-11: qty 25

## 2013-09-11 MED ORDER — MAGNESIUM SULFATE 40 MG/ML IJ SOLN
2.0000 g | Freq: Once | INTRAMUSCULAR | Status: AC
  Administered 2013-09-11: 2 g via INTRAVENOUS
  Filled 2013-09-11: qty 50

## 2013-09-11 NOTE — Progress Notes (Signed)
. Advanced Heart Failure Team Consult Note  Referring Physician: Dr. [name] ([facility]) Primary Cardiologist:  Dr. 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  S/p partial colectomy on 9/23 with lysis of adhesions. Still with some pain but feeling better.  Manitaining SR. No HF  Objective:    Vital Signs:   Temp:  [97.6 F (36.4 C)-98.5 F (36.9 C)] 97.7 F (36.5 C) (09/25 1140) Pulse Rate:  [94-103] 94 (09/25 0743) Resp:  [17-30] 17 (09/25 0743) BP: (126-145)/(72-84) 128/72 mmHg (09/25 0743) SpO2:  [94 %-97 %] 95 % (09/25 0743) Weight:  [82.8 kg (182 lb 8.7 oz)] 82.8 kg (182 lb 8.7 oz) (09/25 0400) Last BM Date: 09/08/13  Weight change: Filed Weights   09/07/13 1845 09/11/13 0400  Weight: 79.6 kg (175 lb 7.8 oz) 82.8 kg (182 lb 8.7 oz)    Intake/Output:   Intake/Output Summary (Last 24 hours) at 09/11/13 1213 Last data filed at 09/11/13 1140  Gross per 24 hour  Intake 1096.16 ml  Output   1325 ml  Net -228.84 ml     Physical Exam: General:  Sitting in chair.  HEENT: normal Neck: supple. JVP 6 . Carotids 2+ bilat; no bruits. No lymphadenopathy or thryomegaly appreciated. Cor: PMI nondisplaced. Regular rate & rhythm. No rubs, gallops or murmurs. Lungs: clear Abdomen: soft, tender to palpation. + colostomy. Hypoactive BS Extremities: no cyanosis, clubbing, rash, edema Neuro: alert & orientedx3, cranial nerves grossly intact. moves all 4 extremities w/o difficulty. Affect pleasant  Telemetry: SR 95  Labs: Basic Metabolic Panel:  Recent Labs Lab 09/07/13 1142 09/08/13 0340 09/09/13 0520 09/10/13 0555 09/11/13 0634   NA 130* 135 134* 139 136  K 4.1 5.2* 4.2 4.1 3.9  CL 92* 102 101 108 104  CO2 27 24 21  18* 24  GLUCOSE 102* 97 92 82 116*  BUN 37* 36* 35* 34* 38*  CREATININE 2.06* 1.84* 1.56* 1.37* 1.32  CALCIUM 9.2 8.4 8.3* 7.6* 8.1*  MG  --   --   --   --  1.0*    Liver Function Tests:  Recent Labs Lab 09/07/13 1142 09/08/13 0340  AST 14 13  ALT 11 9  ALKPHOS 54 43  BILITOT 0.7 0.5  PROT 6.5 5.3*  ALBUMIN 3.3* 2.6*   No results found for this basename: LIPASE, AMYLASE,  in the last 168 hours No results found for this basename: AMMONIA,  in the last 168 hours  CBC:  Recent Labs Lab 09/07/13 1142 09/08/13 0340 09/09/13 0520 09/10/13 0555 09/11/13 0634  WBC 13.2* 13.0* 9.6 9.5 10.4  NEUTROABS 12.0*  --   --   --   --   HGB 11.5* 9.8* 10.2* 10.2* 10.0*  HCT 33.9* 28.6* 29.6* 28.4* 29.0*  MCV 99.7 98.6 97.4 96.9 97.0  PLT 240 207 194 204 175    Cardiac Enzymes: No results found for this basename: CKTOTAL, CKMB, CKMBINDEX, TROPONINI,  in the last 168 hours  BNP: BNP (last 3 results) No results found for this basename: PROBNP,  in the last 8760 hours  CBG: No results found for this basename: GLUCAP,  in the last 168 hours  Coagulation Studies:  Recent Labs  09/09/13 0520  LABPROT 18.9*  INR 1.63*   Tacrolimus level: 6   Imaging: Dg Chest Port 1 View  09/09/2013   CLINICAL DATA:  Respiratory distress. History of head and neck cancer.  EXAM: PORTABLE CHEST - 1 VIEW  COMPARISON:  Single view of the chest 08/20/2013.  FINDINGS: The patient has bilateral effusions and diffuse airspace disease. No pneumothorax is identified.  IMPRESSION: Diffuse bilateral airspace disease and pleural effusion most consistent with congestive heart failure. Coexistent pneumonia is also possible.   Electronically Signed   By: Drusilla Kanner M.D.   On: 09/09/2013 22:01     Medications:     Current Medications: . azaTHIOprine  100 mg Oral Daily  . ertapenem  1 g Intravenous Q24H  .  heparin  5,000 Units Subcutaneous Q8H  . metoprolol  5 mg Intravenous Q6H  . predniSONE  5 mg Oral Daily  . sodium chloride  3 mL Intravenous Q12H  . tacrolimus  1 mg Sublingual BID    Infusions: . dextrose 5 % and 0.45 % NaCl with KCl 20 mEq/L 50 mL/hr at 09/11/13 0831     Assessment:   1. Acute diverticulitis with perforation s/p partial colectomy 2. Chronic systolic HF s/p OHTx in 2003 ([name]) 3. Tonsillar cancer - currently undergoing Chemo/XRT 4. Acute on chronic renal failure  Plan/Discussion:    Stable from cardiac standpoint. While NPO continue sublingual tacrolimus (until po intake improves). We sent trough level this am and should have back by this afternoon. Goal trough 5-10. Transplant team at [facility] updated by phone.  Daniel Bensimhon,MD 12:13 PM Length of Stay: 4 Advanced Heart Failure Team Pager [phone] (M-F; 7a - 4p)  Please contact [facility] for night-coverage after hours (4p -7a ) and weekends on [email or web address]

## 2013-09-11 NOTE — Progress Notes (Signed)
Phlebotomist came up to pt's room to draw blood for stat Bmet but pt refuses blood draw.
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
## 2013-09-11 NOTE — Progress Notes (Signed)
[facility] Progress Note [place] TEAM 1 - Stepdown ICU Team   Peter Clarke WUJ:[id number] DOB: [birth year] DOA: 09/07/2013 PCP: [name], MD  Brief narrative: 67 year old male with remote history of cardiac transplant at [name] 11 years ago on chronic immunosuppressive therapy. 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 He also has underlying chronic kidney disease stage III. He presented to [facility] with abdominal pain associated with diarrhea but no melena or hematochezia and no nausea/vomiting. He initially presented to [facility] where CT of the abdomen and pelvis revealed abnormal thickening of the sigmoid colon with perforation. Given his underlying medical history she was subsequently transferred to [facility] for further treatment. After arrival here he has been evaluated by Dr. [name] with surgery who recommended conservative treatment with bowel rest and IV antibiotics. In review of his laboratory values he had a leukocytosis with white count of 13,000 and creatinine had increased to 2.96 from 1.57.  Assessment/Plan:  Diverticulitis with perforation -care as per Gen Surgery - now s/p laparoscopic assisted Hartman's procedure (sigmoid colectomy with end colostomy) w/ lysis of adhesions x 1 hr-stable surgically-slow advance diet per [facility]-surgery has dc'd foley  History of heart transplant/chronic immunosuppression/history of Chronic Systolic heart failure/New grade 2 DD -care as per CHF Team  -ECHO this admit showed grade 2 DD therefore watch IVF post op -Cardiology has been periodically updating transplant surgeon at [facility] (last on 9/25)  Head and neck cancer -tx'd at [place] -Imuran dose decreased to 100 mg Daily in August when dx'd with throat cancer. -Dr. [name] also d/w pt's transplant Cardiologist at [facility] who recommended switching Prograf to 1 mg BID SL post op for  better absorption until can prove tolrates solid orals easily -continue home steroid dose   Hypomagnesemia -oral replete  HTN (hypertension) -BP well controlled at this time   CKD (chronic kidney disease) stage 3, GFR 30-59 ml/min -renal function is steadily improving   DVT prophylaxis: Subcutaneous heparin Code Status: Full Family Communication: Patient and family at bedside Disposition Plan/Expected LOS: Transfer to floor  Consultants: Cardiology General surgery  Procedures: 2-D echocardiogram - Left ventricle: The cavity size was normal. Wall thickness was normal. Systolic function was normal. The estimated ejection fraction was in the range of 60% to 65%. Wall motion was normal; there were no regional wall motion abnormalities. Features are consistent with a pseudonormal left ventricular filling pattern, with concomitant abnormal relaxation and increased filling pressure (grade 2 diastolic dysfunction). - Aortic valve: There was no stenosis. - Mitral valve: Trivial regurgitation. - Left atrium: The atrium was mildly dilated. - Right ventricle: Poorly visualized. The cavity size was normal. Systolic function was normal. - Right atrium: The atrium was mildly dilated. - Pulmonary arteries: No complete TR doppler jet so unable to estimate PA systolic pressure. - Systemic veins: IVC was not visualized.   Antibiotics: Ertapenem 9/21 >>>  Flagyl 9/21  Zosyn 9/21   HPI/Subjective: C/o expected pain at surgical site.  Seems to be tolerating clears so far.   Objective: Blood pressure 128/72, pulse 94, temperature 97.7 F (36.5 C), temperature source Oral, resp. rate 17, height 6' (1.829 m), weight 82.8 kg (182 lb 8.7 oz), SpO2 95.00%.  Intake/Output Summary (Last 24 hours) at 09/11/13 1319 Last data filed at 09/11/13 1140  Gross per 24 hour  Intake 996.16 ml  Output   1325 ml  Net -328.84 ml   Exam: General: No  acute respiratory distress Lungs: Clear to  auscultation bilaterally without wheezes or crackles Cardiovascular: Regular rate and rhythm without murmur gallop or rub, no peripheral edema or JVD Abdomen: modestly tender lower abdomen, nondistended, soft, hypoactive bowel sounds positive, no rebound, no ascites, no appreciable mass Musculoskeletal: No significant cyanosis, clubbing of bilateral lower extremities  Scheduled Meds:  Scheduled Meds: . azaTHIOprine  100 mg Oral Daily  . ertapenem  1 g Intravenous Q24H  . heparin  5,000 Units Subcutaneous Q8H  . metoprolol  5 mg Intravenous Q6H  . predniSONE  5 mg Oral Daily  . sodium chloride  3 mL Intravenous Q12H  . tacrolimus  1 mg Sublingual BID   Data Reviewed: Basic Metabolic Panel:  Recent Labs Lab 09/07/13 1142 09/08/13 0340 09/09/13 0520 09/10/13 0555 09/11/13 0634  NA 130* 135 134* 139 136  K 4.1 5.2* 4.2 4.1 3.9  CL 92* 102 101 108 104  CO2 27 24 21  18* 24  GLUCOSE 102* 97 92 82 116*  BUN 37* 36* 35* 34* 38*  CREATININE 2.06* 1.84* 1.56* 1.37* 1.32  CALCIUM 9.2 8.4 8.3* 7.6* 8.1*  MG  --   --   --   --  1.0*   Liver Function Tests:  Recent Labs Lab 09/07/13 1142 09/08/13 0340  AST 14 13  ALT 11 9  ALKPHOS 54 43  BILITOT 0.7 0.5  PROT 6.5 5.3*  ALBUMIN 3.3* 2.6*   CBC:  Recent Labs Lab 09/07/13 1142 09/08/13 0340 09/09/13 0520 09/10/13 0555 09/11/13 0634  WBC 13.2* 13.0* 9.6 9.5 10.4  NEUTROABS 12.0*  --   --   --   --   HGB 11.5* 9.8* 10.2* 10.2* 10.0*  HCT 33.9* 28.6* 29.6* 28.4* 29.0*  MCV 99.7 98.6 97.4 96.9 97.0  PLT 240 207 194 204 175    Recent Results (from the past 240 hour(s))  CULTURE, BLOOD (ROUTINE X 2)     Status: None   Collection Time    09/07/13 11:41 AM      Result Value Range Status   Specimen Description BLOOD LEFT ANTECUBITAL   Final   Special Requests BOTTLES DRAWN AEROBIC AND ANAEROBIC 8CC   Final   Culture NO GROWTH 4 DAYS   Final   Report Status PENDING   Incomplete  CULTURE, BLOOD (ROUTINE X 2)     Status:  None   Collection Time    09/07/13 11:43 AM      Result Value Range Status   Specimen Description BLOOD RIGHT ANTECUBITAL   Final   Special Requests BOTTLES DRAWN AEROBIC AND ANAEROBIC 12CC   Final   Culture NO GROWTH 4 DAYS   Final   Report Status PENDING   Incomplete  MRSA PCR SCREENING     Status: None   Collection Time    09/07/13  7:23 PM      Result Value Range Status   MRSA by PCR NEGATIVE  NEGATIVE Final   Comment:            The GeneXpert MRSA Assay (FDA     approved for NASAL specimens     only), is one component of a     comprehensive MRSA colonization     surveillance program. It is not     intended to diagnose MRSA     infection nor to guide or     monitor treatment for     MRSA infections.  URINE CULTURE     Status: None  Collection Time    09/08/13 10:11 AM      Result Value Range Status   Specimen Description URINE, CLEAN CATCH   Final   Special Requests NONE   Final   Culture  Setup Time     Final   Value: 09/08/2013 13:29     Performed at [facility] Count     Final   Value: NO GROWTH     Performed at [facility]   Culture     Final   Value: NO GROWTH     Performed at [facility]   Report Status 09/09/2013 FINAL   Final     Studies:  Recent x-ray studies have been reviewed in detail by the Attending Physician  Junious Silk, ANP [facility] Office  [phone] Pager [phone]  On-Call/Text Page:      [email or web address]      password [email or web address]  If 7PM-7AM, please contact night-coverage [email or web address] Password [email or web address] 09/11/2013, 1:19 PM   LOS: 4 days   I have examined the patient, reviewed the chart and modified the above note which I agree with.   Nyasiah Moffet,MD [phone] 09/11/2013, 5:57 PM

## 2013-09-11 NOTE — Progress Notes (Signed)
Report called to kim rn, pt transferring to 6N11 via bed with belongings, wife at bedside.

## 2013-09-11 NOTE — Progress Notes (Signed)
Patient ID: Peter Clarke, male   DOB: 07-Feb-1946, 67 y.o.   MRN: [id number] 2 Days Post-Op  Subjective: Pt feels ok.  No c/o.  No nausea.  Hasn't been out of bed yet.  Objective: Vital signs in last 24 hours: Temp:  [97.6 F (36.4 C)-100.1 F (37.8 C)] 98.3 F (36.8 C) (09/25 0743) Pulse Rate:  [94-106] 94 (09/25 0743) Resp:  [17-30] 17 (09/25 0743) BP: (105-145)/(62-84) 128/72 mmHg (09/25 0743) SpO2:  [90 %-97 %] 95 % (09/25 0743) Weight:  [182 lb 8.7 oz (82.8 kg)] 182 lb 8.7 oz (82.8 kg) (09/25 0400) Last BM Date: 09/08/13  Intake/Output from previous day: 09/24 0701 - 09/25 0700 In: 1237.3 [P.O.:150; I.V.:1087.3] Out: 1325 [Urine:1325] Intake/Output this shift: Total I/O In: 3 [I.V.:3] Out: 400 [Urine:400]  PE: Abd: soft, appropriately tender, few BS, ND, incisional wound is clean, ostomy is pink and viable.  No flatus yet.  Lab Results:   Recent Labs  09/10/13 0555 09/11/13 0634  WBC 9.5 10.4  HGB 10.2* 10.0*  HCT 28.4* 29.0*  PLT 204 175   BMET  Recent Labs  09/10/13 0555 09/11/13 0634  NA 139 136  K 4.1 3.9  CL 108 104  CO2 18* 24  GLUCOSE 82 116*  BUN 34* 38*  CREATININE 1.37* 1.32  CALCIUM 7.6* 8.1*   PT/INR  Recent Labs  09/09/13 0520  LABPROT 18.9*  INR 1.63*   CMP     Component Value Date/Time   NA 136 09/11/2013 0634   K 3.9 09/11/2013 0634   CL 104 09/11/2013 0634   CO2 24 09/11/2013 0634   GLUCOSE 116* 09/11/2013 0634   BUN 38* 09/11/2013 0634   CREATININE 1.32 09/11/2013 0634   CREATININE 1.99* 06/14/2013 0825   CALCIUM 8.1* 09/11/2013 0634   PROT 5.3* 09/08/2013 0340   ALBUMIN 2.6* 09/08/2013 0340   AST 13 09/08/2013 0340   ALT 9 09/08/2013 0340   ALKPHOS 43 09/08/2013 0340   BILITOT 0.5 09/08/2013 0340   GFRNONAA 54* 09/11/2013 0634   GFRAA 63* 09/11/2013 0634   Lipase  No results found for this basename: lipase       Studies/Results: Dg Chest Port 1 View  09/09/2013   CLINICAL DATA:  Respiratory distress. History of head  and neck cancer.  EXAM: PORTABLE CHEST - 1 VIEW  COMPARISON:  Single view of the chest 08/20/2013.  FINDINGS: The patient has bilateral effusions and diffuse airspace disease. No pneumothorax is identified.  IMPRESSION: Diffuse bilateral airspace disease and pleural effusion most consistent with congestive heart failure. Coexistent pneumonia is also possible.   Electronically Signed   By: Drusilla Kanner M.D.   On: 09/09/2013 22:01    Anti-infectives: Anti-infectives   Start     Dose/Rate Route Frequency Ordered Stop   09/07/13 2000  ertapenem (INVANZ) 1 g in sodium chloride 0.9 % 50 mL IVPB     1 g 100 mL/hr over 30 Minutes Intravenous Every 24 hours 09/07/13 1914     09/07/13 1430  piperacillin-tazobactam (ZOSYN) IVPB 3.375 g     3.375 g 12.5 mL/hr over 240 Minutes Intravenous  Once 09/07/13 1425 09/07/13 1516   09/07/13 1430  metroNIDAZOLE (FLAGYL) IVPB 500 mg     500 mg 100 mL/hr over 60 Minutes Intravenous  Once 09/07/13 1425 09/07/13 1617       Assessment/Plan  1. S/p Hartman's for diverticulitis Patient Active Problem List   Diagnosis Date Noted  . Chronic diastolic heart failure 09/08/2013  .  Pre-operative cardiovascular examination 09/08/2013  . Fever 09/08/2013  . Leukocytosis 09/08/2013  . CKD (chronic kidney disease) stage 3, GFR 30-59 ml/min 09/08/2013  . HTN (hypertension) 09/07/2013  . Diverticulitis with perforation 09/07/2013  . Head and neck cancer 07/26/2013  . History of heart transplant/chronic immunosupression 06/17/2013  deconditioning  Plan: 1. The patient seems to be doing well.  He is having a few BS and has no nausea or distention.  Despite no colostomy output yet, will let him try some clear liquids today. 2. PT eval and treatment for mobilization 3. Dc foley, making good UOP 4. Cont pulm toilet 5. Cont abx therapy.   LOS: 4 days    Skylan Gift E 09/11/2013, 8:25 AM Pager: [phone]

## 2013-09-11 NOTE — Progress Notes (Signed)
Doing ok. Pain ok. Some burping/belching  Alert, nad cta  Soft, mild distension, ostomy viable. No air in bag. Some stoma sweat.  Incision - open, no necrotic tissue  Hypomagnesia - replace with 2 grams mag sulfate Pt/ot oob Clear liquids - stressed to pt to go slow with po and stop with N/abd pain D/c foley  Peter Clarke. [name], MD, FACS General, Bariatric, & Minimally Invasive Surgery [facility], [place]

## 2013-09-11 NOTE — Progress Notes (Signed)
Patient trying to get out of bed; disoriented to place, time, situation and had previously been oriented x4 on admission to unit.  Dr. Craige Cotta notified in change, awaiting response.  Oncoming nurse, [place], RN notified of change and awaiting response.  Will continue to monitor.

## 2013-09-11 NOTE — Consult Note (Signed)
WOC ostomy consult  Stoma type/location: Colostomy, LLQ Stomal assessment/size:  Pink, moist.  Stoma measures 1 5/8 inch Peristomal assessment: Intact Treatment options for stomal/peristomal skin: n/a Output clear, thin liquid Ostomy pouching: .2pc.  Education provided: Wife at bedside.  Removed ostomy pouch and explained that, once output resumed, would most likely be soft, non-formed stool. Measured stoma (1 5/8 inch currently).  Explained to wife that stoma should be measured at each pouch change as the stoma may initially change size and/or shape.  Cut wafer to fit applied appliance.  No rings or convexity needed.  Well budded stoma.  The adhesive does fold into a small skin fold, but the wafer fits nicely.  Applied pouch and demonstrated pouch closure and the emptying process.  Colostomy education booklet and an [employer] guide given to patient.  Explained briefly about ordering supplies, but informed her we would cover in more detail later.  Patient was asleep and drowsy through most of procedure.  Wife is appreciative of education provided and would like to be hands on in the next pouch change.  WOC nursing team will continue to follow patient.    Maple Hudson RN BSN CWON Pager [phone]

## 2013-09-11 NOTE — Progress Notes (Addendum)
Patient received from 3S with wife at bedside.  Patient arousable and oriented x4.  Patient has full dose dilaudid PCA infusing and currently denies pain.  Vitals stable.  Patient and wife oriented to room and unit.  Will continue to monitor.

## 2013-09-11 NOTE — Progress Notes (Signed)
Utilization review completed.  

## 2013-09-12 DIAGNOSIS — E440 Moderate protein-calorie malnutrition: Secondary | ICD-10-CM | POA: Diagnosis present

## 2013-09-12 DIAGNOSIS — R404 Transient alteration of awareness: Secondary | ICD-10-CM

## 2013-09-12 DIAGNOSIS — I5022 Chronic systolic (congestive) heart failure: Secondary | ICD-10-CM

## 2013-09-12 LAB — BASIC METABOLIC PANEL
BUN: 39 mg/dL — ABNORMAL HIGH (ref 6–23)
Calcium: 8.1 mg/dL — ABNORMAL LOW (ref 8.4–10.5)
Chloride: 103 mEq/L (ref 96–112)
Creatinine, Ser: 1.21 mg/dL (ref 0.50–1.35)
GFR calc Af Amer: 70 mL/min — ABNORMAL LOW (ref >90)
Glucose, Bld: 110 mg/dL — ABNORMAL HIGH (ref 70–99)
Potassium: 3.9 mEq/L (ref 3.5–5.1)

## 2013-09-12 LAB — TACROLIMUS LEVEL: Tacrolimus (FK506) - LabCorp: 5.5 ng/mL

## 2013-09-12 LAB — CULTURE, BLOOD (ROUTINE X 2): Culture: NO GROWTH

## 2013-09-12 MED ORDER — HYDROMORPHONE HCL PF 1 MG/ML IJ SOLN
1.0000 mg | INTRAMUSCULAR | Status: DC | PRN
  Administered 2013-09-13 – 2013-09-19 (×18): 1 mg via INTRAVENOUS
  Filled 2013-09-12 (×18): qty 1

## 2013-09-12 MED ORDER — BOOST / RESOURCE BREEZE PO LIQD
1.0000 | Freq: Three times a day (TID) | ORAL | Status: DC
  Administered 2013-09-12: 1 via ORAL

## 2013-09-12 MED ORDER — PRO-STAT SUGAR FREE PO LIQD
30.0000 mL | ORAL | Status: DC
  Administered 2013-09-12: 30 mL via ORAL
  Filled 2013-09-12 (×2): qty 30

## 2013-09-12 MED ORDER — MAGNESIUM SULFATE 40 MG/ML IJ SOLN
4.0000 g | Freq: Once | INTRAMUSCULAR | Status: AC
  Administered 2013-09-12: 4 g via INTRAVENOUS
  Filled 2013-09-12: qty 100

## 2013-09-12 MED ORDER — KETOROLAC TROMETHAMINE 15 MG/ML IJ SOLN
15.0000 mg | Freq: Four times a day (QID) | INTRAMUSCULAR | Status: AC | PRN
  Administered 2013-09-12 – 2013-09-16 (×8): 15 mg via INTRAVENOUS
  Filled 2013-09-12 (×8): qty 1

## 2013-09-12 MED ORDER — OXYCODONE HCL 5 MG PO TABS: 10.0000 mg | ORAL_TABLET | ORAL | Status: DC | PRN

## 2013-09-12 MED ORDER — MAGNESIUM SULFATE 40 MG/ML IJ SOLN
2.0000 g | Freq: Once | INTRAMUSCULAR | Status: AC
  Administered 2013-09-12: 2 g via INTRAVENOUS
  Filled 2013-09-12: qty 50

## 2013-09-12 NOTE — Progress Notes (Signed)
Events noted. Less confused but still only oriented to person, time of year, president;  Not drinking much per wife States he was drinking 3 ensures per day prior to hospitalization  Awake, confused Soft, distended. Ostomy viable, some edema, no air in bag. +bs  -Delirium/confusion- D/c pca, IV pain meds prn, IV toradol prn - advised nurse to give first before dilaudid -Replace Magnesium for hypoMagnesia -stays on clears - do not adv diet. If ileus remains thru Sunday, will need to entertain Picc/TPN on Monday  Peter Clarke M. [name], MD, FACS General, Bariatric, & Minimally Invasive Surgery [facility], [place]

## 2013-09-12 NOTE — Progress Notes (Signed)
3 Days Post-Op  Subjective: Pt has no appetite, last time he ate was Sunday.  Denies n/v. Drank tea yesterday.  No flatus or stool in ostomy.  Seems  Very lethargic, on PCA dilaudid.  Got up with PT.  No dysuria.    Objective: Vital signs in last 24 hours: Temp:  [97.7 F (36.5 C)-99.7 F (37.6 C)] 99.7 F (37.6 C) (09/26 1007) Pulse Rate:  [91-101] 100 (09/26 1007) Resp:  [18-24] 18 (09/26 1007) BP: (124-154)/(74-87) 150/80 mmHg (09/26 1007) SpO2:  [90 %-95 %] 95 % (09/26 1007) Weight:  [185 lb 3 oz (84 kg)] 185 lb 3 oz (84 kg) (09/25 1506) Last BM Date: 09/08/13  Intake/Output from previous day: 09/25 0701 - 09/26 0700 In: 1330 [P.O.:220; I.V.:1110] Out: 2100 [Urine:2100] Intake/Output this shift: Total I/O In: 60 [P.O.:60] Out: 450 [Urine:450]  General appearance: cooperative, no distress and lethargic Resp: clear to auscultation bilaterally Cardio: regular rate and rhythm, S1, S2 normal, no murmur, click, rub or gallop GI: +bs abdomen is soft, non distended and tender at incision site.  Had quite a bit of pain while removing dressing.  Midline wound is beefy red with serosanguinous output, wet to dry dressing was applied.  Ostomy without gas or stool.  Right side staples are intact, dressing were removed. Extremities: extremities normal, atraumatic, no cyanosis or edema  Lab Results:   Recent Labs  09/10/13 0555 09/11/13 0634  WBC 9.5 10.4  HGB 10.2* 10.0*  HCT 28.4* 29.0*  PLT 204 175   BMET  Recent Labs  09/11/13 0634 09/12/13 0500  NA 136 137  K 3.9 3.9  CL 104 103  CO2 24 25  GLUCOSE 116* 110*  BUN 38* 39*  CREATININE 1.32 1.21  CALCIUM 8.1* 8.1*   Anti-infectives: Anti-infectives   Start     Dose/Rate Route Frequency Ordered Stop   09/07/13 2000  ertapenem (INVANZ) 1 g in sodium chloride 0.9 % 50 mL IVPB     1 g 100 mL/hr over 30 Minutes Intravenous Every 24 hours 09/07/13 1914     09/07/13 1430  piperacillin-tazobactam (ZOSYN) IVPB 3.375 g      3.375 g 12.5 mL/hr over 240 Minutes Intravenous  Once 09/07/13 1425 09/07/13 1516   09/07/13 1430  metroNIDAZOLE (FLAGYL) IVPB 500 mg     50 0 mg 100 mL/hr over 60 Minutes Intravenous  Once 09/07/13 1425 09/07/13 1617      Assessment/Plan: Diastolic heart failure CKD HTN Head and neck cancer S/p heart transplant Leukocytosis Diverticulitis S/p Hartmans (Dr. [name] 09/10/13  Plan: -continue with clears.  Would not advance any further until ostomy output.  Start on Calorie count, strict I&Os, dietician consult.  Prior to admission he was consuming 3 ensures per day.  We will hold off TPN for now.  If nutrition status does not improve in 1-2 days, place PICC line and start TPN -PT eval, mobilize -DC PCA due to oversedation.  PRN toradol. -Wife reports difficulties with swallowing pills, SLP consult -routine oral care, discussed with nursing -aggressive pulmonary toilet(269ml on IS) -Invanz  09/07/13 -daily wet to dry dressing changes, 2 staples right laparoscope site will need to be removed when ready    LOS: 5 days    Bonner Puna [facility] ANP-BC Pager [phone]  09/12/2013

## 2013-09-12 NOTE — Progress Notes (Signed)
[facility] Progress Note   SEVILLE DOWNS ZOX:[id number] DOB: 1946-10-07 DOA: 09/07/2013 PCP: [name], MD  Brief narrative: 67 year old male with remote history of cardiac transplant at [name] 11 years ago on chronic immunosuppressive therapy. 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 He also has underlying chronic kidney disease stage III. He presented to [facility] with abdominal pain associated with diarrhea but no melena or hematochezia and no nausea/vomiting. He initially presented to [facility] where CT of the abdomen and pelvis revealed abnormal thickening of the sigmoid colon with perforation. Given his underlying medical history she was subsequently transferred to [facility] for further treatment. After arrival here he has been evaluated by Dr. [name] with surgery who recommended conservative treatment with bowel rest and IV antibiotics. In review of his laboratory values he had a leukocytosis with white count of 13,000 and creatinine had increased to 2.96 from 1.57. TODAY patient confused except for name, patient does follow commands and is not combative   Assessment/Plan:  1. Diverticulitis with perforation -care as per Gen Surgery - now s/p laparoscopic assisted Hartman's procedure (sigmoid colectomy with end colostomy) w/ lysis of adhesions x 1 hr-stable surgically-slow advance diet per CCS-surgery has dc'd foley -Abdomen mildly tender left-sided ostomy in place right side incision covered and clean negative sign infection, continue to monitor  2. History of heart transplant/chronic immunosuppression/history of Chronic Systolic heart failure/New grade 2 DD -care as per CHF Team  -ECHO this admit showed grade 2 DD therefore watch IVF post op -Cardiology has been periodically updating transplant surgeon at [facility] (last on 9/25) -Patient's CHF stable  3. Head and neck cancer -tx'd  at [facility] -Imuran dose decreased to 100 mg Daily in August when dx'd with throat cancer. -Dr. [name] also d/w pt's transplant Cardiologist at [facility] who recommended switching Prograf to 1 mg BID SL post op for better absorption until can prove tolrates solid orals easily -continue home steroid dose   4. Hypomagnesemia -IV repletion   HTN (hypertension) -BP outside AHA guidelines but will not try to tightly control at this point   CKD (chronic kidney disease) stage 3, GFR 30-59 ml/min -renal function continues to improve      DVT prophylaxis: Subcutaneous heparin Code Status: Full Family Communication: Patient and family at bedside Disposition Plan/Expected LOS: Transfer to floor  Consultants: Cardiology General surgery  Procedures: 2-D echocardiogram - Left ventricle: The cavity size was normal. Wall thickness was normal. Systolic function was normal. The estimated ejection fraction was in the range of 60% to 65%. Wall motion was normal; there were no regional wall motion abnormalities. Features are consistent with a pseudonormal left ventricular filling pattern, with concomitant abnormal relaxation and increased filling pressure (grade 2 diastolic dysfunction). - Aortic valve: There was no stenosis. - Mitral valve: Trivial regurgitation. - Left atrium: The atrium was mildly dilated. - Right ventricle: Poorly visualized. The cavity size was normal. Systolic function was normal. - Right atrium: The atrium was mildly dilated. - Pulmonary arteries: No complete TR doppler jet so unable to estimate PA systolic pressure. - Systemic veins: IVC was not visualized.  09/07/2013 perforated sigmoid diverticulitis; S/P Laparoscopic assisted Hartman's procedure (sigmoid colectomy with end colostomy), lysis of adhesions x 1 hr by Dr. Gaynelle Adu (surgeon)     Antibiotics: Ertapenem 9/21 >>>  Flagyl 9/21 >> DC'd 9/21 Zosyn 9/21 >> DC'd 9/21     Objective: Blood pressure  154/87, pulse  100, temperature 98.5 F (36.9 C), temperature source Oral, resp. rate 20, height 6' (1.829 m), weight 84 kg (185 lb 3 oz), SpO2 93.00%.  Intake/Output Summary (Last 24 hours) at 09/12/13 4098 Last data filed at 09/12/13 0857  Gross per 24 hour  Intake 1311.17 ml  Output   1950 ml  Net -638.83 ml   Exam: General: A./O. x1 ,No acute respiratory distress Lungs: Clear to auscultation bilaterally without wheezes or crackles Cardiovascular: Regular rate and rhythm without murmur gallop or rub, no peripheral edema or JVD Abdomen: modestly tender lower abdomen, nondistended, soft, hypoactive bowel sounds positive, no rebound, no ascites, no appreciable mass, ostomy bag in place on the left lower quadrant negative leaks, right abdominal vertical incision covered and clean negative sign of infection Musculoskeletal: No significant cyanosis, clubbing of bilateral lower extremities  Scheduled Meds:  Scheduled Meds: . azaTHIOprine  100 mg Oral Daily  . ertapenem  1 g Intravenous Q24H  . heparin  5,000 Units Subcutaneous Q8H  . metoprolol  5 mg Intravenous Q6H  . predniSONE  5 mg Oral Daily  . sodium chloride  3 mL Intravenous Q12H  . tacrolimus  1 mg Sublingual BID   Data Reviewed: Basic Metabolic Panel:  Recent Labs Lab 09/08/13 0340 09/09/13 0520 09/10/13 0555 09/11/13 0634 09/12/13 0500  NA 135 134* 139 136 137  K 5.2* 4.2 4.1 3.9 3.9  CL 102 101 108 104 103  CO2 24 21 18* 24 25  GLUCOSE 97 92 82 116* 110*  BUN 36* 35* 34* 38* 39*  CREATININE 1.84* 1.56* 1.37* 1.32 1.21  CALCIUM 8.4 8.3* 7.6* 8.1* 8.1*  MG  --   --   --  1.0* 1.3*   Liver Function Tests:  Recent Labs Lab 09/07/13 1142 09/08/13 0340  AST 14 13  ALT 11 9  ALKPHOS 54 43  BILITOT 0.7 0.5  PROT 6.5 5.3*  ALBUMIN 3.3* 2.6*   CBC:  Recent Labs Lab 09/07/13 1142 09/08/13 0340 09/09/13 0520 09/10/13 0555 09/11/13 0634  WBC 13.2* 13.0* 9.6 9.5 10.4  NEUTROABS 12.0*  --   --   --   --    HGB 11.5* 9.8* 10.2* 10.2* 10.0*  HCT 33.9* 28.6* 29.6* 28.4* 29.0*  MCV 99.7 98.6 97.4 96.9 97.0  PLT 240 207 194 204 175    Recent Results (from the past 240 hour(s))  CULTURE, BLOOD (ROUTINE X 2)     Status: None   Collection Time    09/07/13 11:41 AM      Result Value Range Status   Specimen Description BLOOD LEFT ANTECUBITAL   Final   Special Requests BOTTLES DRAWN AEROBIC AND ANAEROBIC 8CC   Final   Culture NO GROWTH 5 DAYS   Final   Report Status 09/12/2013 FINAL   Final  CULTURE, BLOOD (ROUTINE X 2)     Status: None   Collection Time    09/07/13 11:43 AM      Result Value Range Status   Specimen Description BLOOD RIGHT ANTECUBITAL   Final   Special Requests BOTTLES DRAWN AEROBIC AND ANAEROBIC 12CC   Final   Culture NO GROWTH 5 DAYS   Final   Report Status 09/12/2013 FINAL   Final  MRSA PCR SCREENING     Status: None   Collection Time    09/07/13  7:23 PM      Result Value Range Status   MRSA by PCR NEGATIVE  NEGATIVE Final   Comment:  The GeneXpert MRSA Assay (FDA     approved for NASAL specimens     only), is one component of a     comprehensive MRSA colonization     surveillance program. It is not     intended to diagnose MRSA     infection nor to guide or     monitor treatment for     MRSA infections.  URINE CULTURE     Status: None   Collection Time    09/08/13 10:11 AM      Result Value Range Status   Specimen Description URINE, CLEAN CATCH   Final   Special Requests NONE   Final   Culture  Setup Time     Final   Value: 09/08/2013 13:29     Performed at [facility] Count     Final   Value: NO GROWTH     Performed at [facility]   Culture     Final   Value: NO GROWTH     Performed at [facility]   Report Status 09/09/2013 FINAL   Final     Studies:  Recent x-ray studies have been reviewed in detail by the Attending Physician   On-Call/Text Page:      [email or web address]      password [email or web address]  If 7PM-7AM,  please contact night-coverage [email or web address] Password [facility] 09/12/2013, 9:21 AM   LOS: 5 days    Carolyne Littles, J,MD [phone] 09/12/2013, 9:21 AM

## 2013-09-12 NOTE — Progress Notes (Signed)
Pt refuses wet to dry drsg last night.Writer tried to changed drsg this AM but pt still refuses.

## 2013-09-12 NOTE — Clinical Social Work Placement (Signed)
Clinical Social Work Department CLINICAL SOCIAL WORK PLACEMENT NOTE 09/12/2013  Patient:  Peter Clarke, Peter Clarke  Account Number:  [id number] Admit date:  09/07/2013  Clinical Social Worker:  Hulan Fray  Date/time:  09/12/2013 03:45 PM  Clinical Social Work is seeking post-discharge placement for this patient at the following level of care:   SKILLED NURSING   (*CSW will update this form in Epic as items are completed)   09/12/2013  Patient/family provided with [facility] Department of Clinical Social Work's list of facilities offering this level of care within the geographic area requested by the patient (or if unable, by the patient's family).  09/12/2013  Patient/family informed of their freedom to choose among providers that offer the needed level of care, that participate in Medicare, Medicaid or managed care program needed by the patient, have an available bed and are willing to accept the patient.  09/12/2013  Patient/family informed of [facility]' ownership interest in [facility], as well as of the fact that they are under no obligation to receive care at this facility.  PASARR submitted to EDS on 09/12/2013 PASARR number received from EDS on 09/12/2013  FL2 transmitted to all facilities in geographic area requested by pt/family on  09/12/2013 FL2 transmitted to all facilities within larger geographic area on   Patient informed that his/her managed care company has contracts with or will negotiate with  certain facilities, including the following:     Patient/family informed of bed offers received:   Patient chooses bed at  Physician recommends and patient chooses bed at    Patient to be transferred to  on   Patient to be transferred to facility by   The following physician request were entered in Epic:   Additional Comments:

## 2013-09-12 NOTE — Progress Notes (Signed)
Speech Language Pathology Dysphagia Treatment Patient Details Name: Peter Clarke MRN: [id number] DOB: 1946/07/16 Today's Date: 09/12/2013 Time: 4098-1191 SLP Time Calculation (min): 17 min  Assessment / Plan / Recommendation Clinical Impression  Pt is not oriented to situation and has poor short term memory. He is not able to confirm or deny any difficulty swallowing pills, which was reprotedly heard from wife. Pt and daughter could not recall any history of trouble swallowing due to tonsilar cancer. Pts daughter does confirm dry mouth resulting from chemo and radiation. No difficulty swallowing observed with thin or puree solids.  Offered education regarding precautions and strategies to RN and daughter. SLP recommends pt continue current diet, no f/u needed at this time. RN agrees to contact SLP if concerns arise.     Diet Recommendation       SLP Plan     Pertinent Vitals/Pain NA   Swallowing Goals     General Temperature Spikes Noted: No Respiratory Status: Supplemental O2 delivered via (comment) Behavior/Cognition: Alert;Cooperative;Pleasant mood;Confused Oral Cavity - Dentition: Adequate natural dentition Patient Positioning: Upright in bed  Oral Cavity - Oral Hygiene Does patient have any of the following "at risk" factors?: Tongue - coated;Mucous Membranes - reddened;Saliva - thick, dry mouth;Nutritional status - inadequate;Oxygen therapy - cannula, mask, simple oxygen devices Patient is HIGH RISK - Oral Care Protocol followed (see row info): Yes Patient is AT RISK - Oral Care Protocol followed (see row info): Yes   Dysphagia Treatment     GO    Harlon Ditty, MA CCC-SLP [phone]  Claudine Mouton 09/12/2013, 2:57 PM

## 2013-09-12 NOTE — Evaluation (Addendum)
Physical Therapy Evaluation Patient Details Name: Peter Clarke MRN: [id number] DOB: Jul 10, 1946 Today's Date: 09/12/2013 Time: 4696-2952 PT Time Calculation (min): 19 min  PT Assessment / Plan / Recommendation History of Present Illness  67 year old male with remote history of cardiac transplant at [name] 11 years ago on chronic immunosuppressive therapy. 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 He also has underlying chronic kidney disease stage III. He presented to [facility] with abdominal pain associated with diarrhea but no melena or hematochezia and no nausea/vomiting. He initially presented to [facility] where CT of the abdomen and pelvis revealed abnormal thickening of the sigmoid colon with perforation. Given his underlying medical history she was subsequently transferred to [facility] for further treatment. After arrival here he has been evaluated by Dr. [name] with surgery who recommended conservative treatment with bowel rest and IV antibiotics. In review of his laboratory values he had a leukocytosis with white count of 13,000 and creatinine had increased to 2.96 from 1.57. Pt has no appetite, last time he ate was Sunday.  Denies n/v. Drank tea yesterday.  No flatus or stool in ostomy.  Seems  Very lethargic, on PCA dilaudid.  Got up with PT.  No dysuria.   Clinical Impression  Pt admitted for Lap. Assisted Colectomy. Pt currently with functional limitations due to the deficits listed below (see PT Problem List).  [name] participation in PT limited today due to abdominal pain and SOB.  Pt able to perform bed mobility with min-mod assist and sit<>stand with min assist.  Pt and pt's wife reports pt was independent prior to hospitalization and does not have any DME at home. PT recommends SNF placement at this time.  Pt will benefit from skilled PT to increase their independence and safety with mobility  to allow discharge.     PT Assessment  Patient needs continued PT services    Follow Up Recommendations  SNF;Supervision/Assistance - 24 hour    Does the patient have the potential to tolerate intense rehabilitation      Barriers to Discharge        Equipment Recommendations  Rolling walker with 5" wheels    Recommendations for Other Services     Frequency Min 3X/week    Precautions / Restrictions Precautions Precautions: Fall Restrictions Weight Bearing Restrictions: No   Pertinent Vitals/Pain Pt reports 10/10 pain at rest and during sitting, wife reports PCA pump just discontinued but the RN stated they would be in shortly to give pt pain medication. Pt repositioned to comfort at end of session.      Mobility  Bed Mobility Bed Mobility: Rolling Right;Right Sidelying to Sit;Sitting - Scoot to [place] of Bed;Sit to Sidelying Right Rolling Right: 4: Min assist;With rail Right Sidelying to Sit: 3: Mod assist;With rails Sitting - Scoot to Edge of Bed: 3: Mod assist;With rail Sit to Sidelying Right: 3: Mod assist Details for Bed Mobility Assistance: Min A-Mod A during bed mobility to guide trunk and assist LEs on/off bed. VC's for technique. [name] O2 sat in sitting on room air: 86-88%, pt put on 2L of O2 [place] and O2 sat remained around 88-90% with SOB.  PT encouraged pt to breathe in through nose and out through mouth. Transfers Transfers: Sit to Stand;Stand to Sit Sit to Stand: 4: Min assist;With upper extremity assist Stand to Sit: With upper extremity assist;4: Min assist Details for Transfer Assistance: Min assist for safety and  due to SOB while standing to use urinal with wife's assistance.  VC's for hand placement.   Ambulation/Gait Ambulation/Gait Assistance: Not tested (comment)    Exercises     PT Diagnosis: Difficulty walking;Acute pain;Generalized weakness  PT Problem List: Decreased strength;Decreased activity tolerance;Decreased balance;Decreased mobility;Decreased  knowledge of use of DME;Decreased safety awareness;Decreased knowledge of precautions;Pain PT Treatment Interventions: DME instruction;Gait training;Functional mobility training;Therapeutic activities;Therapeutic exercise;Balance training;Neuromuscular re-education;Patient/family education     PT Goals(Current goals can be found in the care plan section) Acute Rehab PT Goals Patient Stated Goal: none specified  PT Goal Formulation: With patient/family Time For Goal Achievement: 09/26/13 Potential to Achieve Goals: Good  Visit Information  Last PT Received On: 09/12/13 Assistance Needed: +1 History of Present Illness: 67 year old male with remote history of cardiac transplant at [name] 11 years ago on chronic immunosuppressive therapy. 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 He also has underlying chronic kidney disease stage III. He presented to [facility] with abdominal pain associated with diarrhea but no melena or hematochezia and no nausea/vomiting. He initially presented to [facility] where CT of the abdomen and pelvis revealed abnormal thickening of the sigmoid colon with perforation. Given his underlying medical history she was subsequently transferred to [facility] for further treatment. After arrival here he has been evaluated by Dr. [name] with surgery who recommended conservative treatment with bowel rest and IV antibiotics. In review of his laboratory values he had a leukocytosis with white count of 13,000 and creatinine had increased to 2.96 from 1.57. Pt has no appetite, last time he ate was Sunday.  Denies n/v. Drank tea yesterday.  No flatus or stool in ostomy.  Seems  Very lethargic, on PCA dilaudid.  Got up with PT.  No dysuria.        Prior Functioning  Home Living Family/patient expects to be discharged to:: Private residence Living Arrangements: Spouse/significant  other Available Help at Discharge: Family Type of Home: House Home Access: Stairs to enter Secretary/administrator of Steps: 1 Entrance Stairs-Rails: None Home Layout: One level Home Equipment: None Prior Function Level of Independence: Independent Communication Communication: No difficulties    Cognition  Cognition Arousal/Alertness: Lethargic Overall Cognitive Status: Impaired/Different from baseline Area of Impairment: Memory;Problem solving Memory: Decreased short-term memory (Pt had difficulty recalling PMH and why he's in hospital.) Problem Solving: Slow processing;Requires verbal cues;Decreased initiation    Extremity/Trunk Assessment Lower Extremity Assessment Lower Extremity Assessment: Generalized weakness (Unable to MMT due to abdominal pain (10/10) in sitting.)   Balance    End of Session PT - End of Session Equipment Utilized During Treatment: Oxygen Activity Tolerance: Patient limited by fatigue;Patient limited by pain Patient left: in bed;with call bell/phone within reach;with family/visitor present  GP     Sol Blazing 09/12/2013, 1:44 PM

## 2013-09-12 NOTE — Evaluation (Signed)
I have reviewed this note and agree with all findings. Kati Valerian Jewel, PT, DPT Pager: [phone]   

## 2013-09-12 NOTE — Progress Notes (Signed)
   Tacrolimus level acceptable at 5.5 (goal 5-10). Continue current regimen. Will recheck tacrolimus level on Monday.   Once taking full po can switch tacrolimus back to po (versus sublingual).   Truman Hayward 5:28 PM

## 2013-09-12 NOTE — Clinical Social Work Note (Addendum)
Clinical Social Work Department BRIEF PSYCHOSOCIAL ASSESSMENT 09/12/2013  Patient:  SENICA, CRALL     Account Number:  [id number]     Admit date:  09/07/2013  Clinical Social Worker:  Hulan Fray  Date/Time:  09/12/2013 02:55 PM  Referred by:  CSW  Date Referred:  09/12/2013 Referred for  SNF Placement   Other Referral:   Interview type:  Patient Other interview type:   Daughter- Megan  Wife- Renard Hamper    PSYCHOSOCIAL DATA Living Status:  FAMILY Admitted from facility:   Level of care:   Primary support name:  Renard Hamper Primary support relationship to patient:  SPOUSE Degree of support available:   supportive    CURRENT CONCERNS Current Concerns  Post-Acute Placement   Other Concerns:    SOCIAL WORK ASSESSMENT / PLAN Clinical Social Worker reviewed chart and noticed [name] recommendation for SNF placement at discharge. CSW introduced self and explained reason for visit. Patient initially did not want CSW to proceed with discussion, but daughter was at bedside and was agreeable for explaination. CSW provided daughter with SNF packet and explained SNF process.    CSW was able to call wife and she was agreeable for CSW to proceed with initiating SNF referral. Wife preferred search in [place]. CSW will complete FL2 for MD's signature and will update when bed offers are made.   Assessment/plan status:  Psychosocial Support/Ongoing Assessment of Needs Other assessment/ plan:   Blue medicare clinicals have been submitted for prior approval. Information/referral to community resources:   SNF packet    PATIENT'S/FAMILY'S RESPONSE TO PLAN OF CARE: Patient did not appear agreeable to listen to discussion of SNF process, but daughter and wife were agreeable for CSW to intiate SNF referral in [place].

## 2013-09-12 NOTE — Progress Notes (Signed)
INITIAL NUTRITION ASSESSMENT  DOCUMENTATION CODES Per approved criteria  -Non-severe (moderate) malnutrition in the context of acute illness or injury  Pt meets criteria for Moderate MALNUTRITION in the context of Acute as evidenced by estimated PO intake <75% of estimated energy intake for 3 weeks and moderate fat and muscle wasting evidenced in physical exam.  INTERVENTION: Provide Resource Breeze TID until diet advanced Provide Pro-stat once daily Provide Ensure Complete TID when diet advanced Encourage PO intake Calorie Count Recommend appetite stimulant  NUTRITION DIAGNOSIS: Inadequate oral intake related to lack of appetite as evidenced by pt not eating.   Goal: Pt to meet >/= 90% of their estimated nutrition needs  Monitor:  PO intake Diet advancement Weight Labs  Reason for Assessment: Consult  67 y.o. 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 at [facility] currently undergoing chemotherapy and radiation therapy, CKD stage 3, presents with abdominal pain. He was transferred from [facility] due to findings of abnormal thickening of sigmoid colon with perforation. Pt is now 3 days post-op of Laparoscopic assisted Hartman's procedure (sigmoid colectomy with end colostomy) and lysis of adhesions.  Pt reports that he has no appetite and has not eaten anything today or last night. He reports that for 3 weeks PTA he was only drinking Ensure supplements 3 times daily and eating soup. He reports weighing 172-175 lbs for the past few months but, his usual body weight is 185 lbs. Pt denies oral pain or swallowing difficulty at this time but, reports that for a while he was having difficulty eating because foods cause his mouth to feel like it was burning. Encouraged PO intake. Per MD  note plan is to keep pt on clears and if ileus remains thru Sunday will consider TPN on Monday.   Nutrition Focused Physical Exam:  Subcutaneous Fat:  Orbital Region: moderate wasting Upper Arm Region: moderate wasting Thoracic and Lumbar Region: NA  Muscle:  Temple Region: mild wasting Clavicle Bone Region: mild/moderate wasting Clavicle and Acromion Bone Region: mild wasting Scapular Bone Region: NA Dorsal Hand: moderate/severe wasting Patellar Region: mild wasting Anterior Thigh Region: moderate wasting Posterior Calf Region: NA  Edema: non-pitting RLE and LLE edema   Height: Ht Readings from Last 1 Encounters:  09/11/13 6' (1.829 m)    Weight: Wt Readings from Last 1 Encounters:  09/11/13 185 lb 3 oz (84 kg)    Ideal Body Weight: 178 lbs  % Ideal Body Weight: 96%  Wt Readings from Last 10 Encounters:  09/11/13 185 lb 3 oz (84 kg)  09/11/13 185 lb 3 oz (84 kg)  08/20/13 176 lb (79.833 kg)  07/23/13 178 lb (80.74 kg)  07/07/13 176 lb 6.4 oz (80.015 kg)  07/07/13 176 lb 6.4 oz (80.015 kg)  06/13/13 182 lb (82.555 kg)    Usual Body Weight: 185 lbs  % Usual Body Weight: 100%  BMI:  Body mass index is 25.11 kg/(m^2).  Estimated Nutritional Needs: Kcal: 2000-2200 Protein: 100-110 grams Fluid: >/=2.2 L/day  Skin: non-pitting RLE and LLE edema; abdominal incisions  Diet Order: Clear Liquid  EDUCATION NEEDS: -No education needs identified at this time   Intake/Output Summary (Last 24 hours) at 09/12/13 1352 Last data filed at 09/12/13 1009  Gross per 24 hour  Intake 1304.17 ml  Output   2150 ml  Net -845.83 ml    Last BM: 9/22   Labs:   Recent Labs Lab 09/10/13 0555 09/11/13 0634 09/12/13 0500  NA 139 136 137  K 4.1 3.9 3.9  CL 108 104 103  CO2 18* 24 25  BUN 34* 38* 39*  CREATININE 1.37* 1.32 1.21  CALCIUM 7.6* 8.1* 8.1*  MG  --  1.0* 1.3*  GLUCOSE 82 116* 110*    CBG (last 3)  No results found for this basename: GLUCAP,  in  the last 72 hours  Scheduled Meds: . azaTHIOprine  100 mg Oral Daily  . ertapenem  1 g Intravenous Q24H  . heparin  5,000 Units Subcutaneous Q8H  . metoprolol  5 mg Intravenous Q6H  . predniSONE  5 mg Oral Daily  . sodium chloride  3 mL Intravenous Q12H  . tacrolimus  1 mg Sublingual BID    Continuous Infusions: . dextrose 5 % and 0.45 % NaCl with KCl 20 mEq/L 50 mL/hr at 09/12/13 0448    Past Medical History  Diagnosis Date  . Hypertension   . H/O heart transplant 2003  . Wears glasses   . Wears dentures     upper  . Renal insufficiency   . Cancer     tonsil  . Throat cancer     Past Surgical History  Procedure Laterality Date  . Heart transplant N/A 2003    [facility]-  . Appendectomy    . Colonoscopy    . Hernia repair  2008    rt ing  . Cardiac catheterization  2011  . Tonsillectomy Left 07/07/2013    Procedure: TONSILLECTOMY;  Surgeon: Darletta Moll, MD;  Location: [facility];  Service: ENT;  Laterality: Left;  . Direct laryngoscopy N/A 07/07/2013    Procedure: DIRECT LARYNGOSCOPY WITH BIOPSY;  Surgeon: Darletta Moll, MD;  Location: [facility];  Service: ENT;  Laterality: N/A;  . Colon resection N/A 09/09/2013    Procedure: Lap. Assisted Colectomy, lysis of adhesions, Colostomy;  Surgeon: Atilano Ina, MD;  Location: [facility];  Service: General;  Laterality: N/A;    Ian Malkin RD, LDN Inpatient Clinical Dietitian Pager: [phone] After Hours Pager: [phone]

## 2013-09-13 DIAGNOSIS — C760 Malignant neoplasm of head, face and neck: Secondary | ICD-10-CM

## 2013-09-13 LAB — MAGNESIUM: Magnesium: 2.3 mg/dL (ref 1.5–2.5)

## 2013-09-13 LAB — CBC WITH DIFFERENTIAL/PLATELET
Basophils Absolute: 0 10*3/uL (ref 0.0–0.1)
Basophils Relative: 0 % (ref 0–1)
Eosinophils Absolute: 0 10*3/uL (ref 0.0–0.7)
Eosinophils Relative: 0 % (ref 0–5)
Hemoglobin: 10.7 g/dL — ABNORMAL LOW (ref 13.0–17.0)
Lymphs Abs: 0.6 10*3/uL — ABNORMAL LOW (ref 0.7–4.0)
MCH: 33.2 pg (ref 26.0–34.0)
MCHC: 35.4 g/dL (ref 30.0–36.0)
MCV: 93.8 fL (ref 78.0–100.0)
Monocytes Absolute: 0.5 10*3/uL (ref 0.1–1.0)
Neutrophils Relative %: 89 % — ABNORMAL HIGH (ref 43–77)
Platelets: 222 10*3/uL (ref 150–400)
RDW: 12.9 % (ref 11.5–15.5)

## 2013-09-13 LAB — COMPREHENSIVE METABOLIC PANEL
ALT: 39 U/L (ref 0–53)
AST: 70 U/L — ABNORMAL HIGH (ref 0–37)
Albumin: 1.9 g/dL — ABNORMAL LOW (ref 3.5–5.2)
CO2: 24 mEq/L (ref 19–32)
Calcium: 8 mg/dL — ABNORMAL LOW (ref 8.4–10.5)
Chloride: 100 mEq/L (ref 96–112)
GFR calc Af Amer: 69 mL/min — ABNORMAL LOW (ref >90)
GFR calc non Af Amer: 60 mL/min — ABNORMAL LOW (ref >90)
Potassium: 3.5 mEq/L (ref 3.5–5.1)
Sodium: 134 mEq/L — ABNORMAL LOW (ref 135–145)
Total Bilirubin: 5.3 mg/dL — ABNORMAL HIGH (ref 0.3–1.2)

## 2013-09-13 LAB — PHOSPHORUS: Phosphorus: 1.9 mg/dL — ABNORMAL LOW (ref 2.3–4.6)

## 2013-09-13 MED ORDER — ENSURE PUDDING PO PUDG
1.0000 | Freq: Three times a day (TID) | ORAL | Status: DC
  Administered 2013-09-13 – 2013-09-14 (×2): 1 via ORAL

## 2013-09-13 NOTE — Progress Notes (Signed)
Patient ambulated with staff assistance , tolerated well . Up in the geri- chair X 3 this shift.

## 2013-09-13 NOTE — Progress Notes (Addendum)
[facility] Progress Note   Peter Clarke ZOX:[id number] DOB: 1946/06/14 DOA: 09/07/2013 PCP: [name], MD  Brief narrative: 67 year old male with remote history of cardiac transplant at [name] 11 years ago on chronic immunosuppressive therapy. 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 He also has underlying chronic kidney disease stage III. He presented to [facility] with abdominal pain associated with diarrhea but no melena or hematochezia and no nausea/vomiting. He initially presented to [facility] where CT of the abdomen and pelvis revealed abnormal thickening of the sigmoid colon with perforation. Given his underlying medical history she was subsequently transferred to [facility] for further treatment. After arrival here he has been evaluated by Dr. [name] with surgery who recommended conservative treatment with bowel rest and IV antibiotics. In review of his laboratory values he had a leukocytosis with white count of 13,000 and creatinine had increased to 2.96 from 1.57. 09/14/2003 to patient confused except for name, patient does follow commands and is not combative TODAY alert and oriented x2 ( knows name and current location), follows all commands. Patient's wife states patient complaining that food does not taste correctly and that his throat feels scratchy/raw (patient status post recent chemotherapy/XRT)     Assessment/Plan:  1. Diverticulitis with perforation -care as per Gen Surgery - now s/p laparoscopic assisted Hartman's procedure (sigmoid colectomy with end colostomy) w/ lysis of adhesions x 1 hr-stable surgically-slow advance diet per [facility]-surgery has dc'd foley -Abdomen mildly tender left-sided ostomy in place right side incision covered and clean negative sign infection, appropriately tender, continue to monitor -Continue ertapenem per surgery  2. History of heart  transplant/chronic immunosuppression/history of Chronic Systolic heart failure/New grade 2 DD -care as per CHF Team  -ECHO this admit showed grade 2 DD therefore watch IVF post op -Cardiology has been periodically updating transplant surgeon at [facility] (last on 9/25) -Patient's CHF stable -Counseled wife to continue to encourage patient to drink fluids   3. Head and neck cancer -tx'd at [place] -Imuran dose decreased to 100 mg Daily in August when dx'd with throat cancer. -Dr. [name] also d/w pt's transplant Cardiologist at [facility] who recommended switching Prograf to 1 mg BID SL post op for better absorption until can prove tolrates solid orals easily -continue home steroid dose   4. Hypomagnesemia -IV repletion   5. HTN (hypertension) -BP within AHA guidelines.    6. CKD (chronic kidney disease) stage 3, GFR 30-59 ml/min -renal function continues to improve current creatinine= 1.22  7. Malnutrition; patient refusing to drink and eat is resulting in a malnourished state. Prealbumin/albumin both low.  - Increase dextrose 5% in 1/2 NS + 20 meq daily to 171ml/hr -Will need to consider PEG tube placement if patient will not/cannot increase his caloric intake -Check potassium of 1600 today     DVT prophylaxis: Subcutaneous heparin Code Status: Full Family Communication: Patient and family at bedside Disposition Plan/Expected LOS: Transfer to floor  Consultants: Cardiology General surgery  Procedures: 2-D echocardiogram - Left ventricle: The cavity size was normal. Wall thickness was normal. Systolic function was normal. The estimated ejection fraction was in the range of 60% to 65%. Wall motion was normal; there were no regional wall motion abnormalities. Features are consistent with a pseudonormal left ventricular filling pattern, with concomitant abnormal relaxation and increased filling pressure (grade 2 diastolic dysfunction). - Aortic valve: There was no stenosis. -  Mitral valve: Trivial regurgitation. - Left atrium:  The atrium was mildly dilated. - Right ventricle: Poorly visualized. The cavity size was normal. Systolic function was normal. - Right atrium: The atrium was mildly dilated. - Pulmonary arteries: No complete TR doppler jet so unable to estimate PA systolic pressure. - Systemic veins: IVC was not visualized.  09/07/2013 perforated sigmoid diverticulitis; S/P Laparoscopic assisted Hartman's procedure (sigmoid colectomy with end colostomy), lysis of adhesions x 1 hr by Dr. Gaynelle Adu (surgeon)     Antibiotics: Ertapenem 9/21 >>>  Flagyl 9/21 >> DC'd 9/21 Zosyn 9/21 >> DC'd 9/21     Objective: Blood pressure 122/78, pulse 96, temperature 98.1 F (36.7 C), temperature source Oral, resp. rate 18, height 6' (1.829 m), weight 84 kg (185 lb 3 oz), SpO2 97.00%.  Intake/Output Summary (Last 24 hours) at 09/13/13 1809 Last data filed at 09/13/13 1417  Gross per 24 hour  Intake 1401.17 ml  Output   3500 ml  Net -2098.83 ml   Exam: General: A./O. x2 ,No acute respiratory distress Lungs: Clear to auscultation bilaterally without wheezes or crackles Cardiovascular: Regular rate and rhythm without murmur gallop or rub, no peripheral edema or JVD Abdomen: modestly tender lower abdomen, nondistended, soft, hypoactive bowel sounds positive, no rebound, no ascites, no appreciable mass, ostomy bag in place on the left lower quadrant negative leaks, right abdominal vertical incision covered and clean negative sign of infection Musculoskeletal: No significant cyanosis, clubbing of bilateral lower extremities, patient able to rise to sitting position without assistance  Scheduled Meds:  Scheduled Meds: . azaTHIOprine  100 mg Oral Daily  . ertapenem  1 g Intravenous Q24H  . feeding supplement  1 Container Oral TID BM  . heparin  5,000 Units Subcutaneous Q8H  . metoprolol  5 mg Intravenous Q6H  . predniSONE  5 mg Oral Daily  . sodium chloride  3  mL Intravenous Q12H  . tacrolimus  1 mg Sublingual BID   Data Reviewed: Basic Metabolic Panel:  Recent Labs Lab 09/09/13 0520 09/10/13 0555 09/11/13 0634 09/12/13 0500 09/12/13 2340 09/13/13 0435  NA 134* 139 136 137  --  134*  K 4.2 4.1 3.9 3.9  --  3.5  CL 101 108 104 103  --  100  CO2 21 18* 24 25  --  24  GLUCOSE 92 82 116* 110*  --  115*  BUN 35* 34* 38* 39*  --  39*  CREATININE 1.56* 1.37* 1.32 1.21  --  1.22  CALCIUM 8.3* 7.6* 8.1* 8.1*  --  8.0*  MG  --   --  1.0* 1.3*  --  2.3  PHOS  --   --   --   --  1.9* 1.5*   Liver Function Tests:  Recent Labs Lab 09/07/13 1142 09/08/13 0340 09/13/13 0435  AST 14 13 70*  ALT 11 9 39  ALKPHOS 54 43 60  BILITOT 0.7 0.5 5.3*  PROT 6.5 5.3* 5.3*  ALBUMIN 3.3* 2.6* 1.9*   CBC:  Recent Labs Lab 09/07/13 1142 09/08/13 0340 09/09/13 0520 09/10/13 0555 09/11/13 0634 09/13/13 0435  WBC 13.2* 13.0* 9.6 9.5 10.4 9.7  NEUTROABS 12.0*  --   --   --   --  8.6*  HGB 11.5* 9.8* 10.2* 10.2* 10.0* 10.7*  HCT 33.9* 28.6* 29.6* 28.4* 29.0* 30.2*  MCV 99.7 98.6 97.4 96.9 97.0 93.8  PLT 240 207 194 204 175 222    Recent Results (from the past 240 hour(s))  CULTURE, BLOOD (ROUTINE X 2)  Status: None   Collection Time    09/07/13 11:41 AM      Result Value Range Status   Specimen Description BLOOD LEFT ANTECUBITAL   Final   Special Requests BOTTLES DRAWN AEROBIC AND ANAEROBIC 8CC   Final   Culture NO GROWTH 5 DAYS   Final   Report Status 09/12/2013 FINAL   Final  CULTURE, BLOOD (ROUTINE X 2)     Status: None   Collection Time    09/07/13 11:43 AM      Result Value Range Status   Specimen Description BLOOD RIGHT ANTECUBITAL   Final   Special Requests BOTTLES DRAWN AEROBIC AND ANAEROBIC 12CC   Final   Culture NO GROWTH 5 DAYS   Final   Report Status 09/12/2013 FINAL   Final  MRSA PCR SCREENING     Status: None   Collection Time    09/07/13  7:23 PM      Result Value Range Status   MRSA by PCR NEGATIVE  NEGATIVE  Final   Comment:            The GeneXpert MRSA Assay (FDA     approved for NASAL specimens     only), is one component of a     comprehensive MRSA colonization     surveillance program. It is not     intended to diagnose MRSA     infection nor to guide or     monitor treatment for     MRSA infections.  URINE CULTURE     Status: None   Collection Time    09/08/13 10:11 AM      Result Value Range Status   Specimen Description URINE, CLEAN CATCH   Final   Special Requests NONE   Final   Culture  Setup Time     Final   Value: 09/08/2013 13:29     Performed at [facility] Count     Final   Value: NO GROWTH     Performed at [facility]   Culture     Final   Value: NO GROWTH     Performed at [facility]   Report Status 09/09/2013 FINAL   Final     Studies:  Recent x-ray studies have been reviewed in detail by the Attending Physician   On-Call/Text Page:      [email or web address]      password [email or web address]  If 7PM-7AM, please contact night-coverage [email or web address] Password [email or web address] 09/13/2013, 6:09 PM   LOS: 6 days    Carolyne Littles, J,MD [phone] 09/13/2013, 6:09 PM

## 2013-09-13 NOTE — Progress Notes (Signed)
4 Days Post-Op   Assessment: s/p Procedure(s): Lap. Assisted Colectomy, lysis of adhesions, Colostomy Patient Active Problem List   Diagnosis Date Noted  . Malnutrition of moderate degree 09/12/2013  . Hypomagnesemia 09/12/2013  . Chronic diastolic heart failure 09/08/2013  . Pre-operative cardiovascular examination 09/08/2013  . Fever 09/08/2013  . Leukocytosis 09/08/2013  . CKD (chronic kidney disease) stage 3, GFR 30-59 ml/min 09/08/2013  . HTN (hypertension) 09/07/2013  . Diverticulitis with perforation 09/07/2013  . Head and neck cancer 07/26/2013  . History of heart transplant/chronic immunosupression 06/17/2013    Progressing with return of bowel function  Plan: Advance diet  Subjective: Feels a bit better, still feels overwhelmed with all that is going on. No nausea, taking clears, ostomy has put out a large amount of liquid stool over last 24 hours  Objective: Vital signs in last 24 hours: Temp:  [98.1 F (36.7 C)-99.7 F (37.6 C)] 98.1 F (36.7 C) (09/27 0541) Pulse Rate:  [92-100] 100 (09/27 0541) Resp:  [18-20] 20 (09/27 0541) BP: (134-150)/(76-86) 134/86 mmHg (09/27 0541) SpO2:  [93 %-95 %] 95 % (09/27 0541)   Intake/Output from previous day: 09/26 0701 - 09/27 0700 In: 1701.2 [P.O.:855; I.V.:846.2] Out: 3400 [Urine:1450; Stool:1950]  General appearance: alert, cooperative, appears older than stated age, fatigued and no distress Resp: clear to auscultation bilaterally GI: Slight distention, not tender except incision, BS+  Incision: Clean and being packed  Lab Results:   Recent Labs  09/11/13 0634 09/13/13 0435  WBC 10.4 9.7  HGB 10.0* 10.7*  HCT 29.0* 30.2*  PLT 175 222   BMET  Recent Labs  09/12/13 0500 09/13/13 0435  NA 137 134*  K 3.9 3.5  CL 103 100  CO2 25 24  GLUCOSE 110* 115*  BUN 39* 39*  CREATININE 1.21 1.22  CALCIUM 8.1* 8.0*    MEDS, Scheduled . azaTHIOprine  100 mg Oral Daily  . ertapenem  1 g Intravenous Q24H   . feeding supplement  30 mL Oral Q24H  . feeding supplement  1 Container Oral TID BM  . heparin  5,000 Units Subcutaneous Q8H  . metoprolol  5 mg Intravenous Q6H  . predniSONE  5 mg Oral Daily  . sodium chloride  3 mL Intravenous Q12H  . tacrolimus  1 mg Sublingual BID    Studies/Results: No results found.    LOS: 6 days     Currie Paris, MD, [facility] Surgery, [place] [phone]   09/13/2013 8:23 AM

## 2013-09-14 LAB — CBC WITH DIFFERENTIAL/PLATELET
Basophils Relative: 0 % (ref 0–1)
Eosinophils Absolute: 0.1 10*3/uL (ref 0.0–0.7)
Eosinophils Relative: 1 % (ref 0–5)
HCT: 28.1 % — ABNORMAL LOW (ref 39.0–52.0)
Hemoglobin: 9.7 g/dL — ABNORMAL LOW (ref 13.0–17.0)
Lymphocytes Relative: 6 % — ABNORMAL LOW (ref 12–46)
MCH: 33.2 pg (ref 26.0–34.0)
MCHC: 34.5 g/dL (ref 30.0–36.0)
MCV: 96.2 fL (ref 78.0–100.0)
Monocytes Relative: 4 % (ref 3–12)
Neutro Abs: 9.2 10*3/uL — ABNORMAL HIGH (ref 1.7–7.7)
Neutrophils Relative %: 89 % — ABNORMAL HIGH (ref 43–77)
Platelets: 228 10*3/uL (ref 150–400)
RBC: 2.92 MIL/uL — ABNORMAL LOW (ref 4.22–5.81)
WBC: 10.3 10*3/uL (ref 4.0–10.5)

## 2013-09-14 LAB — COMPREHENSIVE METABOLIC PANEL
ALT: 43 U/L (ref 0–53)
CO2: 23 mEq/L (ref 19–32)
Calcium: 8 mg/dL — ABNORMAL LOW (ref 8.4–10.5)
Creatinine, Ser: 1.63 mg/dL — ABNORMAL HIGH (ref 0.50–1.35)
GFR calc Af Amer: 49 mL/min — ABNORMAL LOW (ref >90)
GFR calc non Af Amer: 42 mL/min — ABNORMAL LOW (ref >90)
Glucose, Bld: 106 mg/dL — ABNORMAL HIGH (ref 70–99)
Sodium: 135 mEq/L (ref 135–145)
Total Protein: 5.3 g/dL — ABNORMAL LOW (ref 6.0–8.3)

## 2013-09-14 LAB — POTASSIUM: Potassium: 4.1 mEq/L (ref 3.5–5.1)

## 2013-09-14 LAB — PHOSPHORUS: Phosphorus: 1.9 mg/dL — ABNORMAL LOW (ref 2.3–4.6)

## 2013-09-14 LAB — MAGNESIUM: Magnesium: 2 mg/dL (ref 1.5–2.5)

## 2013-09-14 MED ORDER — K PHOS MONO-SOD PHOS DI & MONO 155-852-130 MG PO TABS
500.0000 mg | ORAL_TABLET | Freq: Two times a day (BID) | ORAL | Status: DC
  Administered 2013-09-14 – 2013-09-15 (×3): 500 mg via ORAL
  Filled 2013-09-14 (×6): qty 2

## 2013-09-14 MED ORDER — ONDANSETRON HCL 4 MG/2ML IJ SOLN
4.0000 mg | Freq: Four times a day (QID) | INTRAMUSCULAR | Status: DC | PRN
  Administered 2013-09-14 – 2013-09-22 (×7): 4 mg via INTRAVENOUS
  Filled 2013-09-14 (×7): qty 2

## 2013-09-14 NOTE — Progress Notes (Addendum)
[facility] Progress Note   Peter Clarke WGN:[id number] DOB: 09/27/1946 DOA: 09/07/2013 PCP: [name], MD  Brief narrative: 67 year old male with remote history of cardiac transplant at [name] 11 years ago on chronic immunosuppressive therapy. 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 He also has underlying chronic kidney disease stage III. He presented to [facility] with abdominal pain associated with diarrhea but no melena or hematochezia and no nausea/vomiting. He initially presented to [facility] where CT of the abdomen and pelvis revealed abnormal thickening of the sigmoid colon with perforation. Given his underlying medical history she was subsequently transferred to [facility] for further treatment. After arrival here he has been evaluated by Dr. [name] with surgery who recommended conservative treatment with bowel rest and IV antibiotics. In review of his laboratory values he had a leukocytosis with white count of 13,000 and creatinine had increased to 2.96 from 1.57. 09/12/2013 to patient confused except for name, patient does follow commands and is not combative 09/13/2013 alert and oriented x2 ( knows name and current location), follows all commands. Patient's wife states patient complaining that food does not taste correctly and that his throat feels scratchy/raw (patient status post recent chemotherapy/XRT) TODAY alert and oriented x2 (Knows name and when), as well and out of bed into chair for approximately 2 hours today and will attempt to ambulate this evening.    Assessment/Plan:  1. Diverticulitis with perforation -care as per Gen Surgery - now s/p laparoscopic assisted Hartman's procedure (sigmoid colectomy with end colostomy) w/ lysis of adhesions x 1 hr-stable surgically-slow advance diet per [facility]-surgery has dc'd foley -Abdomen mildly tender left-sided ostomy in place right side  incision covered and clean negative sign infection, appropriately tender left upper quadrant, continue to monitor -Continue ertapenem per surgery  2. History of heart transplant/chronic immunosuppression/history of Chronic Systolic heart failure/New grade 2 DD -care as per CHF Team  -ECHO this admit showed grade 2 DD therefore watch IVF post op -Cardiology has been periodically updating transplant surgeon at [facility] (last on 9/25) -Patient's CHF stable -Counseled wife to continue to encourage patient to drink fluids; also counseled patient on the need to consume more fluids and food   3. Head and neck cancer -tx'd at [place] -Imuran dose decreased to 100 mg Daily in August when dx'd with throat cancer. -Dr. [name] also d/w pt's transplant Cardiologist at [facility] who recommended switching Prograf to 1 mg BID SL post op for better absorption until can prove tolrates solid orals easily -continue home steroid dose   4. Hypomagnesemia -No repletion needed today continue to monitor   5. Hypophosphatemia; most likely secondary to high output from ostomy, will replete  6. HTN (hypertension) -BP within AHA guidelines.    7. CKD (chronic kidney disease) stage 3, GFR 30-59 ml/min -renal function continues to improve current creatinine= 1.63 most likely increasing secondary to patient's poor by mouth intake and high ostomy output  8. Malnutrition; patient refusing to drink and eat is resulting in a malnourished state. Prealbumin/albumin both low.  - Surgery increased dextrose 5% in 1/2 NS + 20 meq daily to 175ml/hr -Will need to consider PEG tube placement if patient will not/cannot increase his caloric intake -Check potassium 1600 today -Wife to continue encouraging patient to drink Ensure as well as other fluids      DVT prophylaxis: Subcutaneous heparin Code Status: Full Family Communication: Patient and family at bedside Disposition Plan/Expected LOS:  Transfer to  floor  Consultants: Cardiology General surgery  Procedures: 2-D echocardiogram - Left ventricle: The cavity size was normal. Wall thickness was normal. Systolic function was normal. The estimated ejection fraction was in the range of 60% to 65%. Wall motion was normal; there were no regional wall motion abnormalities. Features are consistent with a pseudonormal left ventricular filling pattern, with concomitant abnormal relaxation and increased filling pressure (grade 2 diastolic dysfunction). - Aortic valve: There was no stenosis. - Mitral valve: Trivial regurgitation. - Left atrium: The atrium was mildly dilated. - Right ventricle: Poorly visualized. The cavity size was normal. Systolic function was normal. - Right atrium: The atrium was mildly dilated. - Pulmonary arteries: No complete TR doppler jet so unable to estimate PA systolic pressure. - Systemic veins: IVC was not visualized.  09/07/2013 perforated sigmoid diverticulitis; S/P Laparoscopic assisted Hartman's procedure (sigmoid colectomy with end colostomy), lysis of adhesions x 1 hr by Dr. Gaynelle Adu (surgeon)     Antibiotics: Ertapenem 9/21 >>>  Flagyl 9/21 >> DC'd 9/21 Zosyn 9/21 >> DC'd 9/21     Objective: Blood pressure 136/81, pulse 99, temperature 97.8 F (36.6 C), temperature source Oral, resp. rate 18, height 6' (1.829 m), weight 84 kg (185 lb 3 oz), SpO2 97.00%.  Intake/Output Summary (Last 24 hours) at 09/14/13 0935 Last data filed at 09/14/13 0600  Gross per 24 hour  Intake  827.5 ml  Output   1900 ml  Net -1072.5 ml   Exam: General: A./O. x2 ,No acute respiratory distress Lungs: Clear to auscultation bilaterally without wheezes or crackles Cardiovascular: Regular rate and rhythm without murmur gallop or rub, no peripheral edema or JVD Abdomen: modestly tender lower abdomen, nondistended, soft, positive bowel sounds , no ascites, no appreciable mass, ostomy bag in place on the left lower  quadrant negative leaks, right abdominal vertical incision covered and clean negative sign of infection Musculoskeletal: No significant cyanosis, clubbing of bilateral lower extremities, patient able to rise to sitting position without assistance  Scheduled Meds:  Scheduled Meds: . azaTHIOprine  100 mg Oral Daily  . ertapenem  1 g Intravenous Q24H  . feeding supplement  1 Container Oral TID BM  . heparin  5,000 Units Subcutaneous Q8H  . metoprolol  5 mg Intravenous Q6H  . predniSONE  5 mg Oral Daily  . sodium chloride  3 mL Intravenous Q12H  . tacrolimus  1 mg Sublingual BID   Data Reviewed: Basic Metabolic Panel:  Recent Labs Lab 09/10/13 0555 09/11/13 0634 09/12/13 0500 09/12/13 2340 09/13/13 0435 09/14/13 0412  NA 139 136 137  --  134* 135  K 4.1 3.9 3.9  --  3.5 3.7  CL 108 104 103  --  100 101  CO2 18* 24 25  --  24 23  GLUCOSE 82 116* 110*  --  115* 106*  BUN 34* 38* 39*  --  39* 50*  CREATININE 1.37* 1.32 1.21  --  1.22 1.63*  CALCIUM 7.6* 8.1* 8.1*  --  8.0* 8.0*  MG  --  1.0* 1.3*  --  2.3 2.0  PHOS  --   --   --  1.9* 1.5* 1.9*   Liver Function Tests:  Recent Labs Lab 09/07/13 1142 09/08/13 0340 09/13/13 0435 09/14/13 0412  AST 14 13 70* 72*  ALT 11 9 39 43  ALKPHOS 54 43 60 75  BILITOT 0.7 0.5 5.3* 4.3*  PROT 6.5 5.3* 5.3* 5.3*  ALBUMIN 3.3* 2.6* 1.9* 1.9*   CBC:  Recent Labs Lab 09/07/13 1142  09/09/13 0520 09/10/13 0555 09/11/13 0634 09/13/13 0435 09/14/13 0412  WBC 13.2*  < > 9.6 9.5 10.4 9.7 10.3  NEUTROABS 12.0*  --   --   --   --  8.6* 9.2*  HGB 11.5*  < > 10.2* 10.2* 10.0* 10.7* 9.7*  HCT 33.9*  < > 29.6* 28.4* 29.0* 30.2* 28.1*  MCV 99.7  < > 97.4 96.9 97.0 93.8 96.2  PLT 240  < > 194 204 175 222 228  < > = values in this interval not displayed.  Recent Results (from the past 240 hour(s))  CULTURE, BLOOD (ROUTINE X 2)     Status: None   Collection Time    09/07/13 11:41 AM      Result Value Range Status   Specimen  Description BLOOD LEFT ANTECUBITAL   Final   Special Requests BOTTLES DRAWN AEROBIC AND ANAEROBIC 8CC   Final   Culture NO GROWTH 5 DAYS   Final   Report Status 09/12/2013 FINAL   Final  CULTURE, BLOOD (ROUTINE X 2)     Status: None   Collection Time    09/07/13 11:43 AM      Result Value Range Status   Specimen Description BLOOD RIGHT ANTECUBITAL   Final   Special Requests BOTTLES DRAWN AEROBIC AND ANAEROBIC 12CC   Final   Culture NO GROWTH 5 DAYS   Final   Report Status 09/12/2013 FINAL   Final  MRSA PCR SCREENING     Status: None   Collection Time    09/07/13  7:23 PM      Result Value Range Status   MRSA by PCR NEGATIVE  NEGATIVE Final   Comment:            The GeneXpert MRSA Assay (FDA     approved for NASAL specimens     only), is one component of a     comprehensive MRSA colonization     surveillance program. It is not     intended to diagnose MRSA     infection nor to guide or     monitor treatment for     MRSA infections.  URINE CULTURE     Status: None   Collection Time    09/08/13 10:11 AM      Result Value Range Status   Specimen Description URINE, CLEAN CATCH   Final   Special Requests NONE   Final   Culture  Setup Time     Final   Value: 09/08/2013 13:29     Performed at [facility] Count     Final   Value: NO GROWTH     Performed at [facility]   Culture     Final   Value: NO GROWTH     Performed at [facility]   Report Status 09/09/2013 FINAL   Final     Studies:  Recent x-ray studies have been reviewed in detail by the Attending Physician   On-Call/Text Page:      [email or web address]      password [email or web address]  If 7PM-7AM, please contact night-coverage [email or web address] Password [facility] 09/14/2013, 9:35 AM   LOS: 7 days    Carolyne Littles, J,MD [phone] 09/14/2013, 9:35 AM

## 2013-09-14 NOTE — Progress Notes (Signed)
5 Days Post-Op   Assessment: s/p Procedure(s): Lap. Assisted Colectomy, lysis of adhesions, Colostomy Patient Active Problem List   Diagnosis Date Noted  . Malnutrition of moderate degree 09/12/2013  . Hypomagnesemia 09/12/2013  . Chronic diastolic heart failure 09/08/2013  . Pre-operative cardiovascular examination 09/08/2013  . Fever 09/08/2013  . Leukocytosis 09/08/2013  . CKD (chronic kidney disease) stage 3, GFR 30-59 ml/min 09/08/2013  . HTN (hypertension) 09/07/2013  . Diverticulitis with perforation 09/07/2013  . Head and neck cancer 07/26/2013  . History of heart transplant/chronic immunosupression 06/17/2013    Stable surgically Rising Creat, likely volume depletion from large colostomy output  Plan: Continue same diet till better tolerated: increase IV rate and recheck labs in am  Subjective: Feels a bit better than yesterday, still a lot of incisional pain, no nausea, taking some full liquids, but so far only a little. Notes a large amount of liquid stool  Objective: Vital signs in last 24 hours: Temp:  [97.8 F (36.6 C)-98.7 F (37.1 C)] 97.8 F (36.6 C) (09/28 0624) Pulse Rate:  [95-99] 99 (09/28 0624) Resp:  [18] 18 (09/28 0624) BP: (121-136)/(71-82) 136/81 mmHg (09/28 0624) SpO2:  [97 %-98 %] 97 % (09/28 0624)   Intake/Output from previous day: 09/27 0701 - 09/28 0700 In: 827.5 [I.V.:827.5] Out: 2200 [Urine:550; Stool:1650]  General appearance: alert, cooperative, fatigued and no distress Resp: clear to auscultation bilaterally GI: Still with incisinal tenderness, rest of abdomen is soft and not tender. ostomy is functioning OK  Incision: DDI Lab Results:   Recent Labs  09/13/13 0435 09/14/13 0412  WBC 9.7 10.3  HGB 10.7* 9.7*  HCT 30.2* 28.1*  PLT 222 228   BMET  Recent Labs  09/13/13 0435 09/14/13 0412  NA 134* 135  K 3.5 3.7  CL 100 101  CO2 24 23  GLUCOSE 115* 106*  BUN 39* 50*  CREATININE 1.22 1.63*  CALCIUM 8.0* 8.0*     [facility], Scheduled . azaTHIOprine  100 mg Oral Daily  . ertapenem  1 g Intravenous Q24H  . feeding supplement  1 Container Oral TID BM  . heparin  5,000 Units Subcutaneous Q8H  . metoprolol  5 mg Intravenous Q6H  . predniSONE  5 mg Oral Daily  . sodium chloride  3 mL Intravenous Q12H  . tacrolimus  1 mg Sublingual BID    Studies/Results: No results found.    LOS: 7 days     Currie Paris, MD, [facility], [place] [phone]   09/14/2013 9:52 AM

## 2013-09-14 NOTE — Progress Notes (Signed)
Patient requesting medication for pain and nausea.   Paged MD for nausea med.

## 2013-09-15 DIAGNOSIS — N183 Chronic kidney disease, stage 3 unspecified: Secondary | ICD-10-CM

## 2013-09-15 DIAGNOSIS — K5732 Diverticulitis of large intestine without perforation or abscess without bleeding: Secondary | ICD-10-CM

## 2013-09-15 DIAGNOSIS — Z941 Heart transplant status: Secondary | ICD-10-CM

## 2013-09-15 DIAGNOSIS — C760 Malignant neoplasm of head, face and neck: Secondary | ICD-10-CM

## 2013-09-15 LAB — CBC WITH DIFFERENTIAL/PLATELET
Eosinophils Relative: 1 % (ref 0–5)
HCT: 27.4 % — ABNORMAL LOW (ref 39.0–52.0)
Hemoglobin: 9.3 g/dL — ABNORMAL LOW (ref 13.0–17.0)
Lymphs Abs: 0.6 10*3/uL — ABNORMAL LOW (ref 0.7–4.0)
MCH: 33.5 pg (ref 26.0–34.0)
MCV: 98.6 fL (ref 78.0–100.0)
Monocytes Absolute: 0.5 10*3/uL (ref 0.1–1.0)
Monocytes Relative: 5 % (ref 3–12)
Neutro Abs: 8.4 10*3/uL — ABNORMAL HIGH (ref 1.7–7.7)
RBC: 2.78 MIL/uL — ABNORMAL LOW (ref 4.22–5.81)
RDW: 13.7 % (ref 11.5–15.5)
WBC: 9.6 10*3/uL (ref 4.0–10.5)

## 2013-09-15 LAB — COMPREHENSIVE METABOLIC PANEL
AST: 56 U/L — ABNORMAL HIGH (ref 0–37)
Albumin: 1.8 g/dL — ABNORMAL LOW (ref 3.5–5.2)
Alkaline Phosphatase: 73 U/L (ref 39–117)
BUN: 42 mg/dL — ABNORMAL HIGH (ref 6–23)
Calcium: 8.1 mg/dL — ABNORMAL LOW (ref 8.4–10.5)
Chloride: 102 mEq/L (ref 96–112)
Potassium: 4.5 mEq/L (ref 3.5–5.1)
Total Bilirubin: 3.1 mg/dL — ABNORMAL HIGH (ref 0.3–1.2)
Total Protein: 5.6 g/dL — ABNORMAL LOW (ref 6.0–8.3)

## 2013-09-15 LAB — PHOSPHORUS: Phosphorus: 2.9 mg/dL (ref 2.3–4.6)

## 2013-09-15 LAB — TACROLIMUS LEVEL: Tacrolimus (FK506) - LabCorp: 2.5 ng/mL

## 2013-09-15 MED ORDER — ADULT MULTIVITAMIN W/MINERALS CH
1.0000 | ORAL_TABLET | Freq: Every day | ORAL | Status: DC
  Administered 2013-09-15 – 2013-09-23 (×7): 1 via ORAL
  Filled 2013-09-15 (×9): qty 1

## 2013-09-15 MED ORDER — ENSURE COMPLETE PO LIQD
237.0000 mL | Freq: Three times a day (TID) | ORAL | Status: DC
  Administered 2013-09-16 – 2013-09-23 (×15): 237 mL via ORAL

## 2013-09-15 MED ORDER — TACROLIMUS 1 MG PO CAPS
2.0000 mg | ORAL_CAPSULE | Freq: Two times a day (BID) | ORAL | Status: DC
  Administered 2013-09-15 – 2013-09-19 (×9): 2 mg via SUBLINGUAL
  Administered 2013-09-20: 1 mg via SUBLINGUAL
  Administered 2013-09-20 – 2013-09-23 (×5): 2 mg via SUBLINGUAL
  Filled 2013-09-15 (×17): qty 2

## 2013-09-15 NOTE — Progress Notes (Signed)
Peter Clarke has no skilled bed offers at this time. We sent out a new inquiry with updated insurance information. We hope to have a disposition soon.   Gretta Cool, LCSW  Assistant Director  Clinical Social Work

## 2013-09-15 NOTE — Progress Notes (Signed)
Poor appetite. Incision clean Ostomy pink; greenish output  Will hopefully improve appetite. PT  Peter Clarke. Corliss Skains, MD, [facility] Surgery  General/ Trauma Surgery  09/15/2013 3:09 PM

## 2013-09-15 NOTE — Progress Notes (Signed)
[facility] Progress Note   Peter Clarke:[id number] DOB: Apr 08, 1946 DOA: 09/07/2013 PCP: Peter Punt, MD  Brief narrative: 67 year old male with remote history of cardiac transplant at [name] 11 years ago on chronic immunosuppressive therapy. 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 He also has underlying chronic kidney disease stage III. He presented to [facility] with abdominal pain associated with diarrhea but no melena or hematochezia and no nausea/vomiting. He initially presented to [facility] where CT of the abdomen and pelvis revealed abnormal thickening of the sigmoid colon with perforation. Given his underlying medical history she was subsequently transferred to [facility] for further treatment. After arrival here he has been evaluated by Dr. Janee Clarke with surgery who recommended conservative treatment with bowel rest and IV antibiotics. In review of his laboratory values he had a leukocytosis with white count of 13,000 and creatinine had increased to 2.96 from 1.57. 09/12/2013 to patient confused except for name, patient does follow commands and is not combative 09/13/2013 alert and oriented x2 ( knows name and current location), follows all commands. Patient's wife states patient complaining that food does not taste correctly and that his throat feels scratchy/raw (patient status post recent chemotherapy/XRT) 09/14/2013 alert and oriented x2 (Knows name and when), as well and out of bed into chair for approximately 2 hours today and will attempt to ambulate this evening. TODAY states drink Ensure for breakfast and became nauseous but did not throw throw up; still having poor consumption of fluids/food. States hasn't been up and walking around today    Assessment/Plan:  1. Diverticulitis with perforation -care as per Gen Surgery - now s/p laparoscopic assisted Hartman's procedure (sigmoid  colectomy with end colostomy) w/ lysis of adhesions x 1 hr-stable surgically-slow advance diet per [facility]-surgery has dc'd foley -Abdomen mildly tender left-sided ostomy in place right side incision covered and clean negative sign infection;  Mildly tenderRUQ/RLQ to palpation. Continue to monitor -Continue ertapenem per surgery   2. History of heart transplant/chronic immunosuppression/history of Chronic Systolic heart failure/New grade 2 DD -care as per CHF Team  -ECHO this admit showed grade 2 DD therefore watch IVF post op -Cardiology has been periodically updating transplant surgeon at [facility] (last on 9/25) -Patient's CHF stable -Counseled patient to continue to drink fluids/consume food;   3. Head and neck cancer -tx'd at [place] -Imuran dose decreased to 100 mg Daily in August when dx'd with throat cancer. -Dr. Gala Clarke also d/w pt's transplant Cardiologist at [facility] who recommended switching Prograf to 1 mg BID SL post op for better absorption until can prove tolrates solid orals easily -continue home steroid dose   4. Hypomagnesemia -No repletion needed today continue to monitor   5. Hypophosphatemia; within normal limits, no additional magnesium required today continue to monitor;  -Loss most likely secondary to high output from ostomy  6. HTN (hypertension) -BP within AHA guidelines.    7. CKD (chronic kidney disease) stage 3, GFR 30-59 ml/min -renal function continues to improve current creatinine= 1.43 -Continuedextrose 5% in 1/2 NS + 20 meq daily to 128ml/hr  8. Malnutrition; patient attempting to eat and drink having some nausea   - Continue dextrose 5% in 1/2 NS + 20 meq daily to 132ml/hr -If patient's consumption of fluids/food still not adequate by Wednesday or Thursday Will need to discuss with surgery possible placement PEG tube to increase his caloric intake       DVT prophylaxis: Subcutaneous  heparin + SCD Code Status: Full Family Communication: Patient and  family at bedside Disposition Plan/Expected LOS: Transfer to floor  Consultants: Cardiology General surgery  Procedures: 2-D echocardiogram - Left ventricle: The cavity size was normal. Wall thickness was normal. Systolic function was normal. The estimated ejection fraction was in the range of 60% to 65%. Wall motion was normal; there were no regional wall motion abnormalities. Features are consistent with a pseudonormal left ventricular filling pattern, with concomitant abnormal relaxation and increased filling pressure (grade 2 diastolic dysfunction). - Aortic valve: There was no stenosis. - Mitral valve: Trivial regurgitation. - Left atrium: The atrium was mildly dilated. - Right ventricle: Poorly visualized. The cavity size was normal. Systolic function was normal. - Right atrium: The atrium was mildly dilated. - Pulmonary arteries: No complete TR doppler jet so unable to estimate PA systolic pressure. - Systemic veins: IVC was not visualized.  09/07/2013 perforated sigmoid diverticulitis; S/P Laparoscopic assisted Hartman's procedure (sigmoid colectomy with end colostomy), lysis of adhesions x 1 hr by Dr. Gaynelle Adu (surgeon)     Antibiotics: Ertapenem 9/21 >>>  Flagyl 9/21 >> DC'd 9/21 Zosyn 9/21 >> DC'd 9/21     Objective: Blood pressure 129/77, pulse 98, temperature 99.5 F (37.5 C), temperature source Oral, resp. rate 18, height 6' (1.829 m), weight 84 kg (185 lb 3 oz), SpO2 98.00%.  Intake/Output Summary (Last 24 hours) at 09/15/13 1110 Last data filed at 09/15/13 0900  Gross per 24 hour  Intake   3212 ml  Output   2700 ml  Net    512 ml   Exam: General: A./O. x4 ,No acute respiratory distress Lungs: Clear to auscultation bilaterally without wheezes or crackles Cardiovascular: Regular rate and rhythm without murmur gallop or rub, no peripheral edema or JVD Abdomen: modestly tender RUG/RLQ, nondistended, soft, positive bowel sounds , no ascites, no  appreciable mass, ostomy bag in place on the left lower quadrant negative leaks, right abdominal vertical incision covered and clean negative sign of infection Musculoskeletal: No significant cyanosis, clubbing of bilateral lower extremities, patient able to rise to sitting position without assistance  Scheduled Meds:  Scheduled Meds: . azaTHIOprine  100 mg Oral Daily  . ertapenem  1 g Intravenous Q24H  . feeding supplement  237 mL Oral TID BM  . heparin  5,000 Units Subcutaneous Q8H  . metoprolol  5 mg Intravenous Q6H  . phosphorus  500 mg Oral BID  . predniSONE  5 mg Oral Daily  . sodium chloride  3 mL Intravenous Q12H  . tacrolimus  1 mg Sublingual BID   Data Reviewed: Basic Metabolic Panel:  Recent Labs Lab 09/11/13 0634 09/12/13 0500 09/12/13 2340 09/13/13 0435 09/14/13 0412 09/14/13 1540 09/15/13 0537  NA 136 137  --  134* 135  --  134*  K 3.9 3.9  --  3.5 3.7 4.1 4.5  CL 104 103  --  100 101  --  102  CO2 24 25  --  24 23  --  20  GLUCOSE 116* 110*  --  115* 106*  --  127*  BUN 38* 39*  --  39* 50*  --  42*  CREATININE 1.32 1.21  --  1.22 1.63*  --  1.43*  CALCIUM 8.1* 8.1*  --  8.0* 8.0*  --  8.1*  MG 1.0* 1.3*  --  2.3 2.0  --  1.7  PHOS  --   --  1.9* 1.5* 1.9*  --  2.9   Liver  Function Tests:  Recent Labs Lab 09/13/13 0435 09/14/13 0412 09/15/13 0537  AST 70* 72* 56*  ALT 39 43 40  ALKPHOS 60 75 73  BILITOT 5.3* 4.3* 3.1*  PROT 5.3* 5.3* 5.6*  ALBUMIN 1.9* 1.9* 1.8*   CBC:  Recent Labs Lab 09/10/13 0555 09/11/13 0634 09/13/13 0435 09/14/13 0412 09/15/13 0537  WBC 9.5 10.4 9.7 10.3 9.6  NEUTROABS  --   --  8.6* 9.2* 8.4*  HGB 10.2* 10.0* 10.7* 9.7* 9.3*  HCT 28.4* 29.0* 30.2* 28.1* 27.4*  MCV 96.9 97.0 93.8 96.2 98.6  PLT 204 175 222 228 269    Recent Results (from the past 240 hour(s))  CULTURE, BLOOD (ROUTINE X 2)     Status: None   Collection Time    09/07/13 11:41 AM      Result Value Range Status   Specimen Description BLOOD  LEFT ANTECUBITAL   Final   Special Requests BOTTLES DRAWN AEROBIC AND ANAEROBIC 8CC   Final   Culture NO GROWTH 5 DAYS   Final   Report Status 09/12/2013 FINAL   Final  CULTURE, BLOOD (ROUTINE X 2)     Status: None   Collection Time    09/07/13 11:43 AM      Result Value Range Status   Specimen Description BLOOD RIGHT ANTECUBITAL   Final   Special Requests BOTTLES DRAWN AEROBIC AND ANAEROBIC 12CC   Final   Culture NO GROWTH 5 DAYS   Final   Report Status 09/12/2013 FINAL   Final  MRSA PCR SCREENING     Status: None   Collection Time    09/07/13  7:23 PM      Result Value Range Status   MRSA by PCR NEGATIVE  NEGATIVE Final   Comment:            The GeneXpert MRSA Assay (FDA     approved for NASAL specimens     only), is one component of a     comprehensive MRSA colonization     surveillance program. It is not     intended to diagnose MRSA     infection nor to guide or     monitor treatment for     MRSA infections.  URINE CULTURE     Status: None   Collection Time    09/08/13 10:11 AM      Result Value Range Status   Specimen Description URINE, CLEAN CATCH   Final   Special Requests NONE   Final   Culture  Setup Time     Final   Value: 09/08/2013 13:29     Performed at [facility] Count     Final   Value: NO GROWTH     Performed at [facility]   Culture     Final   Value: NO GROWTH     Performed at [facility]   Report Status 09/09/2013 FINAL   Final     Studies:  Recent x-ray studies have been reviewed in detail by the Attending Physician   On-Call/Text Page:      [email or web address]      password [email or web address]  If 7PM-7AM, please contact night-coverage [email or web address] Password [email or web address] 09/15/2013, 11:10 AM   LOS: 8 days    Carolyne Littles, J,MD [phone] 09/15/2013, 11:10 AM

## 2013-09-15 NOTE — Progress Notes (Signed)
   Remains on sl tacrolimus until oral intake improves then switch back to po. We have ordered tacrolimus level today. We will check in on him later.   Peter Katich,MD 8:05 AM

## 2013-09-15 NOTE — Progress Notes (Signed)
Patient ID: Peter Clarke, male   DOB: 1946/05/05, 67 y.o.   MRN: [id number] 6 Days Post-Op  Subjective: Pt feels ok today.  Feels like his appetite is a little better today.  Had minimal appetite before surgery as well.    Objective: Vital signs in last 24 hours: Temp:  [98.9 F (37.2 C)-100.3 F (37.9 C)] 99.5 F (37.5 C) (09/29 0619) Pulse Rate:  [93-100] 98 (09/29 0619) Resp:  [16-18] 18 (09/29 0619) BP: (113-140)/(69-85) 129/77 mmHg (09/29 0619) SpO2:  [93 %-99 %] 98 % (09/29 0619) Last BM Date: 09/13/13  Intake/Output from previous day: 09/28 0701 - 09/29 0700 In: 3152 [P.O.:240; I.V.:2912] Out: 2300 [Urine:600; Stool:1700] Intake/Output this shift:    PE: Abd: soft, appropriately tender, wound is clean and packed.  Ostomy in place with bag just recently emptied.  Ostomy is pink and viable.   Lab Results:   Recent Labs  09/14/13 0412 09/15/13 0537  WBC 10.3 9.6  HGB 9.7* 9.3*  HCT 28.1* 27.4*  PLT 228 269   BMET  Recent Labs  09/14/13 0412 09/14/13 1540 09/15/13 0537  NA 135  --  134*  K 3.7 4.1 4.5  CL 101  --  102  CO2 23  --  20  GLUCOSE 106*  --  127*  BUN 50*  --  42*  CREATININE 1.63*  --  1.43*  CALCIUM 8.0*  --  8.1*   PT/INR No results found for this basename: LABPROT, INR,  in the last 72 hours CMP     Component Value Date/Time   NA 134* 09/15/2013 0537   K 4.5 09/15/2013 0537   CL 102 09/15/2013 0537   CO2 20 09/15/2013 0537   GLUCOSE 127* 09/15/2013 0537   BUN 42* 09/15/2013 0537   CREATININE 1.43* 09/15/2013 0537   CREATININE 1.99* 06/14/2013 0825   CALCIUM 8.1* 09/15/2013 0537   PROT 5.6* 09/15/2013 0537   ALBUMIN 1.8* 09/15/2013 0537   AST 56* 09/15/2013 0537   ALT 40 09/15/2013 0537   ALKPHOS 73 09/15/2013 0537   BILITOT 3.1* 09/15/2013 0537   GFRNONAA 49* 09/15/2013 0537   GFRAA 57* 09/15/2013 0537   Lipase  No results found for this basename: lipase       Studies/Results: No results found.  Anti-infectives: Anti-infectives    Start     Dose/Rate Route Frequency Ordered Stop   09/07/13 2000  ertapenem (INVANZ) 1 g in sodium chloride 0.9 % 50 mL IVPB     1 g 100 mL/hr over 30 Minutes Intravenous Every 24 hours 09/07/13 1914     09/07/13 1430  piperacillin-tazobactam (ZOSYN) IVPB 3.375 g     3.375 g 12.5 mL/hr over 240 Minutes Intravenous  Once 09/07/13 1425 09/07/13 1516   09/07/13 1430  metroNIDAZOLE (FLAGYL) IVPB 500 mg     500 mg 100 mL/hr over 60 Minutes Intravenous  Once 09/07/13 1425 09/07/13 1617       Assessment/Plan  1. S/p Hartman's for perf diverticulitis 2. S/p Heart transplant 3.  Head and neck cancer 4. CHF  Plan: 1. Patient has a calorie count pending.  Will add ensure TID 2. He has an increased appetite today.  Will advance to dysphagia 3 diet and he may have food from home.  He has no particular diet restrictions at this point except for his ability to swallow. 3. Cont to work with PT as patient is quite deconditioned 4. Follow ostomy output.  We typically don't like to place  people on imodium or other slowing agents this soon after surgery, but appears increase in Cr may be partially from high ostomy output.  Usually this resolve with more solid food and the fact that this is a colostomy and not an ileostomy.  Will follow in case we need to add something.  LOS: 8 days    Khyli Swaim E 09/15/2013, 10:29 AM Pager: [phone]

## 2013-09-15 NOTE — Progress Notes (Signed)
PT Cancellation Note  Patient Details Name: Peter Clarke MRN: [id number] DOB: 03-20-1946   Cancelled Treatment:    Reason Eval/Treat Not Completed: Fatigue/lethargy limiting ability to participate Pt seen ambulating with nsg tech short distance this morning, so returned this afternoon to attempt therapy session however pt declines stating too fatigued and would like to sleep.    Cortina Vultaggio,KATHrine E 09/15/2013, 2:40 PM Zenovia Jarred, PT, DPT 09/15/2013 Pager: [phone]

## 2013-09-15 NOTE — Progress Notes (Signed)
NUTRITION FOLLOW UP  Intervention:   Continue Ensure Complete TID Encourage PO intake Provide Multivitamin with minerals Recommend appetite stimulant Pt may benefit from mouth wash to help taste changes  Nutrition Dx:   Inadequate oral intake related to lack of appetite as evidenced by pt not eating; ongoing  Goal:   Pt to meet >/= 90% of their estimated nutrition needs; not met  Monitor:   PO intake; very poor Diet advancement; dysphagia 3 Weight; no new wt Labs; low sodium, high BUN, low calcium, low albumin, low hemoglobin, low GFR   Assessment:   Pt states he still has no appetite and that food has no taste/doesn't taste good. Pt reports drinking 2-3 Ensure supplements per day but, family in the room state that pt is drinking less than one supplement daily.   Calorie Count 48 hour calorie count ordered.  Diet: Clear Liquid/Full Liquid/ Dysphagia 3 Supplements: Ensure Complete BID  48 hour total Total intake: 453 kcal (11% of minimum estimated needs daily)  14 protein (7% of minimum estimated needs daily)   Height: Ht Readings from Last 1 Encounters:  09/11/13 6' (1.829 m)    Weight Status:   Wt Readings from Last 1 Encounters:  09/11/13 185 lb 3 oz (84 kg)    Re-estimated needs:  Kcal: 2000-2200  Protein: 100-110 grams  Fluid: >/=2.2 L/day  Skin: non-pitting RLE and LLE edema; abdominal incision  Diet Order: Dysphagia   Intake/Output Summary (Last 24 hours) at 09/15/13 1403 Last data filed at 09/15/13 1300  Gross per 24 hour  Intake   3272 ml  Output   2850 ml  Net    422 ml    Last BM: 9/27   Labs:   Recent Labs Lab 09/13/13 0435 09/14/13 0412 09/14/13 1540 09/15/13 0537  NA 134* 135  --  134*  K 3.5 3.7 4.1 4.5  CL 100 101  --  102  CO2 24 23  --  20  BUN 39* 50*  --  42*  CREATININE 1.22 1.63*  --  1.43*  CALCIUM 8.0* 8.0*  --  8.1*  MG 2.3 2.0  --  1.7  PHOS 1.5* 1.9*  --  2.9  GLUCOSE 115* 106*  --  127*    CBG (last 3)   No results found for this basename: GLUCAP,  in the last 72 hours  Scheduled Meds: . azaTHIOprine  100 mg Oral Daily  . ertapenem  1 g Intravenous Q24H  . feeding supplement  237 mL Oral TID BM  . heparin  5,000 Units Subcutaneous Q8H  . metoprolol  5 mg Intravenous Q6H  . phosphorus  500 mg Oral BID  . predniSONE  5 mg Oral Daily  . sodium chloride  3 mL Intravenous Q12H  . tacrolimus  1 mg Sublingual BID    Continuous Infusions: . dextrose 5 % and 0.45 % NaCl with KCl 20 mEq/L 150 mL/hr at 09/15/13 1201    Ian Malkin RD, LDN Inpatient Clinical Dietitian Pager: [phone] After Hours Pager: [phone]

## 2013-09-15 NOTE — Progress Notes (Addendum)
Peter Clarke is 67yom with Hx heart transplant 18yr ago.  He is s/p surgery for diverticulitis with focal perforation.  We have been monitoring his antirejection medications around surgery.  He has continued on azathioprine 100mg  daily and prednisone 5mg  daily.  He was previously taking tacrolimus 2mg  po bid prior to surgery with a trough level 6 and 5.5  (at goal 5-15).  Post op he was NPO and his tacrolimus dose was changed to 1mg  SL bid, as the SL administration is supposed to be better absorbed and the dose should empirically be cut in half.  He has been receiving tacrolimus 1mg  SL bid since 9/24 and the trough level has fallen to 2.5 on 9/29.  I discussed proper administration of dose with pt and RN.   Plan Increase Tacrolimus 2mg  SL BID Will recheck trough level later this week.  Peter Clarke Pharm.D. CPP, BCPS Clinical Pharmacist [phone] 09/15/2013 3:55 PM   Po intake remains poor. Tacrolimus level is low. Agree with increasing sublingual tacrolimus to 2 bid. Once taking po can switch back to oral tacrolimus dosing. Discussed with transplant team at [facility].   Peter Bensimhon,MD 10:37 AM

## 2013-09-16 DIAGNOSIS — Z941 Heart transplant status: Secondary | ICD-10-CM

## 2013-09-16 DIAGNOSIS — N183 Chronic kidney disease, stage 3 unspecified: Secondary | ICD-10-CM

## 2013-09-16 DIAGNOSIS — K5732 Diverticulitis of large intestine without perforation or abscess without bleeding: Secondary | ICD-10-CM

## 2013-09-16 DIAGNOSIS — C760 Malignant neoplasm of head, face and neck: Secondary | ICD-10-CM

## 2013-09-16 DIAGNOSIS — Z515 Encounter for palliative care: Secondary | ICD-10-CM

## 2013-09-16 LAB — MAGNESIUM: Magnesium: 1.4 mg/dL — ABNORMAL LOW (ref 1.5–2.5)

## 2013-09-16 LAB — CBC WITH DIFFERENTIAL/PLATELET
Basophils Absolute: 0 10*3/uL (ref 0.0–0.1)
Basophils Relative: 0 % (ref 0–1)
Eosinophils Absolute: 0.1 10*3/uL (ref 0.0–0.7)
Eosinophils Relative: 1 % (ref 0–5)
Hemoglobin: 8.7 g/dL — ABNORMAL LOW (ref 13.0–17.0)
MCH: 33.6 pg (ref 26.0–34.0)
MCHC: 34.5 g/dL (ref 30.0–36.0)
MCV: 97.3 fL (ref 78.0–100.0)
Monocytes Relative: 9 % (ref 3–12)
Neutro Abs: 8.3 10*3/uL — ABNORMAL HIGH (ref 1.7–7.7)
Neutrophils Relative %: 87 % — ABNORMAL HIGH (ref 43–77)
Platelets: 315 10*3/uL (ref 150–400)
RDW: 13.9 % (ref 11.5–15.5)

## 2013-09-16 LAB — COMPREHENSIVE METABOLIC PANEL
ALT: 48 U/L (ref 0–53)
AST: 74 U/L — ABNORMAL HIGH (ref 0–37)
Alkaline Phosphatase: 81 U/L (ref 39–117)
BUN: 31 mg/dL — ABNORMAL HIGH (ref 6–23)
CO2: 20 mEq/L (ref 19–32)
Chloride: 105 mEq/L (ref 96–112)
GFR calc non Af Amer: 46 mL/min — ABNORMAL LOW (ref >90)
Potassium: 5.4 mEq/L — ABNORMAL HIGH (ref 3.5–5.1)
Sodium: 135 mEq/L (ref 135–145)
Total Bilirubin: 2.3 mg/dL — ABNORMAL HIGH (ref 0.3–1.2)

## 2013-09-16 LAB — PHOSPHORUS: Phosphorus: 2.8 mg/dL (ref 2.3–4.6)

## 2013-09-16 MED ORDER — LOPERAMIDE HCL 2 MG PO CAPS
2.0000 mg | ORAL_CAPSULE | Freq: Four times a day (QID) | ORAL | Status: DC
  Administered 2013-09-16 – 2013-09-23 (×24): 2 mg via ORAL
  Filled 2013-09-16 (×33): qty 1

## 2013-09-16 MED ORDER — K PHOS MONO-SOD PHOS DI & MONO 155-852-130 MG PO TABS
250.0000 mg | ORAL_TABLET | Freq: Every day | ORAL | Status: DC
  Filled 2013-09-16 (×2): qty 1

## 2013-09-16 MED ORDER — DEXTROSE-NACL 5-0.45 % IV SOLN
INTRAVENOUS | Status: DC
  Administered 2013-09-16 – 2013-09-18 (×6): via INTRAVENOUS

## 2013-09-16 MED ORDER — MAGNESIUM SULFATE 40 MG/ML IJ SOLN
2.0000 g | Freq: Once | INTRAMUSCULAR | Status: AC
  Administered 2013-09-16: 2 g via INTRAVENOUS
  Filled 2013-09-16: qty 50

## 2013-09-16 NOTE — Progress Notes (Signed)
Patient seems in poor spirits, no appetite Large ostomy output - will try some imodium Palliative care consult pending  Wilmon Arms. Corliss Skains, MD, [facility]  General/ Trauma Surgery  09/16/2013 1:44 PM

## 2013-09-16 NOTE — Progress Notes (Signed)
Patient ID: Peter Clarke, male   DOB: February 13, 1946, 67 y.o.   MRN: [id number] 7 Days Post-Op  Subjective: Pt tired today.  No change in appetite.  Really has no desire to eat.  Objective: Vital signs in last 24 hours: Temp:  [98.1 F (36.7 C)-100 F (37.8 C)] 98.1 F (36.7 C) (09/30 0520) Pulse Rate:  [93-103] 98 (09/30 0520) Resp:  [17-20] 17 (09/30 0520) BP: (124-134)/(69-78) 124/74 mmHg (09/30 0520) SpO2:  [94 %-96 %] 95 % (09/30 0520) Last BM Date: 09/15/13  Intake/Output from previous day: 09/29 0701 - 09/30 0700 In: 1458 [P.O.:180; I.V.:1278] Out: 3600 [Urine:1250; Stool:2350] Intake/Output this shift: Total I/O In: -  Out: 325 [Stool:325]  PE: Abd: soft, wound is clean and stable.  Ostomy with liquid bilious output.  Stoma is pink and viable. Heart: regular, occasional PVC  Lab Results:   Recent Labs  09/15/13 0537 09/16/13 0720  WBC 9.6 9.6  HGB 9.3* 8.7*  HCT 27.4* 25.2*  PLT 269 315   BMET  Recent Labs  09/15/13 0537 09/16/13 0720  NA 134* 135  K 4.5 5.4*  CL 102 105  CO2 20 20  GLUCOSE 127* 126*  BUN 42* 31*  CREATININE 1.43* 1.50*  CALCIUM 8.1* 8.5   PT/INR No results found for this basename: LABPROT, INR,  in the last 72 hours CMP     Component Value Date/Time   NA 135 09/16/2013 0720   K 5.4* 09/16/2013 0720   CL 105 09/16/2013 0720   CO2 20 09/16/2013 0720   GLUCOSE 126* 09/16/2013 0720   BUN 31* 09/16/2013 0720   CREATININE 1.50* 09/16/2013 0720   CREATININE 1.99* 06/14/2013 0825   CALCIUM 8.5 09/16/2013 0720   PROT 5.7* 09/16/2013 0720   ALBUMIN 1.9* 09/16/2013 0720   AST 74* 09/16/2013 0720   ALT 48 09/16/2013 0720   ALKPHOS 81 09/16/2013 0720   BILITOT 2.3* 09/16/2013 0720   GFRNONAA 46* 09/16/2013 0720   GFRAA 54* 09/16/2013 0720   Lipase  No results found for this basename: lipase       Studies/Results: No results found.  Anti-infectives: Anti-infectives   Start     Dose/Rate Route Frequency Ordered Stop   09/07/13 2000   ertapenem (INVANZ) 1 g in sodium chloride 0.9 % 50 mL IVPB     1 g 100 mL/hr over 30 Minutes Intravenous Every 24 hours 09/07/13 1914     09/07/13 1430  piperacillin-tazobactam (ZOSYN) IVPB 3.375 g     3.375 g 12.5 mL/hr over 240 Minutes Intravenous  Once 09/07/13 1425 09/07/13 1516   09/07/13 1430  metroNIDAZOLE (FLAGYL) IVPB 500 mg     500 mg 100 mL/hr over 60 Minutes Intravenous  Once 09/07/13 1425 09/07/13 1617       Assessment/Plan  1. Complicated diverticulitis, s/p Hartman's procedure 2. Head and Neck Ca 3. Decreased appetite/PCM 4. S/p heart transplant  Plan: 1.cont TID ensure for now; however, the patient is really not even drinking those.  Calorie count reveals very poor intake. 2. I had a very long discussion today with the patient and his wife about options given his inability to take in the calories he needs. 1. These are: short-term use of TNA to get him over his operation to see if his appetite returns in about 6 weeks or so, 2. D/w surgeon to see IF having GI or IR eval the patient for a PEG tube is an option for possible tube feeds, or 3. Do nothing,  as far as letting the patient eat on his own and understanding that he will likely not get enough nutrition and be aware of the result of that.  The patient expresses to me a desire more for the 3rd option, but I do not think his wife is there yet.  Agree that a palliative care consult is a great idea for this patient and his wife. 3. Will discuss high output with MD and see what we can do about this. 4. Cont IVFs while having such high ostomy output. 5. Routine wound and ostomy care.  LOS: 9 days    Tyshawna Alarid E 09/16/2013, 10:45 AM Pager: [phone]

## 2013-09-16 NOTE — Progress Notes (Signed)
[facility] Progress Note   Peter Clarke WGN:[id number] DOB: Apr 22, 1946 DOA: 09/07/2013 PCP: [name], MD  Brief narrative: 67 year old male with remote history of cardiac transplant at [name] 11 years ago on chronic immunosuppressive therapy. 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 He also has underlying chronic kidney disease stage III. He presented to [facility] with abdominal pain associated with diarrhea but no melena or hematochezia and no nausea/vomiting. He initially presented to [facility] where CT of the abdomen and pelvis revealed abnormal thickening of the sigmoid colon with perforation. Given his underlying medical history she was subsequently transferred to [facility] for further treatment. After arrival here he has been evaluated by Dr. [name] with surgery who recommended conservative treatment with bowel rest and IV antibiotics. In review of his laboratory values he had a leukocytosis with white count of 13,000 and creatinine had increased to 2.96 from 1.57. 09/12/2013 to patient confused except for name, patient does follow commands and is not combative 09/13/2013 alert and oriented x2 ( knows name and current location), follows all commands. Patient's wife states patient complaining that food does not taste correctly and that his throat feels scratchy/raw (patient status post recent chemotherapy/XRT) 09/14/2013 alert and oriented x2 (Knows name and when), as well and out of bed into chair for approximately 2 hours today and will attempt to ambulate this evening. 09/15/2013 states drink Ensure for breakfast and became nauseous but did not throw throw up; still having poor consumption of fluids/food. States hasn't been up and walking around today TODAY patient expressing that he does not believe that he wishes to continue with the aggressive care is wondering what his options might be, wife  and patient haven't discussed down the back of the medical care. Patient sitting comfortably in chair   Assessment/Plan:  1. Diverticulitis with perforation -care as per Gen Surgery - now s/p laparoscopic assisted Hartman's procedure (sigmoid colectomy with end colostomy) w/ lysis of adhesions x 1 hr-stable surgically-slow advance diet per [facility]-surgery has dc'd foley -Abdomen mildly tender left-sided ostomy in place right side incision covered and clean negative sign infection;  moderately  tenderRUQ/RLQ to palpation (patient had been up with physical therapy). Continue to monitor -Continue ertapenem per surgery   2. History of heart transplant/chronic immunosuppression/history of Chronic Systolic heart failure/New grade 2 DD -care as per CHF Team  -ECHO this admit showed grade 2 DD therefore watch IVF post op -Cardiology has been periodically updating transplant surgeon at [facility] (last on 9/25) -Patient's CHF stable -Counseled patient to continue to drink fluids/consume food;   3. Head and neck cancer -tx'd at [place] -Imuran dose decreased to 100 mg Daily in August when dx'd with throat cancer. -Dr. [name] also d/w pt's transplant Cardiologist at [facility] who recommended switching Prograf to 1 mg BID SL post op for better absorption until can prove tolrates solid orals easily -continue home steroid dose   4. Hypomagnesemia -Trending down, will replete    5. Hypophosphatemia; stable however patient's potassium is trending up with the addition of K-Phos will decrease K-Phos to 250 mg daily -Loss most likely secondary to high output from ostomy and patient's poor by mouth intake  6. HTN (hypertension) -BP within AHA guidelines.    7. CKD (chronic kidney disease) stage 3, GFR 30-59 ml/min -renal function continues to improve current creatinine= 1.43 -Continuedextrose 5% in 1/2 NS + 20 meq daily to 144ml/hr  8. Malnutrition; patient  attempting to eat and drink having some nausea   -  Continue dextrose 5% in 1/2 NS + 20 meq daily to 145ml/hr -Patient's consumption of fluids/food still not adequate. However patient expressing interest in discontinuing aggressive medical care. -Hold off further aggressive care until patient's family have made a decision with palliative care on short-term and long-term care goals (Will need to discuss with surgery possible placement PEG tube to increase his caloric intake) -Palliative care consult placed       DVT prophylaxis: Subcutaneous heparin + SCD Code Status: Full Family Communication: Patient and family at bedside Disposition Plan/Expected LOS: Transfer to floor  Consultants: Cardiology General surgery  Procedures: 2-D echocardiogram - Left ventricle: The cavity size was normal. Wall thickness was normal. Systolic function was normal. The estimated ejection fraction was in the range of 60% to 65%. Wall motion was normal; there were no regional wall motion abnormalities. Features are consistent with a pseudonormal left ventricular filling pattern, with concomitant abnormal relaxation and increased filling pressure (grade 2 diastolic dysfunction). - Aortic valve: There was no stenosis. - Mitral valve: Trivial regurgitation. - Left atrium: The atrium was mildly dilated. - Right ventricle: Poorly visualized. The cavity size was normal. Systolic function was normal. - Right atrium: The atrium was mildly dilated. - Pulmonary arteries: No complete TR doppler jet so unable to estimate PA systolic pressure. - Systemic veins: IVC was not visualized.  09/07/2013 perforated sigmoid diverticulitis; S/P Laparoscopic assisted Hartman's procedure (sigmoid colectomy with end colostomy), lysis of adhesions x 1 hr by Dr. Gaynelle Adu (surgeon)     Antibiotics: Ertapenem 9/21 >>>  Flagyl 9/21 >> DC'd 9/21 Zosyn 9/21 >> DC'd 9/21     Objective: Blood pressure 124/74, pulse 98, temperature 98.1 F (36.7 C), temperature source Oral,  resp. rate 17, height 6' (1.829 m), weight 84 kg (185 lb 3 oz), SpO2 95.00%.  Intake/Output Summary (Last 24 hours) at 09/16/13 1501 Last data filed at 09/16/13 1100  Gross per 24 hour  Intake     63 ml  Output   3325 ml  Net  -3262 ml   Exam: General: A./O. x4 ,No acute respiratory distress Lungs: Clear to auscultation bilaterally without wheezes or crackles Cardiovascular: Regular rate and rhythm without murmur gallop or rub, no peripheral edema or JVD Abdomen: modestly tender RUG/RLQ, nondistended, soft, positive bowel sounds , no ascites, no appreciable mass, ostomy bag in place on the left lower quadrant negative leaks, right abdominal vertical incision covered and clean negative sign of infection Musculoskeletal: No significant cyanosis, clubbing of bilateral lower extremities, patient able to rise to sitting position without assistance  Scheduled Meds:  Scheduled Meds: . azaTHIOprine  100 mg Oral Daily  . ertapenem  1 g Intravenous Q24H  . feeding supplement  237 mL Oral TID BM  . heparin  5,000 Units Subcutaneous Q8H  . loperamide  2 mg Oral QID  . metoprolol  5 mg Intravenous Q6H  . multivitamin with minerals  1 tablet Oral Daily  . [START ON 09/17/2013] phosphorus  250 mg Oral Daily  . predniSONE  5 mg Oral Daily  . sodium chloride  3 mL Intravenous Q12H  . tacrolimus  2 mg Sublingual BID   Data Reviewed: Basic Metabolic Panel:  Recent Labs Lab 09/12/13 0500 09/12/13 2340 09/13/13 0435 09/14/13 0412 09/14/13 1540 09/15/13 0537 09/16/13 0720  NA 137  --  134* 135  --  134* 135  K 3.9  --  3.5 3.7 4.1 4.5 5.4*  CL 103  --  100 101  --  102 105  CO2 25  --  24 23  --  20 20  GLUCOSE 110*  --  115* 106*  --  127* 126*  BUN 39*  --  39* 50*  --  42* 31*  CREATININE 1.21  --  1.22 1.63*  --  1.43* 1.50*  CALCIUM 8.1*  --  8.0* 8.0*  --  8.1* 8.5  MG 1.3*  --  2.3 2.0  --  1.7 1.4*  PHOS  --  1.9* 1.5* 1.9*  --  2.9 2.8   Liver Function Tests:  Recent  Labs Lab 09/13/13 0435 09/14/13 0412 09/15/13 0537 09/16/13 0720  AST 70* 72* 56* 74*  ALT 39 43 40 48  ALKPHOS 60 75 73 81  BILITOT 5.3* 4.3* 3.1* 2.3*  PROT 5.3* 5.3* 5.6* 5.7*  ALBUMIN 1.9* 1.9* 1.8* 1.9*   CBC:  Recent Labs Lab 09/11/13 0634 09/13/13 0435 09/14/13 0412 09/15/13 0537 09/16/13 0720  WBC 10.4 9.7 10.3 9.6 9.6  NEUTROABS  --  8.6* 9.2* 8.4* 8.3*  HGB 10.0* 10.7* 9.7* 9.3* 8.7*  HCT 29.0* 30.2* 28.1* 27.4* 25.2*  MCV 97.0 93.8 96.2 98.6 97.3  PLT 175 222 228 269 315    Recent Results (from the past 240 hour(s))  CULTURE, BLOOD (ROUTINE X 2)     Status: None   Collection Time    09/07/13 11:41 AM      Result Value Range Status   Specimen Description BLOOD LEFT ANTECUBITAL   Final   Special Requests BOTTLES DRAWN AEROBIC AND ANAEROBIC 8CC   Final   Culture NO GROWTH 5 DAYS   Final   Report Status 09/12/2013 FINAL   Final  CULTURE, BLOOD (ROUTINE X 2)     Status: None   Collection Time    09/07/13 11:43 AM      Result Value Range Status   Specimen Description BLOOD RIGHT ANTECUBITAL   Final   Special Requests BOTTLES DRAWN AEROBIC AND ANAEROBIC 12CC   Final   Culture NO GROWTH 5 DAYS   Final   Report Status 09/12/2013 FINAL   Final  MRSA PCR SCREENING     Status: None   Collection Time    09/07/13  7:23 PM      Result Value Range Status   MRSA by PCR NEGATIVE  NEGATIVE Final   Comment:            The GeneXpert MRSA Assay (FDA     approved for NASAL specimens     only), is one component of a     comprehensive MRSA colonization     surveillance program. It is not     intended to diagnose MRSA     infection nor to guide or     monitor treatment for     MRSA infections.  URINE CULTURE     Status: None   Collection Time    09/08/13 10:11 AM      Result Value Range Status   Specimen Description URINE, CLEAN CATCH   Final   Special Requests NONE   Final   Culture  Setup Time     Final   Value: 09/08/2013 13:29     Performed at [facility] Count     Final   Value: NO GROWTH     Performed at [facility]   Culture     Final  Value: NO GROWTH     Performed at [facility]   Report Status 09/09/2013 FINAL   Final     Studies:  Recent x-ray studies have been reviewed in detail by the Attending Physician   On-Call/Text Page:      [email or web address]      password [email or web address]  If 7PM-7AM, please contact night-coverage [email or web address] Password [email or web address] 09/16/2013, 3:01 PM   LOS: 9 days    Carolyne Littles, J,MD [phone] 09/16/2013, 3:01 PM

## 2013-09-16 NOTE — Progress Notes (Signed)
Physical Therapy Treatment Patient Details Name: Peter Clarke MRN: [id number] DOB: [birth year] Today's Date: 09/16/2013 Time: 4098-1191 PT Time Calculation (min): 29 min  PT Assessment / Plan / Recommendation  History of Present Illness 67 year old male with remote history of cardiac transplant at [name] 11 years ago on chronic immunosuppressive therapy. 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 He also has underlying chronic kidney disease stage III. He presented to [facility] with abdominal pain associated with diarrhea but no melena or hematochezia and no nausea/vomiting. He initially presented to [facility] where CT of the abdomen and pelvis revealed abnormal thickening of the sigmoid colon with perforation. Given his underlying medical history she was subsequently transferred to [facility] for further treatment. After arrival here he has been evaluated by Dr. [name] with surgery who recommended conservative treatment with bowel rest and IV antibiotics. In review of his laboratory values he had a leukocytosis with white count of 13,000 and creatinine had increased to 2.96 from 1.57. Pt has no appetite, last time he ate was Sunday.  Denies n/v. Drank tea yesterday.  No flatus or stool in ostomy.  Seems  Very lethargic, on PCA dilaudid.  Got up with PT.  No dysuria.    PT Comments   **Pt unsteady while walking 180' with mod assist for balance. Pain, fatigue limit activity tolerance. SNF recommended. *  Follow Up Recommendations  SNF;Supervision/Assistance - 24 hour     Does the patient have the potential to tolerate intense rehabilitation     Barriers to Discharge        Equipment Recommendations  Rolling walker with 5" wheels    Recommendations for Other Services    Frequency Min 3X/week   Progress towards PT Goals Progress towards PT goals: Progressing toward goals  Plan Current plan remains  appropriate    Precautions / Restrictions Precautions Precautions: Fall Restrictions Weight Bearing Restrictions: No   Pertinent Vitals/Pain *7/10 abdomen while walking RN notified**    Mobility  Bed Mobility Bed Mobility: Rolling Right;Right Sidelying to Sit;Sitting - Scoot to [place] of Bed;Sit to Sidelying Right;Rolling Left;Left Sidelying to Sit Rolling Left: 4: Min assist;With rail Left Sidelying to Sit: 3: Mod assist;With rails Sitting - Scoot to Edge of Bed: 3: Mod assist;With rail Details for Bed Mobility Assistance: assist to initiate movement, assist to raise trunk and guide BLEs Transfers Transfers: Sit to Stand;Stand to Sit Sit to Stand: With upper extremity assist;3: Mod assist;From bed Stand to Sit: With upper extremity assist;3: Mod assist;To chair/3-in-1;To bed Details for Transfer Assistance: Mod assist for balance, pt unsteady. Increased time due to colostomy leak, assisted RN with cleanup.  Ambulation/Gait Ambulation/Gait Assistance: 3: Mod assist Ambulation Distance (Feet): 180 Feet Assistive device: 1 person hand held assist;Other (Comment) (IV pole) Ambulation/Gait Assistance Details: BUE support required as well as mod assist to maintain balance, pt tends to lean to R and posteriorly, very unsteady Gait Pattern: Decreased step length - right;Decreased step length - left;Narrow base of support Gait velocity: decreased General Gait Details: VCs to lift head    Exercises General Exercises - Lower Extremity Long Arc QuadBarbaraann Clarke;Both;10 reps;Seated   PT Diagnosis:    PT Problem List:   PT Treatment Interventions:     PT Goals (current goals can now be found in the care plan section) Acute Rehab PT Goals Patient Stated Goal: none specified  PT Goal Formulation: With patient/family Time For Goal Achievement:  09/26/13 Potential to Achieve Goals: Good  Visit Information  Last PT Received On: 09/16/13 Assistance Needed: +1 History of Present Illness:  67 year old male with remote history of cardiac transplant at [name] 11 years ago on chronic immunosuppressive therapy. 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 He also has underlying chronic kidney disease stage III. He presented to [facility] with abdominal pain associated with diarrhea but no melena or hematochezia and no nausea/vomiting. He initially presented to [facility] where CT of the abdomen and pelvis revealed abnormal thickening of the sigmoid colon with perforation. Given his underlying medical history she was subsequently transferred to [facility] for further treatment. After arrival here he has been evaluated by Dr. [name] with surgery who recommended conservative treatment with bowel rest and IV antibiotics. In review of his laboratory values he had a leukocytosis with white count of 13,000 and creatinine had increased to 2.96 from 1.57. Pt has no appetite, last time he ate was Sunday.  Denies n/v. Drank tea yesterday.  No flatus or stool in ostomy.  Seems  Very lethargic, on PCA dilaudid.  Got up with PT.  No dysuria.     Subjective Data  Patient Stated Goal: none specified    Cognition  Cognition Arousal/Alertness: Lethargic Overall Cognitive Status: Impaired/Different from baseline Area of Impairment: Memory;Problem solving Memory: Decreased short-term memory (Pt had difficulty recalling PMH and why he's in hospital.) Problem Solving: Slow processing;Requires verbal cues;Decreased initiation    Balance  Balance Balance Assessed: Yes Static Sitting Balance Static Sitting - Balance Support: Bilateral upper extremity supported;Feet supported Static Sitting - Level of Assistance: 4: Min assist Static Sitting - Comment/# of Minutes: 6  End of Session PT - End of Session Equipment Utilized During Treatment: Gait belt Activity Tolerance: Patient limited by fatigue;Patient limited by  pain Patient left: in chair;with call bell/phone within reach;with family/visitor present Nurse Communication: Mobility status   GP     Peter Clarke 09/16/2013, 9:05 AM [phone]

## 2013-09-16 NOTE — Consult Note (Signed)
WOC ostomy consult  Stoma type/location: LLQ, end colostomy Stomal assessment/size: 1 1/2" round, budded from the skin nicely, pink, moist Peristomal assessment: intact Treatment options for stomal/peristomal skin: none Output liquid, apparently high volumes of output, was leaking upon my arrival.  Ostomy pouching: 2pc. 2 3/4" pouch ordered by staff in the room, so I used this today, however with the stomal size we can go down a wafer/flange size which will make it easier to keep it off the midline wound edges.  Education provided: pt alone at the time of my visit. Demonstrated pouch change to the patient, however he did close his eyes during portion of the care. He opted not to remove the wafer himself.  Reviewed basic ostomy care and pouch change to the patient today. Supplies ordered.  WOC team will follow along with you for ostomy teaching. Jousha Schwandt Decker RN,CWOCN [phone]

## 2013-09-16 NOTE — Progress Notes (Signed)
Thank you for consulting the Palliative Medicine Team at [facility] to meet your patient's and family's needs.   The reason that you asked [place] to see your patient is for goals of care, options discussion  We have scheduled your patient for a meeting: tomorrow, Wednesday 10/1 @ 2:30 pm  The Surrogate decision maker is: wife, Lucendia Herrlich c: [phone]; h: [phone]  Other family members that need to be present: daughter Lindie Spruce will be coming from [place]  Your patient is able to participate: on this visit, patient alert, however appears to be overwhelmed with information, when discussed meeting with PMT tomorrow patient replied ok that's good then can we leave tonight- seems to have some intermittent confusion - had received IV pain medication 20 minutes prior to this visit   Additional Narrative: patient's wife Lucendia Herrlich shared this is their second marriage and that she wants patient's daughter, Lindie Spruce to participate in PMT discussion.   Valente David, RN 09/16/2013, 1:51 PM Palliative Medicine Team RN Liaison [phone]

## 2013-09-17 ENCOUNTER — Inpatient Hospital Stay (HOSPITAL_COMMUNITY): Payer: Medicare Other

## 2013-09-17 ENCOUNTER — Ambulatory Visit: Payer: Self-pay | Admitting: Oncology

## 2013-09-17 DIAGNOSIS — R4182 Altered mental status, unspecified: Secondary | ICD-10-CM

## 2013-09-17 DIAGNOSIS — I5032 Chronic diastolic (congestive) heart failure: Secondary | ICD-10-CM

## 2013-09-17 DIAGNOSIS — Z515 Encounter for palliative care: Secondary | ICD-10-CM

## 2013-09-17 LAB — AMMONIA: Ammonia: 29 umol/L (ref 11–60)

## 2013-09-17 LAB — COMPREHENSIVE METABOLIC PANEL
AST: 68 U/L — ABNORMAL HIGH (ref 0–37)
BUN: 21 mg/dL (ref 6–23)
CO2: 19 mEq/L (ref 19–32)
Calcium: 7.9 mg/dL — ABNORMAL LOW (ref 8.4–10.5)
Chloride: 102 mEq/L (ref 96–112)
Creatinine, Ser: 1.4 mg/dL — ABNORMAL HIGH (ref 0.50–1.35)
GFR calc non Af Amer: 50 mL/min — ABNORMAL LOW (ref >90)
Sodium: 131 mEq/L — ABNORMAL LOW (ref 135–145)
Total Bilirubin: 1.7 mg/dL — ABNORMAL HIGH (ref 0.3–1.2)
Total Protein: 5.5 g/dL — ABNORMAL LOW (ref 6.0–8.3)

## 2013-09-17 LAB — CBC WITH DIFFERENTIAL/PLATELET
Eosinophils Absolute: 0 10*3/uL (ref 0.0–0.7)
Hemoglobin: 8.1 g/dL — ABNORMAL LOW (ref 13.0–17.0)
Lymphocytes Relative: 5 % — ABNORMAL LOW (ref 12–46)
Lymphs Abs: 0.4 10*3/uL — ABNORMAL LOW (ref 0.7–4.0)
MCH: 33.8 pg (ref 26.0–34.0)
Monocytes Relative: 8 % (ref 3–12)
Neutro Abs: 6.7 10*3/uL (ref 1.7–7.7)
Neutrophils Relative %: 87 % — ABNORMAL HIGH (ref 43–77)
Platelets: 333 10*3/uL (ref 150–400)
RBC: 2.4 MIL/uL — ABNORMAL LOW (ref 4.22–5.81)
WBC: 7.7 10*3/uL (ref 4.0–10.5)

## 2013-09-17 LAB — PHOSPHORUS: Phosphorus: 2.8 mg/dL (ref 2.3–4.6)

## 2013-09-17 LAB — MAGNESIUM: Magnesium: 1.4 mg/dL — ABNORMAL LOW (ref 1.5–2.5)

## 2013-09-17 MED ORDER — VANCOMYCIN HCL 10 G IV SOLR
1250.0000 mg | INTRAVENOUS | Status: DC
  Administered 2013-09-17 – 2013-09-22 (×6): 1250 mg via INTRAVENOUS
  Filled 2013-09-17 (×7): qty 1250

## 2013-09-17 MED ORDER — PIPERACILLIN-TAZOBACTAM 3.375 G IVPB
3.3750 g | Freq: Three times a day (TID) | INTRAVENOUS | Status: DC
  Administered 2013-09-17 – 2013-09-20 (×9): 3.375 g via INTRAVENOUS
  Filled 2013-09-17 (×11): qty 50

## 2013-09-17 MED ORDER — MAGNESIUM SULFATE 40 MG/ML IJ SOLN
2.0000 g | Freq: Once | INTRAMUSCULAR | Status: AC
  Administered 2013-09-17: 2 g via INTRAVENOUS
  Filled 2013-09-17: qty 50

## 2013-09-17 NOTE — Progress Notes (Addendum)
[facility] PROGRESS NOTE  Peter Clarke ZOX:[id number] DOB: 1946/04/21 DOA: 09/07/2013 PCP: [name], MD  Assessment/Plan:  1. Diverticulitis with perforation  -care as per Gen Surgery - now s/p laparoscopic assisted Hartman's procedure (sigmoid colectomy with end colostomy) w/ lysis of adhesions x 1 hr-stable surgically-slow advance diet per C[facility]-surgery. -Continue ertapenem per surgery   2. History of heart transplant/chronic immunosuppression/history of Chronic Systolic heart failure/New grade 2 DD  -care as per CHF Team  -ECHO this admit showed grade 2 DD.  -Cardiology has been periodically updating transplant surgeon at [facility].  -Patient's CHF stable  -Patient on Prograf, increase to 2 mg sub BID.   3. Head and neck cancer  -tx'd at [place]  -Imuran dose decreased to 100 mg Daily in August when dx'd with throat cancer.  -continue home steroid dose   4. Hypomagnesemia  -Mg at 1.4, replete.   5. Hypophosphatemia; stable. K-Phos daily.   6. HTN (hypertension)  -BP within AHA guidelines.   7. CKD (chronic kidney disease) stage 3, GFR 30-59 ml/min  -renal function continues to improve current creatinine= 1.40 -Continuedextrose 5% in 1/2 NS   8. Malnutrition; patient attempting to eat and drink having some nausea  - Continue dextrose 5% in 1/2 NS. -Palliative care meeting today.   Code Status: Full Code.  Family Communication: care discussed with patient.  Disposition Plan: remain in patient, to be determine after palliative care meeting.    Consultants: Cardiology  General surgery   Procedures: 2-D echocardiogram  - Left ventricle: The cavity size was normal. Wall thickness was normal. Systolic function was normal. The estimated ejection fraction was in the range of 60% to 65%. Wall motion was normal; there were no regional wall motion abnormalities. Features are consistent with a pseudonormal left ventricular filling pattern, with concomitant abnormal  relaxation and increased filling pressure (grade 2 diastolic dysfunction). - Aortic valve: There was no stenosis. - Mitral valve: Trivial regurgitation. - Left atrium: The atrium was mildly dilated. - Right ventricle: Poorly visualized. The cavity size was normal. Systolic function was normal. - Right atrium: The atrium was mildly dilated. - Pulmonary arteries: No complete TR doppler jet so unable to estimate PA systolic pressure. - Systemic veins: IVC was not visualized.  09/07/2013 perforated sigmoid diverticulitis; S/P Laparoscopic assisted Hartman's procedure (sigmoid colectomy with end colostomy), lysis of adhesions x 1 hr by Dr. Gaynelle Adu (surgeon)   Antibiotics:    HPI/Subjective: Denies dyspnea. He wants to go home. No worsening abdominal pain. He still relates decrease appetite.   Objective: Filed Vitals:   09/17/13 1351  BP: 141/71  Pulse: 99  Temp: 99.1 F (37.3 C)  Resp: 20    Intake/Output Summary (Last 24 hours) at 09/17/13 1543 Last data filed at 09/17/13 1145  Gross per 24 hour  Intake      3 ml  Output   1890 ml  Net  -1887 ml   Filed Weights   09/07/13 1845 09/11/13 0400 09/11/13 1506  Weight: 79.6 kg (175 lb 7.8 oz) 82.8 kg (182 lb 8.7 oz) 84 kg (185 lb 3 oz)    Exam:   General:  No distress.   Cardiovascular: S 1, S 2 RRR  Respiratory: CTA  Abdomen: BS present, soft, clean dressing. Ostomy in place.   Musculoskeletal: no edema.   Data Reviewed: Basic Metabolic Panel:  Recent Labs Lab 09/13/13 0435 09/14/13 0412 09/14/13 1540 09/15/13 0537 09/16/13 0720 09/17/13 0535  NA 134* 135  --  134* 135  131*  K 3.5 3.7 4.1 4.5 5.4* 4.6  CL 100 101  --  102 105 102  CO2 24 23  --  20 20 19   GLUCOSE 115* 106*  --  127* 126* 131*  BUN 39* 50*  --  42* 31* 21  CREATININE 1.22 1.63*  --  1.43* 1.50* 1.40*  CALCIUM 8.0* 8.0*  --  8.1* 8.5 7.9*  MG 2.3 2.0  --  1.7 1.4* 1.4*  PHOS 1.5* 1.9*  --  2.9 2.8 2.8   Liver Function  Tests:  Recent Labs Lab 09/13/13 0435 09/14/13 0412 09/15/13 0537 09/16/13 0720 09/17/13 0535  AST 70* 72* 56* 74* 68*  ALT 39 43 40 48 54*  ALKPHOS 60 75 73 81 77  BILITOT 5.3* 4.3* 3.1* 2.3* 1.7*  PROT 5.3* 5.3* 5.6* 5.7* 5.5*  ALBUMIN 1.9* 1.9* 1.8* 1.9* 1.8*   No results found for this basename: LIPASE, AMYLASE,  in the last 168 hours No results found for this basename: AMMONIA,  in the last 168 hours CBC:  Recent Labs Lab 09/13/13 0435 09/14/13 0412 09/15/13 0537 09/16/13 0720 09/17/13 0535  WBC 9.7 10.3 9.6 9.6 7.7  NEUTROABS 8.6* 9.2* 8.4* 8.3* 6.7  HGB 10.7* 9.7* 9.3* 8.7* 8.1*  HCT 30.2* 28.1* 27.4* 25.2* 23.4*  MCV 93.8 96.2 98.6 97.3 97.5  PLT 222 228 269 315 333   Cardiac Enzymes: No results found for this basename: CKTOTAL, CKMB, CKMBINDEX, TROPONINI,  in the last 168 hours BNP (last 3 results) No results found for this basename: PROBNP,  in the last 8760 hours CBG: No results found for this basename: GLUCAP,  in the last 168 hours  Recent Results (from the past 240 hour(s))  MRSA PCR SCREENING     Status: None   Collection Time    09/07/13  7:23 PM      Result Value Range Status   MRSA by PCR NEGATIVE  NEGATIVE Final   Comment:            The GeneXpert MRSA Assay (FDA     approved for NASAL specimens     only), is one component of a     comprehensive MRSA colonization     surveillance program. It is not     intended to diagnose MRSA     infection nor to guide or     monitor treatment for     MRSA infections.  URINE CULTURE     Status: None   Collection Time    09/08/13 10:11 AM      Result Value Range Status   Specimen Description URINE, CLEAN CATCH   Final   Special Requests NONE   Final   Culture  Setup Time     Final   Value: 09/08/2013 13:29     Performed at [facility] Count     Final   Value: NO GROWTH     Performed at [facility]   Culture     Final   Value: NO GROWTH     Performed at [facility]   Report Status 09/09/2013 FINAL   Final     Studies: No results found.  Scheduled Meds: . azaTHIOprine  100 mg Oral Daily  . ertapenem  1 g Intravenous Q24H  . feeding supplement  237 mL Oral TID BM  . heparin  5,000 Units Subcutaneous Q8H  . loperamide  2 mg Oral QID  . metoprolol  5 mg Intravenous Q6H  . multivitamin with minerals  1 tablet Oral Daily  . phosphorus  250 mg Oral Daily  . predniSONE  5 mg Oral Daily  . sodium chloride  3 mL Intravenous Q12H  . tacrolimus  2 mg Sublingual BID   Continuous Infusions: . dextrose 5 % and 0.45% NaCl 200 mL/hr at 09/17/13 1610    Principal Problem:   HTN (hypertension) Active Problems:   History of heart transplant/chronic immunosupression   Head and neck cancer   Diverticulitis with perforation   Chronic diastolic heart failure   Pre-operative cardiovascular examination   Fever   Leukocytosis   CKD (chronic kidney disease) stage 3, GFR 30-59 ml/min   Malnutrition of moderate degree   Hypomagnesemia   Quality of life palliative care encounter    Time spent: 25 minutes.     Cherron Blitzer  [facility] Pager [phone]. If 7PM-7AM, please contact night-coverage at [email or web address], password [facility] 09/17/2013, 3:43 PM  LOS: 10 days    Discussed case with Ladona Ridgel. Patient is confuse. Wife helping with medical decisions. Will try to improve nutrition status to see if this help with confusion.  Will start TPN, will order PICC line. Patient spike mild fever 100.2.  Will broad antibiotics to Vancomycin and zosyn. Blood culture, check chest x ray. Spoke with Dr Corliss Skains will hold on CT scan and repeat CBC in am. Surgery will follow.

## 2013-09-17 NOTE — Consult Note (Signed)
Patient Peter Clarke      DOB: 1946/07/30      YNW:[id number]  Summary of Goals of Care; full consult to follow:  Met with patient and daughter Aundra Millet, wife Lucendia Herrlich   Patient unable to effectively participate in meeting due to confusion which is likely delirium related to possible infection (temp spike to 100.2 with chills at the end of my visit).  Spouse accepting that patient can not participate.  Not functional status reasonable prior to surgery had been up and about going to work before admission.  Patient was given the opportunity to complete advanced directive plan from cancer center but told his wife he didn't want to do it.  He won't talk with his family about his wishes but he has expressed to his daughter that if he knew how sick was going to feel he might have not chose the have treatment. Patient has suffered unfortunate, unrelated illness relative to his cancer.  His current tonsillar cancer was in the early stages of treatment and while he was handicapped by not being able to have induction chemo he was facing reasonable odds of recover.  Patient's daughter and spouse are in agreement to a trial of TPN, and to investigate causes of delirium and treat.  We talked about long term options for rehab if he was able to stabilize before going home.  I spoke with Dr. Francee Piccolo to obtain a clear picture of patient's prognosis which is important to his goals of care.  The patient continues to not comment on his code status wishes and his wife would like to see if he can clear his cognition before stepping in to make a decision on his behalf.   Recommendations:  1.  Full code for now.  2.  Explore causes for delirium which at this time might be related to infection.  Broaden antibiotics to cover abdomen and consider CT abdomen if indicated.  This was conveyed to Dr. Sunnie Nielsen.  3.  Continue to try to mobilize with PT  4.  Start TPN/ PICC  Will be meeting with spouse and daughter again at 1130 am  tomorrow to follow up.  Discussed with Dr. Sunnie Nielsen , [name]  Total time:  230 pm- 430 pm  Dennise Raabe L. Ladona Ridgel, MD MBA The Palliative Medicine Team at [facility] Phone: [phone] Pager: [phone]

## 2013-09-17 NOTE — Progress Notes (Signed)
8 Days Post-Op  Subjective: Pt states he has difficulties with swallowing pills.  Tolerating ensure.  No appetite, 0% of breakfast consumed.  Wife at bedside.  Pt states he wants to go home  Objective: Vital signs in last 24 hours: Temp:  [98 F (36.7 C)-98.2 F (36.8 C)] 98 F (36.7 C) (10/01 0705) Pulse Rate:  [85-93] 87 (10/01 0705) Resp:  [18] 18 (10/01 0705) BP: (113-137)/(72-75) 129/72 mmHg (10/01 0705) SpO2:  [93 %-97 %] 93 % (10/01 0705) Last BM Date: 09/16/13  Intake/Output from previous day: 09/30 0701 - 10/01 0700 In: 3 [I.V.:3] Out: 1690 [Urine:565; Stool:1125] Intake/Output this shift: Total I/O In: -  Out: 300 [Urine:300]  PE General appearance: cooperative, no distress, confused at times  GI: +bs abdomen is soft, non distended and tender at incision site. Midline incision is clean dry and intact.  Staples removed from scope sites on right side.  Green output in ostomy, liquid.  Extremities: extremities normal, atraumatic, no cyanosis or edema  Lab Results:   Recent Labs  09/16/13 0720 09/17/13 0535  WBC 9.6 7.7  HGB 8.7* 8.1*  HCT 25.2* 23.4*  PLT 315 333   BMET  Recent Labs  09/16/13 0720 09/17/13 0535  NA 135 131*  K 5.4* 4.6  CL 105 102  CO2 20 19  GLUCOSE 126* 131*  BUN 31* 21  CREATININE 1.50* 1.40*  CALCIUM 8.5 7.9*   Anti-infectives: Anti-infectives   Start     Dose/Rate Route Frequency Ordered Stop   09/07/13 2000  ertapenem (INVANZ) 1 g in sodium chloride 0.9 % 50 mL IVPB     1 g 100 mL/hr over 30 Minutes Intravenous Every 24 hours 09/07/13 1914     09/07/13 1430  piperacillin-tazobactam (ZOSYN) IVPB 3.375 g     3.375 g 12.5 mL/hr over 240 Minutes Intravenous  Once 09/07/13 1425 09/07/13 1516   09/07/13 1430  metroNIDAZOLE (FLAGYL) IVPB 500 mg     500 mg 100 mL/hr over 60 Minutes Intravenous  Once 09/07/13 1425 09/07/13 1617      Assessment/Plan: Diastolic heart failure  CKD  HTN  Head and neck cancer  S/p heart  transplant  Leukocytosis  Diverticulitis  S/p Hartmans (Dr. [name] 09/10/13) PCM  Plan Continue with ensure TID, pt states he does not have an appetite and adverse taste.   Meeting planned today with palliative care.  The patient expresses the desire to go home without further intervention, wife does not appear to be at this point at present time.  She is very tearful. Less ostomy output with lmodium 2350-->1668ml Continue with IVF Routine ostomy and wound care    LOS: 10 days    Bonner Puna [facility] ANP-BC Pager [phone]  09/17/2013 9:24 AM

## 2013-09-17 NOTE — Progress Notes (Signed)
PT Cancellation Note  Patient Details Name: Peter Clarke MRN: [id number] DOB: 26-Mar-1946   Cancelled Treatment:    Reason Eval/Treat Not Completed: Other (comment) Pt declined therapy today.  Would like to wait until after palliative meeting this afternoon.   Marguita Venning,KATHrine E 09/17/2013, 9:53 AM Zenovia Jarred, PT, DPT 09/17/2013 Pager: [phone]

## 2013-09-17 NOTE — Clinical Social Work Note (Signed)
Clinical Social Worker is continuing to follow. CSW reviewed chart and noticed PMT meeting with family. CSW will await outcome from their meeting on appropriate disposition.   Rozetta Nunnery MSW, [employer] [phone]

## 2013-09-17 NOTE — Consult Note (Signed)
Patient ZO:XWRUE TAFT Clarke      DOB: 08/04/1946      AVW:[id number]     Consult Note from the Palliative Medicine Team at [facility]    Consult Requested by: Dr. Sunnie Nielsen    PCP: Peter Punt, MD Reason for Consultation: GOC    Phone Number:[phone] Symptom recommendations Assessment of patients Current state: 67 yr old white male with past medical history for heart transplant on immunosuppressive agents.  Patient was admitted with a ruptured diverticulum requiring surgical intervention.  We were asked to assist with goals of care as the patient continues to do poorly.  On arrival patient was delirious and unable to participate in goals of care.  I spoke with the patient's daughter and spouse.  At this time their best assessment of what they believe Peter Clarke would like is to continue with curative care.  They never spoke of his advanced directives . At this time,they desire to keep his code status full and try to have him help with the decisions.   Peter Clarke and Peter Clarke also would like to initiate TPN until he is able to eat by mouth.   Upon return to see Peter Clarke, we found him with chills, warm skin suggesting fever.  The primary MD was contacted and antibiotics were broadened.   Goals of Care: 1.  Code Status: Full Code   2. Scope of Treatment: Family desires to pursue full curative treatment to give Peter Clarke the opportunity to improve and participate in his own goals of care.   4. Disposition: to be determined   3. Symptom Management:   1. Anxiety/Agitation: delirium possibly related to infection, vs CNS process 2. Pain: OxyIR as needed 3. Delirium: suspect infection vs CNS process.  Antibiotics broadened.  See my summary note on the day of service. 4.  Anorexia: start TPN    4. Psychosocial: Daughter Peter Clarke is very supportive, Peter Clarke his wife is at his bedside throughout.  5. Spiritual:Chaplain services have been offered.        Patient Documents Completed or Given: Document Given  Completed  Advanced Directives Pkt    MOST    DNR    Gone from My Sight    Hard Choices      Brief HPI: 67 yr old white male admitted with ruptured diverticulum requiring colostomy. Patient continues to fail.  We were asked to perform goals of care.   ROS: confused, no appetite, not eating, back pain,no nausea    PMH:  Past Medical History  Diagnosis Date  . Hypertension   . H/O heart transplant 2003  . Wears glasses   . Wears dentures     upper  . Renal insufficiency   . Cancer     tonsil  . Throat cancer      PSH: Past Surgical History  Procedure Laterality Date  . Heart transplant N/A 2003    [facility]-  . Appendectomy    . Colonoscopy    . Hernia repair  2008    rt ing  . Cardiac catheterization  2011  . Tonsillectomy Left 07/07/2013    Procedure: TONSILLECTOMY;  Surgeon: Darletta Moll, MD;  Location: [facility];  Service: ENT;  Laterality: Left;  . Direct laryngoscopy N/A 07/07/2013    Procedure: DIRECT LARYNGOSCOPY WITH BIOPSY;  Surgeon: Darletta Moll, MD;  Location: [facility];  Service: ENT;  Laterality: N/A;  . Colon resection N/A 09/09/2013    Procedure: Lap. Assisted Colectomy, lysis of adhesions, Colostomy;  Surgeon: Atilano Ina, MD;  Location[facility] OR;  Service: General;  Laterality: N/A;   I have reviewed the FH and SH and  If appropriate update it with new information. Allergies  Allergen Reactions  . Pepto-Bismol [Bismuth Subsalicylate]     rash   Scheduled Meds: . azaTHIOprine  100 mg Oral Daily  . feeding supplement  237 mL Oral TID BM  . heparin  5,000 Units Subcutaneous Q8H  . loperamide  2 mg Oral QID  . magnesium sulfate 1 - 4 g bolus IVPB  2 g Intravenous Once  . metoprolol  5 mg Intravenous Q6H  . multivitamin with minerals  1 tablet Oral Daily  . phosphorus  250 mg Oral Daily  . piperacillin-tazobactam (ZOSYN)  IV  3.375 g Intravenous Q8H  . predniSONE  5 mg Oral Daily  . sodium chloride  3 mL Intravenous Q12H   . tacrolimus  2 mg Sublingual BID  . vancomycin  1,250 mg Intravenous Q24H   Continuous Infusions: . dextrose 5 % and 0.45% NaCl 200 mL/hr at 09/17/13 1551   PRN Meds:.HYDROmorphone (DILAUDID) injection, ondansetron (ZOFRAN) IV    BP 124/77  Pulse 105  Temp(Src) 100.2 F (37.9 C) (Oral)  Resp 20  Ht 6' (1.829 m)  Wt 84 kg (185 lb 3 oz)  BMI 25.11 kg/m2  SpO2 97%   PPS: 30-40%   Intake/Output Summary (Last 24 hours) at 09/17/13 1837 Last data filed at 09/17/13 1555  Gross per 24 hour  Intake 5859.67 ml  Output   1915 ml  Net 3944.67 ml    Physical Exam:  General: delirious, diaphoretic HEENT:  PERRL, EOMI, anicteric, mm very dry, dentures in place Chest:   Decreased but clear, no rhonchi,rales or wheezes CVS: tachy, s1, S2 Abdomen:tender without guarding or rebound Ext: warm, no edema Neuro:confused, agitated, CN  Appear to be intact  Labs: CBC    Component Value Date/Time   WBC 7.7 09/17/2013 0535   RBC 2.40* 09/17/2013 0535   HGB 8.1* 09/17/2013 0535   HCT 23.4* 09/17/2013 0535   PLT 333 09/17/2013 0535   MCV 97.5 09/17/2013 0535   MCH 33.8 09/17/2013 0535   MCHC 34.6 09/17/2013 0535   RDW 13.8 09/17/2013 0535   LYMPHSABS 0.4* 09/17/2013 0535   MONOABS 0.6 09/17/2013 0535   EOSABS 0.0 09/17/2013 0535   BASOSABS 0.0 09/17/2013 0535      CMP     Component Value Date/Time   NA 131* 09/17/2013 0535   K 4.6 09/17/2013 0535   CL 102 09/17/2013 0535   CO2 19 09/17/2013 0535   GLUCOSE 131* 09/17/2013 0535   BUN 21 09/17/2013 0535   CREATININE 1.40* 09/17/2013 0535   CREATININE 1.99* 06/14/2013 0825   CALCIUM 7.9* 09/17/2013 0535   PROT 5.5* 09/17/2013 0535   ALBUMIN 1.8* 09/17/2013 0535   AST 68* 09/17/2013 0535   ALT 54* 09/17/2013 0535   ALKPHOS 77 09/17/2013 0535   BILITOT 1.7* 09/17/2013 0535   GFRNONAA 50* 09/17/2013 0535   GFRAA 59* 09/17/2013 0535    Chest Xray Reviewed/Impressions: small bilateral pleural effusions  CT scan of the Head Reviewed/Impressions:  Pending    Time In Time Out Total Time Spent with Patient Total Overall Time  230 pm 430 pm 120 min    Greater than 50%  of this time was spent counseling and coordinating care related to the above assessment and plan.    Chanique Duca L. Ladona Ridgel, MD MBA The Palliative  Medicine Team at [facility] Team Phone: [phone] Pager: [phone]

## 2013-09-17 NOTE — Progress Notes (Signed)
ANTIBIOTIC CONSULT NOTE - INITIAL  Pharmacy Consult for Vancomycin, Zosyn Indication: s/p perforated bowel - empiric   Allergies  Allergen Reactions  . Pepto-Bismol [Bismuth Subsalicylate]     rash   Labs:  Recent Labs  09/15/13 0537 09/16/13 0720 09/17/13 0535  WBC 9.6 9.6 7.7  HGB 9.3* 8.7* 8.1*  PLT 269 315 333  CREATININE 1.43* 1.50* 1.40*   Estimated Creatinine Clearance: 56.2 ml/min (by C-G formula based on Cr of 1.4). No results found for this basename: VANCOTROUGH, Leodis Binet, VANCORANDOM, GENTTROUGH, GENTPEAK, GENTRANDOM, TOBRATROUGH, TOBRAPEAK, TOBRARND, AMIKACINPEAK, AMIKACINTROU, AMIKACIN,  in the last 72 hours   Microbiology: Recent Results (from the past 720 hour(s))  CULTURE, BLOOD (ROUTINE X 2)     Status: None   Collection Time    09/07/13 11:41 AM      Result Value Range Status   Specimen Description BLOOD LEFT ANTECUBITAL   Final   Special Requests BOTTLES DRAWN AEROBIC AND ANAEROBIC 8CC   Final   Culture NO GROWTH 5 DAYS   Final   Report Status 09/12/2013 FINAL   Final  CULTURE, BLOOD (ROUTINE X 2)     Status: None   Collection Time    09/07/13 11:43 AM      Result Value Range Status   Specimen Description BLOOD RIGHT ANTECUBITAL   Final   Special Requests BOTTLES DRAWN AEROBIC AND ANAEROBIC 12CC   Final   Culture NO GROWTH 5 DAYS   Final   Report Status 09/12/2013 FINAL   Final  MRSA PCR SCREENING     Status: None   Collection Time    09/07/13  7:23 PM      Result Value Range Status   MRSA by PCR NEGATIVE  NEGATIVE Final   Comment:            The GeneXpert MRSA Assay (FDA     approved for NASAL specimens     only), is one component of a     comprehensive MRSA colonization     surveillance program. It is not     intended to diagnose MRSA     infection nor to guide or     monitor treatment for     MRSA infections.  URINE CULTURE     Status: None   Collection Time    09/08/13 10:11 AM      Result Value Range Status   Specimen Description  URINE, CLEAN CATCH   Final   Special Requests NONE   Final   Culture  Setup Time     Final   Value: 09/08/2013 13:29     Performed at [facility] Count     Final   Value: NO GROWTH     Performed at [facility]   Culture     Final   Value: NO GROWTH     Performed at [facility]   Report Status 09/09/2013 FINAL   Final    Medical History: Past Medical History  Diagnosis Date  . Hypertension   . H/O heart transplant 2003  . Wears glasses   . Wears dentures     upper  . Renal insufficiency   . Cancer     tonsil  . Throat cancer    Assessment: 67 year old male with head and neck cancer s/p heart transplant now s/p laparoscopic sigmoid colectomy with end colostomy for perforated bowel previously on ertapenem post surgery now to begin vancomycin and Zosyn after  low grade fever this PM. With CKD - CrCl = 56 ml/ min  Goal of Therapy:  Vancomycin trough level 10-15 mcg/ml Appropriate Zosyn dosing  Plan:  1) Zosyn 3.375 grams iv Q 8 hours - 4 hr infusion 2) Vancomycin 1250 mg iv Q 24 hours 3) Continue to follow Scr, cultures, progress, and fever curve  Thank you. Okey Regal, PharmD [phone]  09/17/2013,5:15 PM

## 2013-09-17 NOTE — Progress Notes (Signed)
Still with poor appetite, motivation Decreased ostomy output  Will await palliative care meeting.  No acute surgical issues.  Wilmon Arms. Corliss Skains, MD, [facility]  General/ Trauma Surgery  09/17/2013 9:37 AM

## 2013-09-18 DIAGNOSIS — Z515 Encounter for palliative care: Secondary | ICD-10-CM

## 2013-09-18 DIAGNOSIS — I5032 Chronic diastolic (congestive) heart failure: Secondary | ICD-10-CM

## 2013-09-18 DIAGNOSIS — I10 Essential (primary) hypertension: Secondary | ICD-10-CM

## 2013-09-18 DIAGNOSIS — E440 Moderate protein-calorie malnutrition: Secondary | ICD-10-CM

## 2013-09-18 LAB — COMPREHENSIVE METABOLIC PANEL
ALT: 41 U/L (ref 0–53)
AST: 42 U/L — ABNORMAL HIGH (ref 0–37)
Albumin: 1.6 g/dL — ABNORMAL LOW (ref 3.5–5.2)
Alkaline Phosphatase: 73 U/L (ref 39–117)
BUN: 15 mg/dL (ref 6–23)
Glucose, Bld: 130 mg/dL — ABNORMAL HIGH (ref 70–99)
Potassium: 4.9 mEq/L (ref 3.5–5.1)
Sodium: 129 mEq/L — ABNORMAL LOW (ref 135–145)
Total Protein: 5.1 g/dL — ABNORMAL LOW (ref 6.0–8.3)

## 2013-09-18 LAB — CBC WITH DIFFERENTIAL/PLATELET
Basophils Absolute: 0 10*3/uL (ref 0.0–0.1)
Eosinophils Absolute: 0 10*3/uL (ref 0.0–0.7)
Eosinophils Relative: 1 % (ref 0–5)
HCT: 22.3 % — ABNORMAL LOW (ref 39.0–52.0)
Lymphocytes Relative: 6 % — ABNORMAL LOW (ref 12–46)
MCH: 33.5 pg (ref 26.0–34.0)
MCHC: 34.1 g/dL (ref 30.0–36.0)
MCV: 98.2 fL (ref 78.0–100.0)
Monocytes Absolute: 0.8 10*3/uL (ref 0.1–1.0)
Neutrophils Relative %: 84 % — ABNORMAL HIGH (ref 43–77)
RDW: 13.9 % (ref 11.5–15.5)
WBC: 8.3 10*3/uL (ref 4.0–10.5)

## 2013-09-18 LAB — TACROLIMUS LEVEL: Tacrolimus (FK506) - LabCorp: 8.6 ng/mL

## 2013-09-18 LAB — PHOSPHORUS: Phosphorus: 3.4 mg/dL (ref 2.3–4.6)

## 2013-09-18 MED ORDER — SODIUM CHLORIDE 0.9 % IV SOLN
INTRAVENOUS | Status: DC
  Administered 2013-09-22: 20 mL/h via INTRAVENOUS

## 2013-09-18 MED ORDER — SODIUM CHLORIDE 0.9 % IV SOLN
100.0000 mg | Freq: Every day | INTRAVENOUS | Status: DC
  Administered 2013-09-18 – 2013-09-22 (×5): 100 mg via INTRAVENOUS
  Filled 2013-09-18 (×8): qty 100

## 2013-09-18 MED ORDER — INSULIN ASPART 100 UNIT/ML ~~LOC~~ SOLN
0.0000 [IU] | Freq: Four times a day (QID) | SUBCUTANEOUS | Status: DC
  Administered 2013-09-19 (×2): 1 [IU] via SUBCUTANEOUS
  Administered 2013-09-19 – 2013-09-20 (×2): 2 [IU] via SUBCUTANEOUS
  Administered 2013-09-20 – 2013-09-21 (×4): 1 [IU] via SUBCUTANEOUS
  Administered 2013-09-21: 07:00:00 via SUBCUTANEOUS

## 2013-09-18 MED ORDER — CLINIMIX E/DEXTROSE (5/15) 5 % IV SOLN
INTRAVENOUS | Status: AC
  Administered 2013-09-19: via INTRAVENOUS
  Filled 2013-09-18: qty 1000

## 2013-09-18 MED ORDER — SODIUM CHLORIDE 0.9 % IV SOLN
INTRAVENOUS | Status: AC
  Administered 2013-09-18: 10:00:00 via INTRAVENOUS

## 2013-09-18 MED ORDER — SODIUM CHLORIDE 0.9 % IJ SOLN
10.0000 mL | INTRAMUSCULAR | Status: DC | PRN
  Administered 2013-09-21: 20 mL
  Administered 2013-09-22: 10 mL

## 2013-09-18 MED ORDER — FAT EMULSION 20 % IV EMUL
250.0000 mL | INTRAVENOUS | Status: AC
  Administered 2013-09-19: 250 mL via INTRAVENOUS
  Filled 2013-09-18: qty 250

## 2013-09-18 MED ORDER — K PHOS MONO-SOD PHOS DI & MONO 155-852-130 MG PO TABS
250.0000 mg | ORAL_TABLET | Freq: Every day | ORAL | Status: DC
  Administered 2013-09-18: 250 mg via ORAL
  Filled 2013-09-18: qty 1

## 2013-09-18 MED ORDER — MAGNESIUM SULFATE 40 MG/ML IJ SOLN: 2.0000 g | Freq: Once | INTRAMUSCULAR | Status: DC

## 2013-09-18 MED ORDER — NYSTATIN 100000 UNIT/ML MT SUSP
5.0000 mL | Freq: Four times a day (QID) | OROMUCOSAL | Status: DC
  Administered 2013-09-18 – 2013-09-23 (×14): 500000 [IU] via ORAL
  Filled 2013-09-18 (×23): qty 5

## 2013-09-18 MED ORDER — MAGNESIUM SULFATE 40 MG/ML IJ SOLN
2.0000 g | Freq: Once | INTRAMUSCULAR | Status: AC
  Administered 2013-09-18: 2 g via INTRAVENOUS
  Filled 2013-09-18: qty 50

## 2013-09-18 NOTE — Progress Notes (Signed)
NUTRITION FOLLOW UP  Intervention:   TPN per PharmD, see energy/protein recommendations Recommend placing NG tube and initiating trickle tube feeds: Vital AF 1.2 @ 20 ml/hr which will provide 576 kcal, 36 grams of protein, and 389 ml of H20.  Continue Ensure Complete TID Encourage PO intake Continue Multivitamin with minerals PO  Nutrition Dx:   Inadequate oral intake related to lack of appetite as evidenced by pt not eating; ongoing  Goal:   Pt to meet >/= 90% of their estimated nutrition needs; not met  Monitor:   PO intake; very poor Diet advancement; dysphagia 3 Weight; no new wt Labs; low sodium, low calcium, low albumin, low hemoglobin, low GFR   Assessment:   Pt has been refusing to eat. Per MD note, poor energy and Hgb 7.6- plans to transfuse 2 units PRBC to get Hgb above 10 with hopes it may help with energy and maybe appetite. PICC line has been placed with plans for TPN. Per PharmD note: Begin Clinimix E 5/15 at 40 ml/hr and lipids at 6ml/hr. Will advance to goal of 90 ml/hr as tolerated. (Goal rate: Clinimix 5/15 at 90 ml/hr and lipids at 28ml/hr will provide 108 gm protein; 2013 kcal).  Pt has functioning GI tract, tolerating small amounts of PO. Highly recommend trickle tube feedings to help preserve GI immunity and function.   Height: Ht Readings from Last 1 Encounters:  09/11/13 6' (1.829 m)    Weight Status:   Wt Readings from Last 1 Encounters:  09/11/13 185 lb 3 oz (84 kg)    Re-estimated needs:  Kcal: 2000-2200  Protein: 100-110 grams  Fluid: >/=2.2 L/day  Skin: non-pitting RLE and LLE edema; abdominal incision  Diet Order: Dysphagia   Intake/Output Summary (Last 24 hours) at 09/18/13 1200 Last data filed at 09/18/13 0951  Gross per 24 hour  Intake 7046.67 ml  Output   2600 ml  Net 4446.67 ml    Last BM: 10/1   Labs:   Recent Labs Lab 09/16/13 0720 09/17/13 0535 09/18/13 0612  NA 135 131* 129*  K 5.4* 4.6 4.9  CL 105 102 101   CO2 20 19 19   BUN 31* 21 15  CREATININE 1.50* 1.40* 1.55*  CALCIUM 8.5 7.9* 7.5*  MG 1.4* 1.4* 1.4*  PHOS 2.8 2.8 3.4  GLUCOSE 126* 131* 130*    CBG (last 3)  No results found for this basename: GLUCAP,  in the last 72 hours  Scheduled Meds: . azaTHIOprine  100 mg Oral Daily  . feeding supplement  237 mL Oral TID BM  . heparin  5,000 Units Subcutaneous Q8H  . [START ON 09/19/2013] insulin aspart  0-9 Units Subcutaneous Q6H  . loperamide  2 mg Oral QID  . metoprolol  5 mg Intravenous Q6H  . multivitamin with minerals  1 tablet Oral Daily  . phosphorus  250 mg Oral Daily  . piperacillin-tazobactam (ZOSYN)  IV  3.375 g Intravenous Q8H  . predniSONE  5 mg Oral Daily  . sodium chloride  3 mL Intravenous Q12H  . tacrolimus  2 mg Sublingual BID  . vancomycin  1,250 mg Intravenous Q24H    Continuous Infusions: . sodium chloride 50 mL/hr at 09/18/13 1002  . sodium chloride    . Marland KitchenTPN (CLINIMIX-E) Adult     And  . fat emulsion      Ian Malkin RD, LDN Inpatient Clinical Dietitian Pager: [phone] After Hours Pager: [phone]

## 2013-09-18 NOTE — Progress Notes (Signed)
[facility] PROGRESS NOTE  Peter Clarke HYQ:[id number] DOB: [birth year] DOA: 09/07/2013 PCP: [name], MD  Assessment/Plan:  1. Diverticulitis with perforation  -care as per Gen Surgery - now s/p laparoscopic assisted Hartman's procedure (sigmoid colectomy with end colostomy) w/ lysis of adhesions.  -stable surgically. -Antibiotics broad to vancomycin and zosyn on 10-01 due to spike fever.   2. History of heart transplant/chronic immunosuppression/history of Chronic Systolic heart failure/New grade 2 DD  -care as per CHF Team  -ECHO this admit showed grade 2 DD.  -Cardiology has been periodically updating transplant surgeon at [facility].  -Patient's CHF stable  -Patient on Prograf, increase to 2 mg sub BID.  -Will defer lasix to Cardiologist. Chest x ray with pleural effusion.   3-Delirium: post surgery. May be related to fever. Improving.   4-fever: antibiotics broad to vancomycin and zosyn. Chest x tay atelectasis. Will cover for infection. No need for CT abdomen per surgery. c diff negative. Blood culture pending.   5. Head and neck cancer  -tx'd at [place]  -Imuran dose decreased to 100 mg Daily in August when dx'd with throat cancer.  -continue home steroid dose   6. Hypomagnesemia  -Mg at 1.4, replete IV.   7. Hypophosphatemia; stable. K-Phos daily.   8. HTN (hypertension)  -BP within AHA guidelines.   9. CKD (chronic kidney disease) stage 3, GFR 30-59 ml/min  -cr slightly increase to 1.5. Monitor. Continue with IV fluids.   10. Malnutrition;  -order TPN.   11-Hyponatremia; continue with IV fluids.   Code Status: Full Code.  Family Communication: care discussed with patient.  Disposition Plan: remain in patient. Will ask LTAC for evaluation.    Consultants: Cardiology  General surgery   Procedures: 2-D echocardiogram  - Left ventricle: The cavity size was normal. Wall thickness was normal. Systolic function was normal. The estimated ejection  fraction was in the range of 60% to 65%. Wall motion was normal; there were no regional wall motion abnormalities. Features are consistent with a pseudonormal left ventricular filling pattern, with concomitant abnormal relaxation and increased filling pressure (grade 2 diastolic dysfunction). - Aortic valve: There was no stenosis. - Mitral valve: Trivial regurgitation. - Left atrium: The atrium was mildly dilated. - Right ventricle: Poorly visualized. The cavity size was normal. Systolic function was normal. - Right atrium: The atrium was mildly dilated. - Pulmonary arteries: No complete TR doppler jet so unable to estimate PA systolic pressure. - Systemic veins: IVC was not visualized.  09/07/2013 perforated sigmoid diverticulitis; S/P Laparoscopic assisted Hartman's procedure (sigmoid colectomy with end colostomy), lysis of adhesions x 1 hr by Dr. Gaynelle Adu (surgeon)   Antibiotics:    HPI/Subjective: Denies dyspnea. Still confuse.   Objective: Filed Vitals:   09/18/13 0544  BP: 136/64  Pulse: 93  Temp: 99.3 F (37.4 C)  Resp: 20    Intake/Output Summary (Last 24 hours) at 09/18/13 1227 Last data filed at 09/18/13 0951  Gross per 24 hour  Intake 7046.67 ml  Output   2600 ml  Net 4446.67 ml   Filed Weights   09/07/13 1845 09/11/13 0400 09/11/13 1506  Weight: 79.6 kg (175 lb 7.8 oz) 82.8 kg (182 lb 8.7 oz) 84 kg (185 lb 3 oz)    Exam:   General:  No distress.   Cardiovascular: S 1, S 2 RRR  Respiratory: CTA  Abdomen: BS present, soft, clean dressing. Ostomy in place.   Musculoskeletal: no edema.   Data Reviewed: Basic Metabolic Panel:  Recent  Labs Lab 09/14/13 0412 09/14/13 1540 09/15/13 0537 09/16/13 0720 09/17/13 0535 09/18/13 0612  NA 135  --  134* 135 131* 129*  K 3.7 4.1 4.5 5.4* 4.6 4.9  CL 101  --  102 105 102 101  CO2 23  --  20 20 19 19   GLUCOSE 106*  --  127* 126* 131* 130*  BUN 50*  --  42* 31* 21 15  CREATININE 1.63*  --  1.43*  1.50* 1.40* 1.55*  CALCIUM 8.0*  --  8.1* 8.5 7.9* 7.5*  MG 2.0  --  1.7 1.4* 1.4* 1.4*  PHOS 1.9*  --  2.9 2.8 2.8 3.4   Liver Function Tests:  Recent Labs Lab 09/14/13 0412 09/15/13 0537 09/16/13 0720 09/17/13 0535 09/18/13 0612  AST 72* 56* 74* 68* 42*  ALT 43 40 48 54* 41  ALKPHOS 75 73 81 77 73  BILITOT 4.3* 3.1* 2.3* 1.7* 1.8*  PROT 5.3* 5.6* 5.7* 5.5* 5.1*  ALBUMIN 1.9* 1.8* 1.9* 1.8* 1.6*   No results found for this basename: LIPASE, AMYLASE,  in the last 168 hours  Recent Labs Lab 09/17/13 1829  AMMONIA 29   CBC:  Recent Labs Lab 09/14/13 0412 09/15/13 0537 09/16/13 0720 09/17/13 0535 09/18/13 0612  WBC 10.3 9.6 9.6 7.7 8.3  NEUTROABS 9.2* 8.4* 8.3* 6.7 7.0  HGB 9.7* 9.3* 8.7* 8.1* 7.6*  HCT 28.1* 27.4* 25.2* 23.4* 22.3*  MCV 96.2 98.6 97.3 97.5 98.2  PLT 228 269 315 333 375   Cardiac Enzymes: No results found for this basename: CKTOTAL, CKMB, CKMBINDEX, TROPONINI,  in the last 168 hours BNP (last 3 results) No results found for this basename: PROBNP,  in the last 8760 hours CBG: No results found for this basename: GLUCAP,  in the last 168 hours  Recent Results (from the past 240 hour(s))  CLOSTRIDIUM DIFFICILE BY PCR     Status: None   Collection Time    09/17/13  6:59 PM      Result Value Range Status   C difficile by pcr NEGATIVE  NEGATIVE Final     Studies: Dg Chest 2 View  09/17/2013   *RADIOLOGY REPORT*  Clinical Data: Fever.  Hypertension.  CHEST - 2 VIEW  Comparison: Chest radiograph 09/09/2013.  Findings: Low lung volumes.  Stable cardiac and mediastinal contours status post median sternotomy.  Redemonstrated fractured superior sternotomy wire.  Bilateral lower lung heterogeneous pulmonary opacities.  Small bilateral pleural effusions.  Regional skeleton is unremarkable.  IMPRESSION: Small bilateral pleural effusions.  Low lung volumes.  Bibasilar opacities likely represent atelectasis.  Infection not excluded.   Original Report  Authenticated By: Annia Belt, M.D   Ct Head Wo Contrast  09/17/2013   CLINICAL DATA:  Confusion  EXAM: CT HEAD WITHOUT CONTRAST  TECHNIQUE: Contiguous axial images were obtained from the base of the skull through the vertex without intravenous contrast.  COMPARISON:  CT head without contrast 08/20/2013  FINDINGS: No acute cortical infarct, hemorrhage, or mass lesion is present. The ventricles are of normal size. No significant extra-axial fluid collection is present.  A fluid level is again seen within the right maxillary sinus. The remaining paranasal sinuses and the mastoid air cells are clear. The osseous skull is intact.  IMPRESSION: 1. Normal CT appearance of the brain. 2. Right maxillary sinusitis.   Electronically Signed   By: Gennette Pac   On: 09/17/2013 20:34    Scheduled Meds: . azaTHIOprine  100 mg Oral Daily  . feeding  supplement  237 mL Oral TID BM  . heparin  5,000 Units Subcutaneous Q8H  . [START ON 09/19/2013] insulin aspart  0-9 Units Subcutaneous Q6H  . loperamide  2 mg Oral QID  . metoprolol  5 mg Intravenous Q6H  . multivitamin with minerals  1 tablet Oral Daily  . phosphorus  250 mg Oral Daily  . piperacillin-tazobactam (ZOSYN)  IV  3.375 g Intravenous Q8H  . predniSONE  5 mg Oral Daily  . sodium chloride  3 mL Intravenous Q12H  . tacrolimus  2 mg Sublingual BID  . vancomycin  1,250 mg Intravenous Q24H   Continuous Infusions: . sodium chloride 50 mL/hr at 09/18/13 1002  . sodium chloride    . Marland KitchenTPN (CLINIMIX-E) Adult     And  . fat emulsion      Principal Problem:   HTN (hypertension) Active Problems:   History of heart transplant/chronic immunosupression   Head and neck cancer   Diverticulitis with perforation   Chronic diastolic heart failure   Pre-operative cardiovascular examination   Fever   Leukocytosis   CKD (chronic kidney disease) stage 3, GFR 30-59 ml/min   Malnutrition of moderate degree   Hypomagnesemia   Quality of life palliative care  encounter    Time spent: 25 minutes.     REGALADO,BELKYS  [facility] Pager [phone]. If 7PM-7AM, please contact night-coverage at [email or web address], password [facility] 09/18/2013, 12:27 PM  LOS: 11 days

## 2013-09-18 NOTE — Progress Notes (Signed)
PARENTERAL NUTRITION CONSULT NOTE - INITIAL  Pharmacy Consult for TPN Indication: Unable to meet needs via po route  Allergies  Allergen Reactions  . Pepto-Bismol [Bismuth Subsalicylate]     rash    Patient Measurements: Height: 6' (182.9 cm) Weight: 185 lb 3 oz (84 kg) IBW/kg (Calculated) : 77.6 Usual Weight: 84 kg  Vital Signs: Temp: 99.3 F (37.4 C) (10/02 0544) Temp src: Oral (10/02 0544) BP: 136/64 mmHg (10/02 0544) Pulse Rate: 93 (10/02 0544) Intake/Output from previous day: 10/01 0701 - 10/02 0700 In: 7046.7 [P.O.:120; I.V.:6576.7; IV Piggyback:350] Out: 2675 [Urine:1750; Stool:925] Intake/Output from this shift:    Labs:  Recent Labs  09/16/13 0720 09/17/13 0535 09/18/13 0612  WBC 9.6 7.7 8.3  HGB 8.7* 8.1* 7.6*  HCT 25.2* 23.4* 22.3*  PLT 315 333 375     Recent Labs  09/16/13 0720 09/17/13 0535 09/18/13 0612  NA 135 131* 129*  K 5.4* 4.6 4.9  CL 105 102 101  CO2 20 19 19   GLUCOSE 126* 131* 130*  BUN 31* 21 15  CREATININE 1.50* 1.40* 1.55*  CALCIUM 8.5 7.9* 7.5*  MG 1.4* 1.4* 1.4*  PHOS 2.8 2.8 3.4  PROT 5.7* 5.5* 5.1*  ALBUMIN 1.9* 1.8* 1.6*  AST 74* 68* 42*  ALT 48 54* 41  ALKPHOS 81 77 73  BILITOT 2.3* 1.7* 1.8*   Estimated Creatinine Clearance: 50.8 ml/min (by C-G formula based on Cr of 1.55).   No results found for this basename: GLUCAP,  in the last 72 hours  Medical History: Past Medical History  Diagnosis Date  . Hypertension   . H/O heart transplant 2003  . Wears glasses   . Wears dentures     upper  . Renal insufficiency   . Cancer     tonsil  . Throat cancer     Medications:  Prescriptions prior to admission  Medication Sig Dispense Refill  . amLODipine (NORVASC) 5 MG tablet Take 5 mg by mouth daily.      Marland Kitchen azaTHIOprine (IMURAN) 50 MG tablet Take 100 mg by mouth daily.      . carvedilol (COREG) 25 MG tablet Take 25 mg by mouth 2 (two) times daily.       . Multiple Vitamins-Minerals (CENTRUM SILVER PO) Take 1  tablet by mouth daily.       . predniSONE (DELTASONE) 5 MG tablet Take 5 mg by mouth daily.       . tacrolimus (PROGRAF) 1 MG capsule Take 4 mg by mouth daily.         Insulin Requirements in the past 24 hours:  None  Current Nutrition:  Dysphagia diet + Ensure supplements TID (Pt taking very little - ~500 kcal; 14 gm protein daily per kcal ct results 9/29) MIVF: NS at 125 ml/hr  Nutritional Goals:  2000-2200 kCal, 100-110 grams of protein per day  Assessment: 67 y.o. male s/p Hartman's procedure (sigmoid colectomy with end colostomy on 9/23 for perforated sigmoid diverticulitis). Pt with estimated po intake <75% of estimated needs for ~3 weeks PTA. Pt diet advanced to dysphagia but remains unable to meet needs with po intake due to very poor appetite. Palliative care also seeing patient. Plan to start TPN for short-term nutritional support - pt continues with some amount of delirium so unable to make decisions on his own for now. If long-term nutritional support needed, may need to see if PEG tube is an option.   GI: Po intake remains poor. Kcal ct done  9/29 shows po intake providing ~500 kcal, 14 gm protein. Ostomy o/p  925/24h - noted trending down - pt on loperamide QID. Pt appears to have functioning GI tract so TPN not best option for this patient, especially for long-term nutrition support. Baseline prealbumin 6.8 (low).  Endo: No h/o DM. Glucoses on BMETs <150. Will begin empiric SSI when TPN starts  Lytes: Mg 1.4 - will replace with 2 gm IVPB, Na 129 - ?due to fluid yesterday - pt +>4L yesterday. MIVF was D51/2NS at 200 ml/hr -  MD changed to NS at 50 ml/hr this a.m. K 4.9 (?tacro contributing to increasing K), Phos 3.4. On Kphos tablets 250mg  daily now (provides phos, 1.1 mEq K). Ca corrects to 9.4. Pt at moderate risk of refeeding with poor po intake >3 weeks; however pt with K trending up so may need to take lytes out of TPN tomorrow.  Renal: CKD stage 3, SCr 1.55 (relatively  stable). UOP 0.9 ml/kg/hr.  Pulm: RA  Cards: s/p heart transplant [facility] 2003); continues on immunosuppressive therapy with tacro, azathioprine, prednisone; HTN. ECHO shows diastolic dysfunction. Metoprolol IV.  Hepatobil: LFTS wnl  Neuro: Pt with confusion (likely delirium) so unable to participate in goals of care meeting.   ID: Abx broadened to Vanc/Zosyn 10/1. Pt on ertapenem 9/21->10/1. Tm 100.2. Wbc wnl.Cx ngtd.  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 Hgb down to 7.6, plt wnl.  Best Practices: SQ heparin  TPN Access: PICC line to be placed 10/2  TPN day#: 0  Plan:  1. Begin Clinimix E 5/15  at 40 ml/hr and lipids at 63ml/hr. Will advance to goal of 90 ml/hr as tolerated. (Goal rate: Clinimix 5/15 at 90 ml/hr and lipids at 67ml/hr will provide 108 gm protein; 2013 kcal). 2. Decrease MIVF to [facility] when TPN hangs at 1800 3. Magnesium sulfate 2gm IVPB 4. Inititate empiric sensitive SSI q6h when TPN starts 5. Will f/u TPN labs, RD recommendations. 6. No multivitamin or TE in TPN as pt taking po MVI  Christoper Fabian, PharmD, BCPS Clinical pharmacist, pager [phone] 09/18/2013,7:33 AM

## 2013-09-18 NOTE — Progress Notes (Signed)
Peripherally Inserted Central Catheter/Midline Placement  The IV Nurse has discussed with the patient and/or persons authorized to consent for the patient, the purpose of this procedure and the potential benefits and risks involved with this procedure.  The benefits include less needle sticks, lab draws from the catheter and patient may be discharged home with the catheter.  Risks include, but not limited to, infection, bleeding, blood clot (thrombus formation), and puncture of an artery; nerve damage and irregular heat beat.  Alternatives to this procedure were also discussed. Wife present and both consented.  PICC/Midline Placement Documentation        Mellissa Kohut 09/18/2013, 8:27 AM

## 2013-09-18 NOTE — Progress Notes (Signed)
Poor energy Awake, alert Hgb 7.6 - anemia of chronic disease Will transfuse 2u PRBC to get Hgb above 10.  Hopefully will help with energy and maybe appetite.  Discussed benefits and risks of transfusion with the patient and his daughter.  Wilmon Arms. Corliss Skains, MD, [facility] Surgery  General/ Trauma Surgery  09/18/2013 11:08 AM

## 2013-09-18 NOTE — Progress Notes (Signed)
Patient ZO:XWRUE [name]      DOB: 1946/11/26      AVW:[id number]   Palliative Medicine Team at [facility] Progress Note    Subjective: Patient slightly more coherent. Not talking as fast but still confused.  No further rigors per patient.  Still no appetite. Going to get blood and starting TPN.  Filed Vitals:   09/18/13 0544  BP: 136/64  Pulse: 93  Temp: 99.3 F (37.4 C)  Resp: 20   Physical exam:  General:  Slight less toxic looking, still tangential PERRL, EOMI, anicteric,  Thrush up under gum line Chest : decreased but clear Abd: minimally tender not distended Ext: Warm, cachectic Neuro: confused but less hypomanic  Na: 129, Bun 15, Crt 1.55 , AST 42 WBC: 8.3 , hgb 7.6  Cdiff: negative  Assessment and plan: 67 yr old admitted with diverticular perforation postop repair with ostomy.  Course complicated by delirium and new fever. Antibiotics broadened. Family desires to try to improve status including cognition to allow patient to participate in goals of care.  Plan to start TPN and replace blood. Will continue to follow and help with goals.  1.  Full code  2.  Thrush: start Nystatin Swish and spit  3.  Pain: currently controlled  4.  Fever: antibiotics broadened. Monitor    Total Time 20 min   [name], MD MBA The Palliative Medicine Team at [facility] Phone: [phone] Pager: [phone]

## 2013-09-18 NOTE — Care Management Note (Signed)
  Page 1 of 1   09/18/2013     10:45:43 AM   CARE MANAGEMENT NOTE 09/18/2013  Patient:  Peter Clarke, Peter Clarke   Account Number:  [id number]  Date Initiated:  09/08/2013  Documentation initiated by:  Donn Pierini  Subjective/Objective Assessment:   Pt admitted with Sigmoid diverticulitis with focal perforation     Action/Plan:   PTA pt lived at home with spouse- NCM to follow pt progression for d/c needs   Anticipated DC Date:     Anticipated DC Plan:  SKILLED NURSING FACILITY  In-house referral  Clinical Social Worker      DC Planning Services  CM consult      Choice offered to / List presented to:             Status of service:  In process, will continue to follow Medicare Important Message given?   (If response is "NO", the following Medicare IM given date fields will be blank) Date Medicare IM given:   Date Additional Medicare IM given:    Discharge Disposition:    Per UR Regulation:  Reviewed for med. necessity/level of care/duration of stay  If discussed at Long Length of Stay Meetings, dates discussed:    Comments:  09-18-13 Recommended LTAC referral in LOS meeting today . Spoke with patient , daughter and wife at bedside regarding LTAC . At present they are declining LTAC , may reconsider after talking to palliative care again today . [name] RN BSN [phone]    09/09/13- 1230- Donn Pierini RN, BSN [phone] pt for sigmoid colectomy with colostomy (hartman's procedure) today- will cont. to follow

## 2013-09-18 NOTE — Progress Notes (Signed)
Patient evaluated for community based chronic disease management services with [facility] Care Management Program as a benefit of patient's [employer].  Spoke with [name] RNCM regarding current status.  It has been decided that we will engage the patient's wife at a later date because the wife is feeling overwhelmed by the discharging planning process.  Will continue to monitor.  Of note, [facility] Care Management services does not replace or interfere with any services that are arranged by inpatient case management or social work.  For additional questions or referrals please contact Anibal Henderson BSN RN [facility] Liaison at [phone].

## 2013-09-18 NOTE — Progress Notes (Signed)
9 Days Post-Op  Subjective: Pt says he's having a rough day.  He just had his PICC placed.  Pt c/o abdominal pain over midline wound.  No N/V.  Tolerating minimal amounts of PO food intake.  PT on board.  Patient states desire to have TPN to help him get stronger.  Seems more alert today and less confused than yesterday.  Objective: Vital signs in last 24 hours: Temp:  [98.3 F (36.8 C)-100.2 F (37.9 C)] 99.3 F (37.4 C) (10/02 0544) Pulse Rate:  [93-105] 93 (10/02 0544) Resp:  [20] 20 (10/02 0544) BP: (124-141)/(64-77) 136/64 mmHg (10/02 0544) SpO2:  [95 %-97 %] 95 % (10/02 0544) Last BM Date: 09/17/13  Intake/Output from previous day: 10/01 0701 - 10/02 0700 In: 7046.7 [P.O.:120; I.V.:6576.7; IV Piggyback:350] Out: 2675 [Urine:1750; Stool:925] Intake/Output this shift:    PE: Gen:  Alert, NAD, pleasant Abd: Soft, moderate tenderness over midline wound, +BS, wound is clean with minimal slough, colostomy pink & output is very loose and green, but they have documented less in the last 24 hours compared to yesterday   Lab Results:   Recent Labs  09/17/13 0535 09/18/13 0612  WBC 7.7 8.3  HGB 8.1* 7.6*  HCT 23.4* 22.3*  PLT 333 375   BMET  Recent Labs  09/17/13 0535 09/18/13 0612  NA 131* 129*  K 4.6 4.9  CL 102 101  CO2 19 19  GLUCOSE 131* 130*  BUN 21 15  CREATININE 1.40* 1.55*  CALCIUM 7.9* 7.5*   PT/INR No results found for this basename: LABPROT, INR,  in the last 72 hours CMP     Component Value Date/Time   NA 129* 09/18/2013 0612   K 4.9 09/18/2013 0612   CL 101 09/18/2013 0612   CO2 19 09/18/2013 0612   GLUCOSE 130* 09/18/2013 0612   BUN 15 09/18/2013 0612   CREATININE 1.55* 09/18/2013 0612   CREATININE 1.99* 06/14/2013 0825   CALCIUM 7.5* 09/18/2013 0612   PROT 5.1* 09/18/2013 0612   ALBUMIN 1.6* 09/18/2013 0612   AST 42* 09/18/2013 0612   ALT 41 09/18/2013 0612   ALKPHOS 73 09/18/2013 0612   BILITOT 1.8* 09/18/2013 0612   GFRNONAA 45* 09/18/2013 0612    GFRAA 52* 09/18/2013 0612   Lipase  No results found for this basename: lipase       Studies/Results: Dg Chest 2 View  09/17/2013   *RADIOLOGY REPORT*  Clinical Data: Fever.  Hypertension.  CHEST - 2 VIEW  Comparison: Chest radiograph 09/09/2013.  Findings: Low lung volumes.  Stable cardiac and mediastinal contours status post median sternotomy.  Redemonstrated fractured superior sternotomy wire.  Bilateral lower lung heterogeneous pulmonary opacities.  Small bilateral pleural effusions.  Regional skeleton is unremarkable.  IMPRESSION: Small bilateral pleural effusions.  Low lung volumes.  Bibasilar opacities likely represent atelectasis.  Infection not excluded.   Original Report Authenticated By: Annia Belt, M.D   Ct Head Wo Contrast  09/17/2013   CLINICAL DATA:  Confusion  EXAM: CT HEAD WITHOUT CONTRAST  TECHNIQUE: Contiguous axial images were obtained from the base of the skull through the vertex without intravenous contrast.  COMPARISON:  CT head without contrast 08/20/2013  FINDINGS: No acute cortical infarct, hemorrhage, or mass lesion is present. The ventricles are of normal size. No significant extra-axial fluid collection is present.  A fluid level is again seen within the right maxillary sinus. The remaining paranasal sinuses and the mastoid air cells are clear. The osseous skull is intact.  IMPRESSION:  1. Normal CT appearance of the brain. 2. Right maxillary sinusitis.   Electronically Signed   By: Gennette Pac   On: 09/17/2013 20:34    Anti-infectives: Anti-infectives   Start     Dose/Rate Route Frequency Ordered Stop   09/17/13 1800  piperacillin-tazobactam (ZOSYN) IVPB 3.375 g     3.375 g 12.5 mL/hr over 240 Minutes Intravenous Every 8 hours 09/17/13 1725     09/17/13 1800  vancomycin (VANCOCIN) 1,250 mg in sodium chloride 0.9 % 250 mL IVPB     1,250 mg 166.7 mL/hr over 90 Minutes Intravenous Every 24 hours 09/17/13 1725     09/07/13 2000  ertapenem (INVANZ) 1 g in sodium  chloride 0.9 % 50 mL IVPB  Status:  Discontinued     1 g 100 mL/hr over 30 Minutes Intravenous Every 24 hours 09/07/13 1914 09/17/13 1715   09/07/13 1430  piperacillin-tazobactam (ZOSYN) IVPB 3.375 g     3.375 g 12.5 mL/hr over 240 Minutes Intravenous  Once 09/07/13 1425 09/07/13 1516   09/07/13 1430  metroNIDAZOLE (FLAGYL) IVPB 500 mg     500 mg 100 mL/hr over 60 Minutes Intravenous  Once 09/07/13 1425 09/07/13 1617       Assessment/Plan Diverticulitis POD #9 S/p Hartmans, LOA (Dr. [name] 09/10/13)  Leukocytosis - resolved and afebrile PCM now on TPN Acute on chronic anemia - likely dilutional, but symptomatic   Bilateral pleural effusions/atelectasis Diastolic heart failure  CKD  HTN  Head and neck cancer  S/p heart transplant    Plan: 1.  Soft diet, continue with ensure TID, pt can eat as tolerated 2.  Palliative meeting made the patient a full code for now, to start PICC/TPN - follow up meeting today 3.  Less ostomy output with lmodium -->922ml 4.  Red IVF to 50 likely cause of change in Hgb today 5.  Routine ostomy and wound care 6.  Seeing as he is afebrile and no leukocytosis would not recommend a repeat CT scan at this point.  7.  Aggressive pulm toilet and mobilization 8.  SCD's and heparin 9.  Transfuse 2 units pRBC's    LOS: 11 days    Peter Clarke, Peter Clarke 09/18/2013, 8:17 AM Pager: [phone]

## 2013-09-18 NOTE — Progress Notes (Signed)
. Advanced Heart Failure Team Consult Note  Referring Physician: Dr. [name] ([facility]) Primary Cardiologist:  Dr. Paulino Rily [facility])  Reason for Consultation: Pre-operative evaluation  HPI:    Peter Clarke is  67 year old male with h/o systolic HF due to NICM s/p OHTx at [facility] in 2003, recent diagnosis of tonsillar head and neck cancer with active chemotherapy/XRT at [facility], chronic renal failure who was admitted with sigmoid diverticulitis with focal perforation.  S/p partial colectomy on 9/23 with lysis of adhesions.   Remains confused. Poor appetite. Getting blood today. On broad spectrum abx for recent fever. WBC 8.4 with left shift. Tacrolimus level 8.6  Objective:    Vital Signs:   Temp:  [98.3 F (36.8 C)-99.3 F (37.4 C)] 99.3 F (37.4 C) (10/02 1705) Pulse Rate:  [93-109] 97 (10/02 1705) Resp:  [16-20] 16 (10/02 1705) BP: (109-138)/(64-76) 109/71 mmHg (10/02 1705) SpO2:  [90 %-98 %] 94 % (10/02 1705) Weight:  [81.3 kg (179 lb 3.7 oz)] 81.3 kg (179 lb 3.7 oz) (10/02 1547) Last BM Date: 09/17/13  Weight change: Filed Weights   09/11/13 0400 09/11/13 1506 09/18/13 1547  Weight: 82.8 kg (182 lb 8.7 oz) 84 kg (185 lb 3 oz) 81.3 kg (179 lb 3.7 oz)    Intake/Output:   Intake/Output Summary (Last 24 hours) at 09/18/13 1804 Last data filed at 09/18/13 1650  Gross per 24 hour  Intake 1792.5 ml  Output   2250 ml  Net -457.5 ml     Physical Exam: General:  Sitting in bed. No distress. Mildly diaphoretic HEENT: normal Neck: supple. JVP 6 . Carotids 2+ bilat; no bruits. No lymphadenopathy or thryomegaly appreciated. Cor: PMI nondisplaced. Regular rate & rhythm. No rubs, gallops or murmurs. Lungs: clear Abdomen: soft, tender to palpation. + colostomy.  Extremities: no cyanosis, clubbing, rash, edema Neuro: alert & confused, cranial nerves grossly intact. moves all 4 extremities w/o difficulty. Affect pleasant  Telemetry: SR  95  Labs: Basic Metabolic Panel:  Recent Labs Lab 09/14/13 0412 09/14/13 1540 09/15/13 0537 09/16/13 0720 09/17/13 0535 09/18/13 0612  NA 135  --  134* 135 131* 129*  K 3.7 4.1 4.5 5.4* 4.6 4.9  CL 101  --  102 105 102 101  CO2 23  --  20 20 19 19   GLUCOSE 106*  --  127* 126* 131* 130*  BUN 50*  --  42* 31* 21 15  CREATININE 1.63*  --  1.43* 1.50* 1.40* 1.55*  CALCIUM 8.0*  --  8.1* 8.5 7.9* 7.5*  MG 2.0  --  1.7 1.4* 1.4* 1.4*  PHOS 1.9*  --  2.9 2.8 2.8 3.4    Liver Function Tests:  Recent Labs Lab 09/14/13 0412 09/15/13 0537 09/16/13 0720 09/17/13 0535 09/18/13 0612  AST 72* 56* 74* 68* 42*  ALT 43 40 48 54* 41  ALKPHOS 75 73 81 77 73  BILITOT 4.3* 3.1* 2.3* 1.7* 1.8*  PROT 5.3* 5.6* 5.7* 5.5* 5.1*  ALBUMIN 1.9* 1.8* 1.9* 1.8* 1.6*   No results found for this basename: LIPASE, AMYLASE,  in the last 168 hours  Recent Labs Lab 09/17/13 1829  AMMONIA 29    CBC:  Recent Labs Lab 09/14/13 0412 09/15/13 0537 09/16/13 0720 09/17/13 0535 09/18/13 0612  WBC 10.3 9.6 9.6 7.7 8.3  NEUTROABS 9.2* 8.4* 8.3* 6.7 7.0  HGB 9.7* 9.3* 8.7* 8.1* 7.6*  HCT 28.1* 27.4* 25.2* 23.4* 22.3*  MCV 96.2 98.6 97.3 97.5 98.2  PLT 228  269 315 333 375    Cardiac Enzymes: No results found for this basename: CKTOTAL, CKMB, CKMBINDEX, TROPONINI,  in the last 168 hours  BNP: BNP (last 3 results) No results found for this basename: PROBNP,  in the last 8760 hours  CBG: No results found for this basename: GLUCAP,  in the last 168 hours  Coagulation Studies: No results found for this basename: LABPROT, INR,  in the last 72 hours Tacrolimus level: 6   Imaging: Dg Chest 2 View  09/17/2013   *RADIOLOGY REPORT*  Clinical Data: Fever.  Hypertension.  CHEST - 2 VIEW  Comparison: Chest radiograph 09/09/2013.  Findings: Low lung volumes.  Stable cardiac and mediastinal contours status post median sternotomy.  Redemonstrated fractured superior sternotomy wire.  Bilateral lower  lung heterogeneous pulmonary opacities.  Small bilateral pleural effusions.  Regional skeleton is unremarkable.  IMPRESSION: Small bilateral pleural effusions.  Low lung volumes.  Bibasilar opacities likely represent atelectasis.  Infection not excluded.   Original Report Authenticated By: Annia Belt, M.D   Ct Head Wo Contrast  09/17/2013   CLINICAL DATA:  Confusion  EXAM: CT HEAD WITHOUT CONTRAST  TECHNIQUE: Contiguous axial images were obtained from the base of the skull through the vertex without intravenous contrast.  COMPARISON:  CT head without contrast 08/20/2013  FINDINGS: No acute cortical infarct, hemorrhage, or mass lesion is present. The ventricles are of normal size. No significant extra-axial fluid collection is present.  A fluid level is again seen within the right maxillary sinus. The remaining paranasal sinuses and the mastoid air cells are clear. The osseous skull is intact.  IMPRESSION: 1. Normal CT appearance of the brain. 2. Right maxillary sinusitis.   Electronically Signed   By: Gennette Pac   On: 09/17/2013 20:34     Medications:     Current Medications: . azaTHIOprine  100 mg Oral Daily  . feeding supplement  237 mL Oral TID BM  . heparin  5,000 Units Subcutaneous Q8H  . [START ON 09/19/2013] insulin aspart  0-9 Units Subcutaneous Q6H  . loperamide  2 mg Oral QID  . metoprolol  5 mg Intravenous Q6H  . multivitamin with minerals  1 tablet Oral Daily  . piperacillin-tazobactam (ZOSYN)  IV  3.375 g Intravenous Q8H  . predniSONE  5 mg Oral Daily  . sodium chloride  3 mL Intravenous Q12H  . tacrolimus  2 mg Sublingual BID  . vancomycin  1,250 mg Intravenous Q24H    Infusions: . sodium chloride    . Marland KitchenTPN (CLINIMIX-E) Adult     And  . fat emulsion       Assessment:   1. Acute diverticulitis with perforation s/p partial colectomy 2. Chronic systolic HF s/p OHTx in 2003 ([name]) 3. Tonsillar cancer - currently undergoing Chemo/XRT 4.  chronic renal  failure 5. Malnutrition, severe, albumin 1.6 6. Acute delirium 7. hyponatremia  Plan/Discussion:    Tacrolimus level is at goal. No HF.  Continues to struggle otherwise. Agree with broad spectrum abx. Would have low threshold to add fungal coverage. Will recheck tacrolimus level on Monday. Can consider echo next week.   Shailynn Fong,MD 6:04 PM Length of Stay: 11 Advanced Heart Failure Team Pager [phone] (M-F; 7a - 4p)  Please contact [facility] for night-coverage after hours (4p -7a ) and weekends on [email or web address]

## 2013-09-19 ENCOUNTER — Inpatient Hospital Stay (HOSPITAL_COMMUNITY): Payer: Medicare Other

## 2013-09-19 DIAGNOSIS — D649 Anemia, unspecified: Secondary | ICD-10-CM

## 2013-09-19 DIAGNOSIS — R509 Fever, unspecified: Secondary | ICD-10-CM

## 2013-09-19 DIAGNOSIS — E440 Moderate protein-calorie malnutrition: Secondary | ICD-10-CM

## 2013-09-19 DIAGNOSIS — Z515 Encounter for palliative care: Secondary | ICD-10-CM

## 2013-09-19 LAB — COMPREHENSIVE METABOLIC PANEL
Albumin: 1.9 g/dL — ABNORMAL LOW (ref 3.5–5.2)
Alkaline Phosphatase: 76 U/L (ref 39–117)
BUN: 18 mg/dL (ref 6–23)
Calcium: 8.2 mg/dL — ABNORMAL LOW (ref 8.4–10.5)
Chloride: 99 mEq/L (ref 96–112)
Creatinine, Ser: 1.65 mg/dL — ABNORMAL HIGH (ref 0.50–1.35)
Glucose, Bld: 116 mg/dL — ABNORMAL HIGH (ref 70–99)
Total Bilirubin: 1.9 mg/dL — ABNORMAL HIGH (ref 0.3–1.2)
Total Protein: 5.8 g/dL — ABNORMAL LOW (ref 6.0–8.3)

## 2013-09-19 LAB — CBC WITH DIFFERENTIAL/PLATELET
Basophils Absolute: 0 10*3/uL (ref 0.0–0.1)
Basophils Relative: 0 % (ref 0–1)
Eosinophils Absolute: 0 10*3/uL (ref 0.0–0.7)
Eosinophils Relative: 0 % (ref 0–5)
HCT: 28.4 % — ABNORMAL LOW (ref 39.0–52.0)
Hemoglobin: 9.8 g/dL — ABNORMAL LOW (ref 13.0–17.0)
Lymphs Abs: 0.6 10*3/uL — ABNORMAL LOW (ref 0.7–4.0)
MCH: 32.5 pg (ref 26.0–34.0)
MCHC: 34.5 g/dL (ref 30.0–36.0)
Monocytes Absolute: 0.6 10*3/uL (ref 0.1–1.0)
Monocytes Relative: 7 % (ref 3–12)
Neutro Abs: 8.3 10*3/uL — ABNORMAL HIGH (ref 1.7–7.7)
RDW: 15.6 % — ABNORMAL HIGH (ref 11.5–15.5)

## 2013-09-19 LAB — TYPE AND SCREEN
ABO/RH(D): O POS
Antibody Screen: NEGATIVE
Unit division: 0

## 2013-09-19 LAB — PHOSPHORUS: Phosphorus: 3.7 mg/dL (ref 2.3–4.6)

## 2013-09-19 LAB — GLUCOSE, CAPILLARY: Glucose-Capillary: 115 mg/dL — ABNORMAL HIGH (ref 70–99)

## 2013-09-19 LAB — MAGNESIUM: Magnesium: 1.7 mg/dL (ref 1.5–2.5)

## 2013-09-19 MED ORDER — OXYCODONE HCL 5 MG PO TABS
5.0000 mg | ORAL_TABLET | ORAL | Status: DC | PRN
  Administered 2013-09-19: 5 mg via ORAL
  Administered 2013-09-19: 10 mg via ORAL
  Administered 2013-09-20 (×2): 15 mg via ORAL
  Administered 2013-09-20: 10 mg via ORAL
  Administered 2013-09-21: 15 mg via ORAL
  Administered 2013-09-22: 10 mg via ORAL
  Filled 2013-09-19 (×2): qty 3
  Filled 2013-09-19: qty 2
  Filled 2013-09-19 (×2): qty 3
  Filled 2013-09-19: qty 1
  Filled 2013-09-19: qty 2

## 2013-09-19 MED ORDER — HYDROMORPHONE HCL PF 1 MG/ML IJ SOLN
1.0000 mg | INTRAMUSCULAR | Status: DC | PRN
  Administered 2013-09-22: 1 mg via INTRAVENOUS
  Filled 2013-09-19: qty 1

## 2013-09-19 MED ORDER — MAGNESIUM SULFATE IN D5W 10-5 MG/ML-% IV SOLN
1.0000 g | Freq: Once | INTRAVENOUS | Status: AC
  Administered 2013-09-19: 1 g via INTRAVENOUS
  Filled 2013-09-19: qty 100

## 2013-09-19 MED ORDER — CLINIMIX E/DEXTROSE (5/15) 5 % IV SOLN
INTRAVENOUS | Status: AC
  Administered 2013-09-19: 17:00:00 via INTRAVENOUS
  Filled 2013-09-19: qty 2000

## 2013-09-19 MED ORDER — FAT EMULSION 20 % IV EMUL
250.0000 mL | INTRAVENOUS | Status: AC
  Administered 2013-09-19: 250 mL via INTRAVENOUS
  Filled 2013-09-19: qty 250

## 2013-09-19 MED ORDER — IOHEXOL 300 MG/ML  SOLN
25.0000 mL | INTRAMUSCULAR | Status: AC
  Administered 2013-09-19: 25 mL via ORAL

## 2013-09-19 MED ORDER — SODIUM CHLORIDE 0.9 % IV SOLN
INTRAVENOUS | Status: DC
  Administered 2013-09-19: 50 mL/h via INTRAVENOUS
  Administered 2013-09-22: 22:00:00 via INTRAVENOUS

## 2013-09-19 NOTE — Progress Notes (Signed)
10 Days Post-Op  Subjective: Pt notes pain is improved from yesterday.  Color seems better and he appears more alert than yesterday.  No N/V.  Abdominal pain controlled with medication, but making him very out of it and sleepy.  Good ostomy output.  Tolerated only 1/2 Ensure this morning.  Appetite low.  Wife at bedside.  Using IS and at 1300.  Objective: Vital signs in last 24 hours: Temp:  [98.5 F (36.9 C)-101.2 F (38.4 C)] 101.2 F (38.4 C) (10/03 0538) Pulse Rate:  [92-109] 106 (10/03 0538) Resp:  [16-18] 18 (10/03 0538) BP: (109-138)/(66-83) 136/81 mmHg (10/03 0538) SpO2:  [90 %-98 %] 95 % (10/03 0538) Weight:  [179 lb 3.7 oz (81.3 kg)] 179 lb 3.7 oz (81.3 kg) (10/02 1547) Last BM Date: 09/18/13  Intake/Output from previous day: 10/02 0701 - 10/03 0700 In: 1294.5 [I.V.:590; Blood:377.5; TPN:327] Out: 2250 [Urine:900; Stool:1350] Intake/Output this shift:    PE: Gen:  Alert, NAD, pleasant Abd: Soft, moderate tenderness over midline wound (less than yesterday), +BS, wound is clean with minimal slough, colostomy pink & output is very loose and green   Lab Results:   Recent Labs  09/18/13 0612 09/19/13 0448  WBC 8.3 9.6  HGB 7.6* 9.8*  HCT 22.3* 28.4*  PLT 375 388   BMET  Recent Labs  09/18/13 0612 09/19/13 0448  NA 129* 130*  K 4.9 4.4  CL 101 99  CO2 19 19  GLUCOSE 130* 116*  BUN 15 18  CREATININE 1.55* 1.65*  CALCIUM 7.5* 8.2*   PT/INR No results found for this basename: LABPROT, INR,  in the last 72 hours CMP     Component Value Date/Time   NA 130* 09/19/2013 0448   K 4.4 09/19/2013 0448   CL 99 09/19/2013 0448   CO2 19 09/19/2013 0448   GLUCOSE 116* 09/19/2013 0448   BUN 18 09/19/2013 0448   CREATININE 1.65* 09/19/2013 0448   CREATININE 1.99* 06/14/2013 0825   CALCIUM 8.2* 09/19/2013 0448   PROT 5.8* 09/19/2013 0448   ALBUMIN 1.9* 09/19/2013 0448   AST 32 09/19/2013 0448   ALT 35 09/19/2013 0448   ALKPHOS 76 09/19/2013 0448   BILITOT 1.9* 09/19/2013  0448   GFRNONAA 41* 09/19/2013 0448   GFRAA 48* 09/19/2013 0448   Lipase  No results found for this basename: lipase       Studies/Results: Dg Chest 2 View  09/17/2013   *RADIOLOGY REPORT*  Clinical Data: Fever.  Hypertension.  CHEST - 2 VIEW  Comparison: Chest radiograph 09/09/2013.  Findings: Low lung volumes.  Stable cardiac and mediastinal contours status post median sternotomy.  Redemonstrated fractured superior sternotomy wire.  Bilateral lower lung heterogeneous pulmonary opacities.  Small bilateral pleural effusions.  Regional skeleton is unremarkable.  IMPRESSION: Small bilateral pleural effusions.  Low lung volumes.  Bibasilar opacities likely represent atelectasis.  Infection not excluded.   Original Report Authenticated By: Annia Belt, M.D   Ct Head Wo Contrast  09/17/2013   CLINICAL DATA:  Confusion  EXAM: CT HEAD WITHOUT CONTRAST  TECHNIQUE: Contiguous axial images were obtained from the base of the skull through the vertex without intravenous contrast.  COMPARISON:  CT head without contrast 08/20/2013  FINDINGS: No acute cortical infarct, hemorrhage, or mass lesion is present. The ventricles are of normal size. No significant extra-axial fluid collection is present.  A fluid level is again seen within the right maxillary sinus. The remaining paranasal sinuses and the mastoid air cells are clear. The  osseous skull is intact.  IMPRESSION: 1. Normal CT appearance of the brain. 2. Right maxillary sinusitis.   Electronically Signed   By: Gennette Pac   On: 09/17/2013 20:34    Anti-infectives: Anti-infectives   Start     Dose/Rate Route Frequency Ordered Stop   09/18/13 2300  micafungin (MYCAMINE) 100 mg in sodium chloride 0.9 % 100 mL IVPB     100 mg 100 mL/hr over 1 Hours Intravenous Daily at bedtime 09/18/13 2235     09/17/13 1800  piperacillin-tazobactam (ZOSYN) IVPB 3.375 g     3.375 g 12.5 mL/hr over 240 Minutes Intravenous Every 8 hours 09/17/13 1725     09/17/13 1800   vancomycin (VANCOCIN) 1,250 mg in sodium chloride 0.9 % 250 mL IVPB     1,250 mg 166.7 mL/hr over 90 Minutes Intravenous Every 24 hours 09/17/13 1725     09/07/13 2000  ertapenem (INVANZ) 1 g in sodium chloride 0.9 % 50 mL IVPB  Status:  Discontinued     1 g 100 mL/hr over 30 Minutes Intravenous Every 24 hours 09/07/13 1914 09/17/13 1715   09/07/13 1430  piperacillin-tazobactam (ZOSYN) IVPB 3.375 g     3.375 g 12.5 mL/hr over 240 Minutes Intravenous  Once 09/07/13 1425 09/07/13 1516   09/07/13 1430  metroNIDAZOLE (FLAGYL) IVPB 500 mg     500 mg 100 mL/hr over 60 Minutes Intravenous  Once 09/07/13 1425 09/07/13 1617       Assessment/Plan Diverticulitis POD #10 S/p Hartmans, LOA (Dr. 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 Soft diet, continue with ensure TID, pt can eat as tolerated  2. Palliative meeting made the patient a full code for now, continue PICC/TPN per family's preferences opposed to TF, no note from palliative re-visit yesterday 3. Less ostomy output with lmodium  4. Routine ostomy and wound care  5. Aggressive pulm toilet and mobilization - PT to work with patient today 6. SCD's and heparin  7. Continue to monitor WBC and watch vital signs, consider CT scan, but clinically doing better and has good colostomy output and less pain than yesterday.  Will order oral contrast CT abd/pelvis to look for abscess. 8. Switch to PO pain meds and IV for breakthrough only to reduce delerium     LOS: 12 days    Peter Clarke, Peter Clarke 09/19/2013, 8:33 AM Pager: [phone]

## 2013-09-19 NOTE — Progress Notes (Signed)
. Advanced Heart Failure Team Consult Note  Referring Physician: Dr. [name] ([facility]) Primary Cardiologist:  Dr. Paulino Rily [facility])  Reason for Consultation: Pre-operative evaluation  HPI:    Purl is  67 year old male with h/o systolic HF due to NICM s/p OHTx at [facility] in 2003, recent diagnosis of tonsillar head and neck cancer with active chemotherapy/XRT at [facility], chronic renal failure who was admitted with sigmoid diverticulitis with focal perforation.  S/p partial colectomy on 9/23 with lysis of adhesions.   Spiked temperature to 101.5 last night and was placed on anti-fungal. Hgb improved to 9.8 with 2 PRBCs. Remains confused. Poor appetite. Getting blood today. On broad spectrum abx for recent fever. WBC 8.4 with left shift. Tacrolimus level 8.6  Objective:    Vital Signs:   Temp:  [98.5 F (36.9 C)-101.2 F (38.4 C)] 101.2 F (38.4 C) (10/03 0538) Pulse Rate:  [92-109] 106 (10/03 0538) Resp:  [16-18] 18 (10/03 0538) BP: (109-138)/(66-83) 136/81 mmHg (10/03 0538) SpO2:  [90 %-98 %] 95 % (10/03 0538) Weight:  [179 lb 3.7 oz (81.3 kg)] 179 lb 3.7 oz (81.3 kg) (10/02 1547) Last BM Date: 09/18/13  Weight change: Filed Weights   09/11/13 0400 09/11/13 1506 09/18/13 1547  Weight: 182 lb 8.7 oz (82.8 kg) 185 lb 3 oz (84 kg) 179 lb 3.7 oz (81.3 kg)    Intake/Output:   Intake/Output Summary (Last 24 hours) at 09/19/13 1038 Last data filed at 09/19/13 0700  Gross per 24 hour  Intake 1294.5 ml  Output   1400 ml  Net -105.5 ml     Physical Exam: General:  Sitting in bed. No distress. Mildly diaphoretic HEENT: normal Neck: supple. JVP 6 . Carotids 2+ bilat; no bruits. No lymphadenopathy or thryomegaly appreciated. Cor: PMI nondisplaced. Regular rate & rhythm. No rubs, gallops or murmurs. Lungs: clear Abdomen: soft, tender to palpation. + colostomy.  Extremities: no cyanosis, clubbing, rash, edema Neuro: alert & confused,  cranial nerves grossly intact. moves all 4 extremities w/o difficulty. Affect pleasant  Telemetry: SR 95  Labs: Basic Metabolic Panel:  Recent Labs Lab 09/15/13 0537 09/16/13 0720 09/17/13 0535 09/18/13 0612 09/19/13 0448  NA 134* 135 131* 129* 130*  K 4.5 5.4* 4.6 4.9 4.4  CL 102 105 102 101 99  CO2 20 20 19 19 19   GLUCOSE 127* 126* 131* 130* 116*  BUN 42* 31* 21 15 18   CREATININE 1.43* 1.50* 1.40* 1.55* 1.65*  CALCIUM 8.1* 8.5 7.9* 7.5* 8.2*  MG 1.7 1.4* 1.4* 1.4* 1.7  PHOS 2.9 2.8 2.8 3.4 3.7    Liver Function Tests:  Recent Labs Lab 09/15/13 0537 09/16/13 0720 09/17/13 0535 09/18/13 0612 09/19/13 0448  AST 56* 74* 68* 42* 32  ALT 40 48 54* 41 35  ALKPHOS 73 81 77 73 76  BILITOT 3.1* 2.3* 1.7* 1.8* 1.9*  PROT 5.6* 5.7* 5.5* 5.1* 5.8*  ALBUMIN 1.8* 1.9* 1.8* 1.6* 1.9*   No results found for this basename: LIPASE, AMYLASE,  in the last 168 hours  Recent Labs Lab 09/17/13 1829  AMMONIA 29    CBC:  Recent Labs Lab 09/15/13 0537 09/16/13 0720 09/17/13 0535 09/18/13 0612 09/19/13 0448  WBC 9.6 9.6 7.7 8.3 9.6  NEUTROABS 8.4* 8.3* 6.7 7.0 8.3*  HGB 9.3* 8.7* 8.1* 7.6* 9.8*  HCT 27.4* 25.2* 23.4* 22.3* 28.4*  MCV 98.6 97.3 97.5 98.2 94.0  PLT 269 315 333 375 388    Cardiac Enzymes: No results found  for this basename: CKTOTAL, CKMB, CKMBINDEX, TROPONINI,  in the last 168 hours  BNP: BNP (last 3 results) No results found for this basename: PROBNP,  in the last 8760 hours  CBG:  Recent Labs Lab 09/19/13 0017 09/19/13 0647  GLUCAP 115* 128*    Coagulation Studies: No results found for this basename: LABPROT, INR,  in the last 72 hours Tacrolimus level: 6   Imaging: Dg Chest 2 View  09/17/2013   *RADIOLOGY REPORT*  Clinical Data: Fever.  Hypertension.  CHEST - 2 VIEW  Comparison: Chest radiograph 09/09/2013.  Findings: Low lung volumes.  Stable cardiac and mediastinal contours status post median sternotomy.  Redemonstrated fractured  superior sternotomy wire.  Bilateral lower lung heterogeneous pulmonary opacities.  Small bilateral pleural effusions.  Regional skeleton is unremarkable.  IMPRESSION: Small bilateral pleural effusions.  Low lung volumes.  Bibasilar opacities likely represent atelectasis.  Infection not excluded.   Original Report Authenticated By: Annia Belt, M.D   Ct Head Wo Contrast  09/17/2013   CLINICAL DATA:  Confusion  EXAM: CT HEAD WITHOUT CONTRAST  TECHNIQUE: Contiguous axial images were obtained from the base of the skull through the vertex without intravenous contrast.  COMPARISON:  CT head without contrast 08/20/2013  FINDINGS: No acute cortical infarct, hemorrhage, or mass lesion is present. The ventricles are of normal size. No significant extra-axial fluid collection is present.  A fluid level is again seen within the right maxillary sinus. The remaining paranasal sinuses and the mastoid air cells are clear. The osseous skull is intact.  IMPRESSION: 1. Normal CT appearance of the brain. 2. Right maxillary sinusitis.   Electronically Signed   By: Gennette Pac   On: 09/17/2013 20:34     Medications:     Current Medications: . azaTHIOprine  100 mg Oral Daily  . feeding supplement  237 mL Oral TID BM  . heparin  5,000 Units Subcutaneous Q8H  . insulin aspart  0-9 Units Subcutaneous Q6H  . loperamide  2 mg Oral QID  . magnesium sulfate 1 - 4 g bolus IVPB  1 g Intravenous Once  . metoprolol  5 mg Intravenous Q6H  . micafungin (MYCAMINE) IV  100 mg Intravenous QHS  . multivitamin with minerals  1 tablet Oral Daily  . nystatin  5 mL Oral QID  . piperacillin-tazobactam (ZOSYN)  IV  3.375 g Intravenous Q8H  . predniSONE  5 mg Oral Daily  . sodium chloride  3 mL Intravenous Q12H  . tacrolimus  2 mg Sublingual BID  . vancomycin  1,250 mg Intravenous Q24H    Infusions: . sodium chloride    . sodium chloride 50 mL/hr at 09/19/13 0951  . Marland KitchenTPN (CLINIMIX-E) Adult 40 mL/hr at 09/19/13 0023   And  .  fat emulsion 250 mL (09/19/13 0022)  . Marland KitchenTPN (CLINIMIX-E) Adult     And  . fat emulsion       Assessment:   1. Acute diverticulitis with perforation s/p partial colectomy 2. Chronic systolic HF s/p OHTx in 2003 ([name]) 3. Tonsillar cancer - currently undergoing Chemo/XRT 4.  chronic renal failure 5. Malnutrition, severe, albumin 1.6 6. Acute delirium 7. hyponatremia  Plan/Discussion:    Spiked a fever last night and micafungin started. Still is very jumbled in his thoughts and confused. Not eating well and had trouble taking Prograf this morning. He is on broad spectrum antibiotics and blood cx negative at this time. Cr stable and volume looks good. Last Tacrolimus level was  at goal 8.6, yesterday. Will recheck level on Monday.  No HF.  Echo 9/22 showed normal EF.    Ulla Potash B,MD 10:38 AM Length of Stay: 12 Advanced Heart Failure Team Pager [phone] (M-F; 7a - 4p)  Please contact [facility] for night-coverage after hours (4p -7a ) and weekends on [email or web address]  Patient seen with NP, agree with the above.    Patient continues to have fevers on broad spectrum antibiotics.  Cultures negative so far.  He is immunosuppressed.  I think it would be a good idea to involve ID at this point.   Marca Ancona 09/19/2013 11:40 AM

## 2013-09-19 NOTE — Progress Notes (Signed)
PARENTERAL NUTRITION CONSULT NOTE:  FOLLOW-UP  Pharmacy Consult:  TPN Indication:  Unable to meet needs via PO route  Allergies  Allergen Reactions  . Pepto-Bismol [Bismuth Subsalicylate]     rash    Patient Measurements: Height: 6' (182.9 cm) Weight: 179 lb 3.7 oz (81.3 kg) IBW/kg (Calculated) : 77.6 Usual Weight: 84 kg  Vital Signs: Temp: 101.2 F (38.4 C) (10/03 0538) Temp src: Oral (10/03 0538) BP: 136/81 mmHg (10/03 0538) Pulse Rate: 106 (10/03 0538) Intake/Output from previous day: 10/02 0701 - 10/03 0700 In: 1294.5 [I.V.:590; Blood:377.5; TPN:327] Out: 2250 [Urine:900; Stool:1350]  Labs:  Recent Labs  09/17/13 0535 09/18/13 0612 09/19/13 0448  WBC 7.7 8.3 9.6  HGB 8.1* 7.6* 9.8*  HCT 23.4* 22.3* 28.4*  PLT 333 375 388     Recent Labs  09/17/13 0535 09/18/13 0612 09/19/13 0448  NA 131* 129* 130*  K 4.6 4.9 4.4  CL 102 101 99  CO2 19 19 19   GLUCOSE 131* 130* 116*  BUN 21 15 18   CREATININE 1.40* 1.55* 1.65*  CALCIUM 7.9* 7.5* 8.2*  MG 1.4* 1.4* 1.7  PHOS 2.8 3.4 3.7  PROT 5.5* 5.1* 5.8*  ALBUMIN 1.8* 1.6* 1.9*  AST 68* 42* 32  ALT 54* 41 35  ALKPHOS 77 73 76  BILITOT 1.7* 1.8* 1.9*  TRIG  --   --  226*   Estimated Creatinine Clearance: 47.7 ml/min (by C-G formula based on Cr of 1.65).    Recent Labs  09/19/13 0017 09/19/13 0647  GLUCAP 115* 128*      Insulin Requirements in the past 24 hours:  None  Assessment: 67 y.o. male s/p Hartman's procedure (sigmoid colectomy with end colostomy on 9/23 for perforated sigmoid diverticulitis). Patient with estimated PO intake <75% of estimated needs for ~3 weeks PTA.  His diet was advanced to dysphagia but remains unable to meet needs with PO intake due to very poor appetite.  TPN for short-term nutritional support - patient has some degree of delirium so he is unable to make decisions for now.  Noted TPN initiated late and actually started this AM.   GI: functional GI tract - on dysphagia  diet and PO intake remains poor.  Kcal count done 9/29 showed PO intake providing ~500 kCal, 14 gm protein. Ostomy O/P increased to 1100/24h - on loperamide QID.  Baseline prealbumin 6.8 (low).  On multivitamin  Endo: No h/o DM, CBGs controlled on SSI  Lytes: mild hyponatremia (?d/t fluid 10/1), others WNL.  Watch K+ as tacrolimus could contribute to increasing K+.  Off KPhos.  Refeeding risk now minimal.  Renal: CKD stage 3 - SCr up to 1.65, decent UOP, net negative  Pulm: stable on RA  Cards: hx HTN.  S/p heart transplant [facility] 2003); continues on immunosuppressive therapy with tacro (level 8.6 on 10/2 - at goal), azathioprine, prednisone.  ECHO shows diastolic dysfunction.  BP normal, rare tachycardia - on IV Lopressor  Hepatobil: LFTS WNL, tbili 1.9.  TG elevated at 226 - watch closely  Neuro: confusion (likely delirium) so unable to participate in goals of care meeting.   ID: Vanc/Zosyn D#3 + Mycamine D#2, empiric for fever, was on ertapenem 9/21 >> 10/1, also on Nystatin QID. 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  Hgb improved to 9.8 post transfusion, plts WNL  Best Practices: SQ heparin  TPN Access: PICC placed 10/2  TPN day#: 1 (  10/2 >> )  Current Nutrition:  Dysphagia diet (minimal intake) Ensure supplements TID (received 3 yesterday) Clinimix E 5/15 at 40 ml/hr + IVFE at 10 ml/hr  Nutritional Goals:  2000-2200 kCal, 100-110 grams of protein per day   Plan:  - Increase Clinimix E 5/15 to goal rate of 83 ml/hr and continue IVFE 20% at 10 ml/hr.  TPN to provide 1894 kCal and 100gm protein per day, meeting 95% of minimal kCal and 100% of minimal protein needs. - Continue sensitive SSI Q6H, d/c if CBGs remain controlled at goal TPN rate - No multivitamin or trace elements in TPN as pt taking PO multivitamin - Mag sulfate 1gm IV x 1 today - F/U AM labs; watch TG - F/U long-term  nutritional plan as TPN is not the best option for patient with a functional GI tract.  Consider PEG placement for TF.    Dolores Ewing D. Laney Potash, PharmD, BCPS Pager:  [phone] 09/19/2013, 9:09 AM

## 2013-09-19 NOTE — Progress Notes (Signed)
Physical Therapy Treatment Patient Details Name: Peter Clarke MRN: [id number] DOB: 1946-11-22 Today's Date: 09/19/2013 Time:  -     PT Assessment / Plan / Recommendation   Visit Information  Assistance Needed: +1 Reason Eval/Treat Not Completed: Patient at procedure or test/unavailable      GP     Sallyanne Kuster 09/19/2013, 3:01 PM  Sallyanne Kuster, PTA Office- [phone]

## 2013-09-19 NOTE — Consult Note (Addendum)
WOC ostomy consult  Stoma type/location: Pt with colostomy to left lower quad. Stomal assessment/size: Pouch intact with good seal; pt's wife states bedside nurse changed last evening so will not remove at this time.  Stoma red and viable when visualized through pouch. Output 250cc liquid brown stool.  Pouch is requiring frequent emptying R/T high output and imodium has been ordered according to staff nurse. Ostomy pouching: 2pc.  Education provided: Pt very weak; do not think he will be getting OOB to empty after discharge or participate in pouch changes. Wife able to empty pouch into urinal which is tilted and less likely to spill in bed.  She is able to close with velcro.  Very nervous regarding caring for ostomy and overwhelmed; she feels she cannot care for him at home but he refuses SNF.  Recommend home health assistance or Hospice is desired.  Supplies ordered to bedside for staff use.   Cammie Mcgee MSN, RN, CWOCN, [place], CNS [phone]

## 2013-09-19 NOTE — Progress Notes (Signed)
agree

## 2013-09-19 NOTE — Progress Notes (Signed)
Patient Peter Clarke      DOB: [birth year]      AVW:[id number]   Palliative Medicine Team at [facility] Progress Note    Subjective: Patient is more agitated and delirious today. He also is to sleep easily and will not cooperate with his wife. He is attempting to drink contrast for a CT scan of the abdomen. Emotional support offered to his spouse will continue to follow as we discover a source for his infection and delirium.     Filed Vitals:   09/19/13 1522  BP: 128/76  Pulse: 100  Temp: 99.6 F (37.6 C)  Resp: 16   Physical exam:   Gen. confused, slightly agitated attempting to gather data to the bathroom can be redirected but is impulsive. Pupils equal reactive to light extraocular muscles intact mucous membranes are dry he remains with slight patches of thrush Chest is decreased with good auscultation his rhonchorous wheezes Cardiovascular is tachycardic positive S1 and S2 no S3-S4 Abdomen is firm minimally diffusely tender without guarding Extremities are frail but no edema neurologically the patient is oriented to time place or person he is slightly impulsive    Sodium 1:30 potassium 4.4 chloride 99 CO2 is 19 BUN 18 creatinine 1.65 albumin is 1.9 White count 9.3 hemoglobin of 1 hematocrit 26.9 and platelets of 398   Assessment and plan: 67 year-old white male with a known history of head and neck cancer to the hospital with a completely separate complaints namely duration of the diverticulum. The patient underwent colectomy with Hartman's pouch and colostomy placement. His course is complicated by postoperative fever and delirium suggestive of an infection. Overnight the patient developed elevated temperature and was placed on antifungal. At present history to contrast for possible CT of the abdomen he remains very confused and agitated. Emotional support was offered to his spouse and we will continue to follow to assist with goals of care. At this time the patient's wife  is not willing to override decisions for full CODE STATUS and was hoping that her spouse could contribute to his own goals of care. They're desiring full treatment course.   1. Full CODE STATUS   2. Fever and delirium likely secondary to infection I highly supportive CT scan of the abdomen.   3. Heart transplant being seen by cardiology I concur with an infectious disease consult   Total time 20 minutes  Rosaisela Jamroz L. Ladona Ridgel, MD MBA The Palliative Medicine Team at [facility] Phone: [phone] Pager: [phone]

## 2013-09-19 NOTE — Progress Notes (Signed)
ANTIBIOTIC CONSULT NOTE  Pharmacy Consult for Vancomycin, Zosyn Indication: s/p perforated bowel - empiric   Allergies  Allergen Reactions  . Pepto-Bismol [Bismuth Subsalicylate]     rash   Labs:  Recent Labs  09/17/13 0535 09/18/13 0612 09/19/13 0448  WBC 7.7 8.3 9.6  HGB 8.1* 7.6* 9.8*  PLT 333 375 388  CREATININE 1.40* 1.55* 1.65*   Estimated Creatinine Clearance: 47.7 ml/min (by C-G formula based on Cr of 1.65). No results found for this basename: VANCOTROUGH, Leodis Binet, VANCORANDOM, GENTTROUGH, GENTPEAK, GENTRANDOM, TOBRATROUGH, TOBRAPEAK, TOBRARND, AMIKACINPEAK, AMIKACINTROU, AMIKACIN,  in the last 72 hours   Microbiology: Recent Results (from the past 720 hour(s))  CULTURE, BLOOD (ROUTINE X 2)     Status: None   Collection Time    09/07/13 11:41 AM      Result Value Range Status   Specimen Description BLOOD LEFT ANTECUBITAL   Final   Special Requests BOTTLES DRAWN AEROBIC AND ANAEROBIC 8CC   Final   Culture NO GROWTH 5 DAYS   Final   Report Status 09/12/2013 FINAL   Final  CULTURE, BLOOD (ROUTINE X 2)     Status: None   Collection Time    09/07/13 11:43 AM      Result Value Range Status   Specimen Description BLOOD RIGHT ANTECUBITAL   Final   Special Requests BOTTLES DRAWN AEROBIC AND ANAEROBIC 12CC   Final   Culture NO GROWTH 5 DAYS   Final   Report Status 09/12/2013 FINAL   Final  MRSA PCR SCREENING     Status: None   Collection Time    09/07/13  7:23 PM      Result Value Range Status   MRSA by PCR NEGATIVE  NEGATIVE Final   Comment:            The GeneXpert MRSA Assay (FDA     approved for NASAL specimens     only), is one component of a     comprehensive MRSA colonization     surveillance program. It is not     intended to diagnose MRSA     infection nor to guide or     monitor treatment for     MRSA infections.  URINE CULTURE     Status: None   Collection Time    09/08/13 10:11 AM      Result Value Range Status   Specimen Description URINE,  CLEAN CATCH   Final   Special Requests NONE   Final   Culture  Setup Time     Final   Value: 09/08/2013 13:29     Performed at [facility] Count     Final   Value: NO GROWTH     Performed at [facility]   Culture     Final   Value: NO GROWTH     Performed at [facility]   Report Status 09/09/2013 FINAL   Final  CULTURE, BLOOD (ROUTINE X 2)     Status: None   Collection Time    09/17/13  6:20 PM      Result Value Range Status   Specimen Description BLOOD LEFT HAND   Final   Special Requests BOTTLES DRAWN AEROBIC ONLY 10CC   Final   Culture  Setup Time     Final   Value: 09/18/2013 00:16     Performed at [facility]   Culture     Final   Value:  BLOOD CULTURE RECEIVED NO GROWTH TO DATE CULTURE WILL BE HELD FOR 5 DAYS BEFORE ISSUING A FINAL NEGATIVE REPORT     Performed at [facility]   Report Status PENDING   Incomplete  CULTURE, BLOOD (ROUTINE X 2)     Status: None   Collection Time    09/17/13  6:30 PM      Result Value Range Status   Specimen Description BLOOD LEFT ARM   Final   Special Requests BOTTLES DRAWN AEROBIC ONLY 10CC   Final   Culture  Setup Time     Final   Value: 09/18/2013 00:16     Performed at [facility]   Culture     Final   Value:        BLOOD CULTURE RECEIVED NO GROWTH TO DATE CULTURE WILL BE HELD FOR 5 DAYS BEFORE ISSUING A FINAL NEGATIVE REPORT     Performed at [facility]   Report Status PENDING   Incomplete  CLOSTRIDIUM DIFFICILE BY PCR     Status: None   Collection Time    09/17/13  6:59 PM      Result Value Range Status   C difficile by pcr NEGATIVE  NEGATIVE Final   Assessment: 67 year old male with head and neck cancer s/p heart transplant now s/p laparoscopic sigmoid colectomy with end colostomy for perforated bowel previously on ertapenem post surgery now on vancomycin, zosyn and started on micafungin last night after continued fevers.   Blood cxs and cdiff are  negative to date. Fevers continued overnight, wbc slight trend upward to 9.6. Scr trending up slightly, will check vancomycin level over weekend.  Tacrolimus level 2.5>>8.6 (goal 5-10) after increasing dose to 2 mg bid. This level was discussed with HF service and transplant physician and plan is to continue current dosing and recheck level next week.  Goal of Therapy:  Vancomycin trough level 10-15 mcg/ml Appropriate Zosyn dosing  Plan:  1) Zosyn 3.375 grams iv Q 8 hours - 4 hr infusion 2) Vancomycin 1250 mg iv Q 24 hours-trough over weekend 3) Continue to follow Scr, cultures, progress, and fever curve 4) Continue tacrolimus 2mg  bid SL - recheck level 10/6  Sheppard Coil PharmD., BCPS Clinical Pharmacist Pager [phone] 09/19/2013 11:12 AM

## 2013-09-19 NOTE — Progress Notes (Signed)
[facility] PROGRESS NOTE  Peter Clarke UJW:[id number] DOB: [birth year] DOA: 09/07/2013 PCP: [name], MD  Assessment/Plan:  1. Diverticulitis with perforation  -care as per Gen Surgery - now s/p laparoscopic assisted Hartman's procedure (sigmoid colectomy with end colostomy) w/ lysis of adhesions.  -stable surgically. -Antibiotics broad to vancomycin and zosyn on 10-01 due to spike in fever.  -CT abdomen /pelvis.   2. History of heart transplant/chronic immunosuppression/history of Chronic Systolic heart failure/New grade 2 DD  -care as per CHF Team  -ECHO this admit showed grade 2 DD.  -Cardiology has been periodically updating transplant surgeon at [facility].  -Patient's CHF stable  -Patient on Prograf, increase to 2 mg sub BID.  -Will defer lasix to Cardiologist. Chest x ray with pleural effusion.   3-Delirium:May be related to infection, hospital delirium. CT head negative. Ammonia level 29.   4-fever: antibiotics broad to vancomycin and zosyn day 2.  Started on Micafungin 10-02. Chest x tay atelectasis. Will cover for infection.  c diff negative. Blood culture pending. CT abdomen/pelvis ordered by surgery. Will discussed with surgery getting CT with only oral contrast due to renal failure.   5. Head and neck cancer  -tx'd at [facility]  -Imuran dose decreased to 100 mg Daily in August when dx'd with throat cancer.  -continue home steroid dose   6. Hypomagnesemia  Resolved. Replete with TPN.   7. Hypophosphatemia; stable. Replete with TPN.   8. HTN (hypertension)  -BP within AHA guidelines.   9. CKD (chronic kidney disease) stage 3, GFR 30-59 ml/min  -cr slightly increase to 1.6. Monitor. Will resume IV fluids.   10. Malnutrition;  -Continue with  TPN.   11-Hyponatremia; continue with IV fluids. Improving.   Code Status: Full Code.  Family Communication: care discussed with patient.  Disposition Plan: remain in patient. Will ask LTAC for evaluation.     Consultants: Cardiology  General surgery   Procedures: 2-D echocardiogram  - Left ventricle: The cavity size was normal. Wall thickness was normal. Systolic function was normal. The estimated ejection fraction was in the range of 60% to 65%. Wall motion was normal; there were no regional wall motion abnormalities. Features are consistent with a pseudonormal left ventricular filling pattern, with concomitant abnormal relaxation and increased filling pressure (grade 2 diastolic dysfunction). - Aortic valve: There was no stenosis. - Mitral valve: Trivial regurgitation. - Left atrium: The atrium was mildly dilated. - Right ventricle: Poorly visualized. The cavity size was normal. Systolic function was normal. - Right atrium: The atrium was mildly dilated. - Pulmonary arteries: No complete TR doppler jet so unable to estimate PA systolic pressure. - Systemic veins: IVC was not visualized.  09/07/2013 perforated sigmoid diverticulitis; S/P Laparoscopic assisted Hartman's procedure (sigmoid colectomy with end colostomy), lysis of adhesions x 1 hr by Dr. Gaynelle Adu (surgeon)   Antibiotics:  Vancomycin 10-01  Zosyn 10-01  Micafungin 10-02.   HPI/Subjective: Denies dyspnea. Patient confuse this morning. Complaining of abdominal pain.   Objective: Filed Vitals:   09/19/13 0538  BP: 136/81  Pulse: 106  Temp: 101.2 F (38.4 C)  Resp: 18    Intake/Output Summary (Last 24 hours) at 09/19/13 0949 Last data filed at 09/19/13 0700  Gross per 24 hour  Intake 1294.5 ml  Output   1950 ml  Net -655.5 ml   Filed Weights   09/11/13 0400 09/11/13 1506 09/18/13 1547  Weight: 82.8 kg (182 lb 8.7 oz) 84 kg (185 lb 3 oz) 81.3 kg (179 lb 3.7  oz)    Exam:   General:  No distress.   Cardiovascular: S 1, S 2 RRR  Respiratory: CTA  Abdomen: BS present, soft, clean dressing. Ostomy in place.   Musculoskeletal: no edema.   Data Reviewed: Basic Metabolic Panel:  Recent  Labs Lab 09/15/13 0537 09/16/13 0720 09/17/13 0535 09/18/13 0612 09/19/13 0448  NA 134* 135 131* 129* 130*  K 4.5 5.4* 4.6 4.9 4.4  CL 102 105 102 101 99  CO2 20 20 19 19 19   GLUCOSE 127* 126* 131* 130* 116*  BUN 42* 31* 21 15 18   CREATININE 1.43* 1.50* 1.40* 1.55* 1.65*  CALCIUM 8.1* 8.5 7.9* 7.5* 8.2*  MG 1.7 1.4* 1.4* 1.4* 1.7  PHOS 2.9 2.8 2.8 3.4 3.7   Liver Function Tests:  Recent Labs Lab 09/15/13 0537 09/16/13 0720 09/17/13 0535 09/18/13 0612 09/19/13 0448  AST 56* 74* 68* 42* 32  ALT 40 48 54* 41 35  ALKPHOS 73 81 77 73 76  BILITOT 3.1* 2.3* 1.7* 1.8* 1.9*  PROT 5.6* 5.7* 5.5* 5.1* 5.8*  ALBUMIN 1.8* 1.9* 1.8* 1.6* 1.9*   No results found for this basename: LIPASE, AMYLASE,  in the last 168 hours  Recent Labs Lab 09/17/13 1829  AMMONIA 29   CBC:  Recent Labs Lab 09/15/13 0537 09/16/13 0720 09/17/13 0535 09/18/13 0612 09/19/13 0448  WBC 9.6 9.6 7.7 8.3 9.6  NEUTROABS 8.4* 8.3* 6.7 7.0 8.3*  HGB 9.3* 8.7* 8.1* 7.6* 9.8*  HCT 27.4* 25.2* 23.4* 22.3* 28.4*  MCV 98.6 97.3 97.5 98.2 94.0  PLT 269 315 333 375 388   Cardiac Enzymes: No results found for this basename: CKTOTAL, CKMB, CKMBINDEX, TROPONINI,  in the last 168 hours BNP (last 3 results) No results found for this basename: PROBNP,  in the last 8760 hours CBG:  Recent Labs Lab 09/19/13 0017 09/19/13 0647  GLUCAP 115* 128*    Recent Results (from the past 240 hour(s))  CULTURE, BLOOD (ROUTINE X 2)     Status: None   Collection Time    09/17/13  6:20 PM      Result Value Range Status   Specimen Description BLOOD LEFT HAND   Final   Special Requests BOTTLES DRAWN AEROBIC ONLY 10CC   Final   Culture  Setup Time     Final   Value: 09/18/2013 00:16     Performed at [facility]   Culture     Final   Value:        BLOOD CULTURE RECEIVED NO GROWTH TO DATE CULTURE WILL BE HELD FOR 5 DAYS BEFORE ISSUING A FINAL NEGATIVE REPORT     Performed at [facility]   Report  Status PENDING   Incomplete  CULTURE, BLOOD (ROUTINE X 2)     Status: None   Collection Time    09/17/13  6:30 PM      Result Value Range Status   Specimen Description BLOOD LEFT ARM   Final   Special Requests BOTTLES DRAWN AEROBIC ONLY 10CC   Final   Culture  Setup Time     Final   Value: 09/18/2013 00:16     Performed at [facility]   Culture     Final   Value:        BLOOD CULTURE RECEIVED NO GROWTH TO DATE CULTURE WILL BE HELD FOR 5 DAYS BEFORE ISSUING A FINAL NEGATIVE REPORT     Performed at [facility]   Report Status PENDING  Incomplete  CLOSTRIDIUM DIFFICILE BY PCR     Status: None   Collection Time    09/17/13  6:59 PM      Result Value Range Status   C difficile by pcr NEGATIVE  NEGATIVE Final     Studies: Dg Chest 2 View  09/17/2013   *RADIOLOGY REPORT*  Clinical Data: Fever.  Hypertension.  CHEST - 2 VIEW  Comparison: Chest radiograph 09/09/2013.  Findings: Low lung volumes.  Stable cardiac and mediastinal contours status post median sternotomy.  Redemonstrated fractured superior sternotomy wire.  Bilateral lower lung heterogeneous pulmonary opacities.  Small bilateral pleural effusions.  Regional skeleton is unremarkable.  IMPRESSION: Small bilateral pleural effusions.  Low lung volumes.  Bibasilar opacities likely represent atelectasis.  Infection not excluded.   Original Report Authenticated By: Annia Belt, M.D   Ct Head Wo Contrast  09/17/2013   CLINICAL DATA:  Confusion  EXAM: CT HEAD WITHOUT CONTRAST  TECHNIQUE: Contiguous axial images were obtained from the base of the skull through the vertex without intravenous contrast.  COMPARISON:  CT head without contrast 08/20/2013  FINDINGS: No acute cortical infarct, hemorrhage, or mass lesion is present. The ventricles are of normal size. No significant extra-axial fluid collection is present.  A fluid level is again seen within the right maxillary sinus. The remaining paranasal sinuses and the mastoid air  cells are clear. The osseous skull is intact.  IMPRESSION: 1. Normal CT appearance of the brain. 2. Right maxillary sinusitis.   Electronically Signed   By: Gennette Pac   On: 09/17/2013 20:34    Scheduled Meds: . azaTHIOprine  100 mg Oral Daily  . feeding supplement  237 mL Oral TID BM  . heparin  5,000 Units Subcutaneous Q8H  . insulin aspart  0-9 Units Subcutaneous Q6H  . loperamide  2 mg Oral QID  . magnesium sulfate 1 - 4 g bolus IVPB  1 g Intravenous Once  . metoprolol  5 mg Intravenous Q6H  . micafungin (MYCAMINE) IV  100 mg Intravenous QHS  . multivitamin with minerals  1 tablet Oral Daily  . nystatin  5 mL Oral QID  . piperacillin-tazobactam (ZOSYN)  IV  3.375 g Intravenous Q8H  . predniSONE  5 mg Oral Daily  . sodium chloride  3 mL Intravenous Q12H  . tacrolimus  2 mg Sublingual BID  . vancomycin  1,250 mg Intravenous Q24H   Continuous Infusions: . sodium chloride    . sodium chloride    . Marland KitchenTPN (CLINIMIX-E) Adult 40 mL/hr at 09/19/13 0023   And  . fat emulsion 250 mL (09/19/13 0022)  . Marland KitchenTPN (CLINIMIX-E) Adult     And  . fat emulsion      Principal Problem:   HTN (hypertension) Active Problems:   History of heart transplant/chronic immunosupression   Head and neck cancer   Diverticulitis with perforation   Chronic diastolic heart failure   Pre-operative cardiovascular examination   Fever   Leukocytosis   CKD (chronic kidney disease) stage 3, GFR 30-59 ml/min   Malnutrition of moderate degree   Hypomagnesemia   Quality of life palliative care encounter    Time spent: 25 minutes.     Peter Clarke  [facility] Pager [phone]. If 7PM-7AM, please contact night-coverage at [email or web address], password [facility] 09/19/2013, 9:49 AM  LOS: 12 days

## 2013-09-20 ENCOUNTER — Encounter (HOSPITAL_COMMUNITY): Payer: Self-pay | Admitting: Radiology

## 2013-09-20 DIAGNOSIS — N189 Chronic kidney disease, unspecified: Secondary | ICD-10-CM

## 2013-09-20 LAB — GLUCOSE, CAPILLARY
Glucose-Capillary: 130 mg/dL — ABNORMAL HIGH (ref 70–99)
Glucose-Capillary: 131 mg/dL — ABNORMAL HIGH (ref 70–99)
Glucose-Capillary: 135 mg/dL — ABNORMAL HIGH (ref 70–99)

## 2013-09-20 LAB — CBC WITH DIFFERENTIAL/PLATELET
Basophils Absolute: 0 10*3/uL (ref 0.0–0.1)
Basophils Relative: 0 % (ref 0–1)
Eosinophils Relative: 1 % (ref 0–5)
HCT: 26.9 % — ABNORMAL LOW (ref 39.0–52.0)
Lymphocytes Relative: 3 % — ABNORMAL LOW (ref 12–46)
Lymphs Abs: 0.3 10*3/uL — ABNORMAL LOW (ref 0.7–4.0)
Monocytes Absolute: 0.9 10*3/uL (ref 0.1–1.0)
Neutro Abs: 8 10*3/uL — ABNORMAL HIGH (ref 1.7–7.7)
Neutrophils Relative %: 86 % — ABNORMAL HIGH (ref 43–77)
Platelets: 398 10*3/uL (ref 150–400)
RBC: 2.82 MIL/uL — ABNORMAL LOW (ref 4.22–5.81)
RDW: 15.2 % (ref 11.5–15.5)
WBC: 9.3 10*3/uL (ref 4.0–10.5)

## 2013-09-20 LAB — BASIC METABOLIC PANEL
CO2: 18 mEq/L — ABNORMAL LOW (ref 19–32)
Calcium: 7.9 mg/dL — ABNORMAL LOW (ref 8.4–10.5)
Chloride: 100 mEq/L (ref 96–112)
Sodium: 132 mEq/L — ABNORMAL LOW (ref 135–145)

## 2013-09-20 LAB — MAGNESIUM: Magnesium: 1.4 mg/dL — ABNORMAL LOW (ref 1.5–2.5)

## 2013-09-20 LAB — PHOSPHORUS: Phosphorus: 3.2 mg/dL (ref 2.3–4.6)

## 2013-09-20 MED ORDER — FAT EMULSION 20 % IV EMUL
250.0000 mL | INTRAVENOUS | Status: AC
  Administered 2013-09-20: 250 mL via INTRAVENOUS
  Filled 2013-09-20: qty 250

## 2013-09-20 MED ORDER — SODIUM CHLORIDE 0.9 % IV SOLN
1.0000 g | Freq: Three times a day (TID) | INTRAVENOUS | Status: DC
  Administered 2013-09-20 – 2013-09-23 (×9): 1 g via INTRAVENOUS
  Filled 2013-09-20 (×10): qty 1

## 2013-09-20 MED ORDER — CLINIMIX E/DEXTROSE (5/15) 5 % IV SOLN
INTRAVENOUS | Status: AC
  Administered 2013-09-20: 17:00:00 via INTRAVENOUS
  Filled 2013-09-20: qty 2000

## 2013-09-20 MED ORDER — MAGNESIUM SULFATE 40 MG/ML IJ SOLN
4.0000 g | Freq: Once | INTRAMUSCULAR | Status: AC
  Administered 2013-09-20: 4 g via INTRAVENOUS
  Filled 2013-09-20: qty 100

## 2013-09-20 NOTE — Progress Notes (Signed)
11 Days Post-Op  Subjective: Pt confused at  Times.  Complains of back pain.    Objective: Vital signs in last 24 hours: Temp:  [99.3 F (37.4 C)-99.6 F (37.6 C)] 99.3 F (37.4 C) (10/03 2044) Pulse Rate:  [100] 100 (10/03 2044) Resp:  [16] 16 (10/03 2044) BP: (128-139)/(76-83) 139/83 mmHg (10/03 2044) SpO2:  [94 %-96 %] 94 % (10/03 2044) Last BM Date: 09/19/13 (per ostomy )  Intake/Output from previous day: 10/03 0701 - 10/04 0700 In: 1957.5 [I.V.:1007.5; IV Piggyback:600; TPN:350] Out: 2150 [Urine:670; Stool:1480] Intake/Output this shift:    Incision/Wound:wound open and clean.  Ostomy pink and viable output a little less   Lab Results:   Recent Labs  09/19/13 0448 09/20/13 0516  WBC 9.6 9.3  HGB 9.8* 9.1*  HCT 28.4* 26.9*  PLT 388 398   BMET  Recent Labs  09/19/13 0448 09/20/13 0516  NA 130* 132*  K 4.4 4.6  CL 99 100  CO2 19 18*  GLUCOSE 116* 136*  BUN 18 21  CREATININE 1.65* 1.49*  CALCIUM 8.2* 7.9*   PT/INR No results found for this basename: LABPROT, INR,  in the last 72 hours ABG No results found for this basename: PHART, PCO2, PO2, HCO3,  in the last 72 hours  Studies/Results: Ct Abdomen Pelvis Wo Contrast  09/19/2013   CLINICAL DATA:  History of diverticulitis treated with surgery 10 days ago. Patient has fever and abdominal pain.  EXAM: CT ABDOMEN AND PELVIS WITHOUT CONTRAST  TECHNIQUE: Multidetector CT imaging of the abdomen and pelvis was performed following the standard protocol without intravenous contrast.  COMPARISON:  09/07/2013  FINDINGS: Since the prior CT, abdominal surgery has been performed. There is a midline defect along the skin and subcutaneous soft tissues extending to the fascia reflecting an open incision. The fascia was closed. A colostomy has been formed in the left lower quadrant. The sigmoid colon in its midportion has been ligated with a bowel anastomosis staple line.  There is a collection in the low mid abdomen anterior  to the aortic bifurcation and iliac vessels. It contains fluid and small nondependent bubbles of air. Its wall is somewhat ill-defined. It measures 6.8 cm x 2.5 cm by 10.6 cm in size. Along its right superior margin it abuts an area of hazy and nodular soft tissue density with interspersed foci of air. This lies in the right mid abdomen and is contiguous with similar opacity and air in the central abdomen. No other formed fluid collection is seen.  The left colon is mostly decompressed. The transverse and ascending colon are normal in caliber. There are air-fluid levels in this portion of the colon. No wall thickening. There is mild dilation of proximal small bowel without a discrete transition point. Small bowel air-fluid levels are also noted. This is likely due to a mild diffuse adynamic ileus.  There are small bilateral pleural effusions. There is dependent lower lobe atelectasis.  Multiple small low-density lesions are noted in the liver. These were present on the prior exam and were seen on exam dated 02/11/2009. They are likely cysts. The liver is otherwise unremarkable. Normal spleen. The gallbladder is distended. There are dependent gallstones. No gallbladder wall thickening or adjacent inflammation is seen. There is no bile duct dilation. Normal pancreas. No adrenal masses. There is mild bilateral renal cortical thinning. There are no renal masses or stones. No hydronephrosis. Normal ureters and bladder. The prostate is enlarged. There is a fat containing left inguinal hernia which  stable.  There are degenerative changes of the visualized spine.  IMPRESSION: 1. Fluid collection with small bubbles of air in the low central abdomen measuring 10.6 cm in greatest dimension. This is consistent with an early abscess. There is abnormal extraluminal air and adjacent hazy and nodular soft tissue attenuation in the right mid abdomen that extends across the central abdomen. This has an inflammatory appearance. The  nodular areas within this may reflect prominent lymph nodes. No additional formed fluid collection is seen to suggest additional abscesses. 2. Air-fluid levels within the colon and small bowel with prominence of the proximal small bowel likely an adynamic ileus. No convincing obstruction. 3. Small bilateral pleural effusions with dependent lung base atelectasis. 4. Left lower quadrant colostomy. Hartman's pouch has been formed from the lower sigmoid colon. 5. Other chronic findings as detailed stable from the prior exams.   Electronically Signed   By: Amie Portland M.D.   On: 09/19/2013 16:04    Anti-infectives: Anti-infectives   Start     Dose/Rate Route Frequency Ordered Stop   09/18/13 2300  micafungin (MYCAMINE) 100 mg in sodium chloride 0.9 % 100 mL IVPB     100 mg 100 mL/hr over 1 Hours Intravenous Daily at bedtime 09/18/13 2235     09/17/13 1800  piperacillin-tazobactam (ZOSYN) IVPB 3.375 g     3.375 g 12.5 mL/hr over 240 Minutes Intravenous Every 8 hours 09/17/13 1725     09/17/13 1800  vancomycin (VANCOCIN) 1,250 mg in sodium chloride 0.9 % 250 mL IVPB     1,250 mg 166.7 mL/hr over 90 Minutes Intravenous Every 24 hours 09/17/13 1725     09/07/13 2000  ertapenem (INVANZ) 1 g in sodium chloride 0.9 % 50 mL IVPB  Status:  Discontinued     1 g 100 mL/hr over 30 Minutes Intravenous Every 24 hours 09/07/13 1914 09/17/13 1715   09/07/13 1430  piperacillin-tazobactam (ZOSYN) IVPB 3.375 g     3.375 g 12.5 mL/hr over 240 Minutes Intravenous  Once 09/07/13 1425 09/07/13 1516   09/07/13 1430  metroNIDAZOLE (FLAGYL) IVPB 500 mg     500 mg 100 mL/hr over 60 Minutes Intravenous  Once 09/07/13 1425 09/07/13 1617      Assessment/Plan: s/p Procedure(s): Lap. Assisted Colectomy, lysis of adhesions, Colostomy (N/A) Failure to thrive Possible abscess Discussed fluid collection with IR.  They feel it is drainable.  will set up drainage for Sunday in am.  NPO after midnight and hold heparin for  procedure in am.    LOS: 13 days    Meagan Spease A. 09/20/2013

## 2013-09-20 NOTE — Progress Notes (Addendum)
PARENTERAL NUTRITION CONSULT NOTE:  FOLLOW-UP  Pharmacy Consult:  TPN Indication:  Ileus  Allergies  Allergen Reactions  . Pepto-Bismol [Bismuth Subsalicylate]     rash    Patient Measurements: Height: 6' (182.9 cm) Weight: 179 lb 3.7 oz (81.3 kg) IBW/kg (Calculated) : 77.6 Usual Weight: 84 kg  Vital Signs: Temp: 99.3 F (37.4 C) (10/03 2044) Temp src: Oral (10/03 2044) BP: 139/83 mmHg (10/03 2044) Pulse Rate: 100 (10/03 2044) Intake/Output from previous day: 10/03 0701 - 10/04 0700 In: 1957.5 [I.V.:1007.5; IV Piggyback:600; TPN:350] Out: 2150 [Urine:670; Stool:1480]  Labs:  Recent Labs  09/18/13 0612 09/19/13 0448 09/20/13 0516  WBC 8.3 9.6 9.3  HGB 7.6* 9.8* 9.1*  HCT 22.3* 28.4* 26.9*  PLT 375 388 398     Recent Labs  09/18/13 0612 09/19/13 0448 09/20/13 0516  NA 129* 130* 132*  K 4.9 4.4 4.6  CL 101 99 100  CO2 19 19 18*  GLUCOSE 130* 116* 136*  BUN 15 18 21   CREATININE 1.55* 1.65* 1.49*  CALCIUM 7.5* 8.2* 7.9*  MG 1.4* 1.7 1.4*  PHOS 3.4 3.7 3.2  PROT 5.1* 5.8*  --   ALBUMIN 1.6* 1.9*  --   AST 42* 32  --   ALT 41 35  --   ALKPHOS 73 76  --   BILITOT 1.8* 1.9*  --   TRIG  --  226*  --    Estimated Creatinine Clearance: 52.8 ml/min (by C-G formula based on Cr of 1.49).    Recent Labs  09/19/13 1728 09/19/13 2355 09/20/13 0609  GLUCAP 136* 131* 135*      Insulin Requirements in the past 24 hours:  3 units SSI  Assessment: 67 y.o. male s/p Hartman's procedure (sigmoid colectomy with end colostomy on 9/23 for perforated sigmoid diverticulitis). Patient with estimated PO intake <75% of estimated needs for ~3 weeks PTA.  His diet was advanced to dysphagia but remains unable to meet needs with PO intake due to very poor appetite.  TPN originally started for short-term nutritional support, now for ileus on 09/19/13 CT.   GI: 10/3 CT showed adynamic ileus - on dysphagia diet and PO intake remains poor.  kCal count done 9/29 showed PO  intake providing ~500 kCal, 14 gm protein. Ostomy O/P increased to 1100/24h - on loperamide QID.  Baseline prealbumin 6.8 (low).  On multivitamin  Endo: No hx DM, CBGs controlled on SSI  Lytes: hyponatremia improving, magnesium decreased post 1gm supplementation yesterday, CO2 slightly low.  Watch K+ as tacrolimus could contribute to increasing K+.  Off KPhos.  Renal: CKD stage 3 - SCr improved to 1.49, decent UOP, net negative  Pulm: stable on RA - pleural effusion on CT  Cards: hx HTN.  S/p heart transplant [facility] 2003); continues on immunosuppressive therapy with tacro (level 8.6 on 10/2 - at goal), azathioprine, prednisone.  ECHO shows diastolic dysfunction.  VSS on IV Lopressor  Hepatobil: LFTS WNL, tbili 1.9.  TG elevated at 226 - watch closely  Neuro: confusion (likely delirium) so unable to participate in goals of care meeting.   ID: Vanc/Zosyn D#4 + Mycamine D#3, empiric for fever.  10/3 CT showed early abscess.  Was on ertapenem 9/21 >> 10/1, also on Nystatin QID. 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  Hgb trending down again, s/p transfusion 10/2, plts WNL  Best Practices: SQ heparin  TPN Access: PICC placed 10/2  TPN day#: 2 (10/2 >> )  Current Nutrition:  Dysphagia diet (minimal intake) Ensure supplements TID (received 2 yesterday) Clinimix E 5/15 at 83 ml/hr + IVFE at 10 ml/hr  Nutritional Goals:  2000-2200 kCal, 100-110 grams of protein per day   Plan:  - Continue Clinimix E 5/15 at goal rate of 83 ml/hr and IVFE 20% at 10 ml/hr.  TPN provides 1894 kCal and 100gm protein per day, meeting 95% of minimal kCal and 100% of minimal protein needs - Continue sensitive SSI Q6H, likely d/c in AM - No multivitamin or trace elements in TPN as pt taking PO multivitamin - Mag sulfate 4gm IV x 1 - Check TG Monday; magnesium level on Monday unless other labs ordered in AM     Marcheta Horsey D.  Laney Potash, PharmD, BCPS Pager:  [phone] 09/20/2013, 8:39 AM    =====================================  Addendum:  Patient to change from Zosyn to Merrem per ID   Plan: - Merrem 1gm IV Q8H - F/U vanc trough    Brienna Bass D. Laney Potash, PharmD, BCPS Pager:  [phone] 09/20/2013, 2:50 PM    =====================================  Addendum: Vanc trough = 11.8 mcg/mL (goal 10-15 mcg/mL)    Plan: - Continue vanc 1250mg  IV Q24H - Monitor renal fxn, PRN vanc trough to r/o accumulation    Dashawn Bartnick D. Laney Potash, PharmD, BCPS Pager:  [phone] 09/20/2013, 6:47 PM

## 2013-09-20 NOTE — Consult Note (Signed)
HPI: Peter Clarke is an 67 y.o. male who recently underwent Hartman's procedure for diverticulitis. He was doing fairly well but has now been admitted with abd pain and leukocytosis. CT done shows a fluid collection concerning for abscess in the low mid abdomen. IR is asked to eval for consideration of percutaneous drainage. Imaging reviewed by Dr. Grace Isaac, amenable to perc drainage. Chart, PMHx, meds reviewed.  Past Medical History:  Past Medical History  Diagnosis Date  . Hypertension   . H/O heart transplant 2003  . Wears glasses   . Wears dentures     upper  . Renal insufficiency   . Cancer     tonsil  . Throat cancer     Past Surgical History:  Past Surgical History  Procedure Laterality Date  . Heart transplant N/A 2003    [facility]-  . Appendectomy    . Colonoscopy    . Hernia repair  2008    rt ing  . Cardiac catheterization  2011  . Tonsillectomy Left 07/07/2013    Procedure: TONSILLECTOMY;  Surgeon: Darletta Moll, MD;  Location: [facility];  Service: ENT;  Laterality: Left;  . Direct laryngoscopy N/A 07/07/2013    Procedure: DIRECT LARYNGOSCOPY WITH BIOPSY;  Surgeon: Darletta Moll, MD;  Location: [facility];  Service: ENT;  Laterality: N/A;  . Colon resection N/A 09/09/2013    Procedure: Lap. Assisted Colectomy, lysis of adhesions, Colostomy;  Surgeon: Atilano Ina, MD;  Location: [facility];  Service: General;  Laterality: N/A;    Family History: History reviewed. No pertinent family history.  Social History:  reports that he has been smoking Pipe.  He has never used smokeless tobacco. He reports that he does not drink alcohol or use illicit drugs.  Allergies:  Allergies  Allergen Reactions  . Pepto-Bismol [Bismuth Subsalicylate]     rash    Medications:   Medication List    ASK your doctor about these medications       amLODipine 5 MG tablet  Commonly known as:  NORVASC  Take 5 mg by mouth daily.     azaTHIOprine 50 MG tablet   Commonly known as:  IMURAN  Take 100 mg by mouth daily.     carvedilol 25 MG tablet  Commonly known as:  COREG  Take 25 mg by mouth 2 (two) times daily.     CENTRUM SILVER PO  Take 1 tablet by mouth daily.     predniSONE 5 MG tablet  Commonly known as:  DELTASONE  Take 5 mg by mouth daily.     tacrolimus 1 MG capsule  Commonly known as:  PROGRAF  Take 4 mg by mouth daily.        Please HPI for pertinent positives, otherwise complete 10 system ROS negative.  Physical Exam: BP 139/83  Pulse 100  Temp(Src) 99.3 F (37.4 C) (Oral)  Resp 16  Ht 6' (1.829 m)  Wt 179 lb 3.7 oz (81.3 kg)  BMI 24.3 kg/m2  SpO2 94% Body mass index is 24.3 kg/(m^2).   General Appearance:  Alert, cooperative, no distress, appears stated age  Head:  Normocephalic, without obvious abnormality, atraumatic  ENT: Unremarkable  Neck: Supple, symmetrical, trachea midline  Lungs:   Clear to auscultation bilaterally, no w/r/r.  Chest Wall:  No tenderness or deformity  Heart:  Regular rate and rhythm, S1, S2 normal, no murmur, rub or gallop.  Abdomen:   Soft, mildly tender lower abd. LLQ ostomy  pink and viable.  Neurologic: Normal affect, no gross deficits.   Results for orders placed during the hospital encounter of 09/07/13 (from the past 48 hour(s))  TACROLIMUS LEVEL     Status: None   Collection Time    09/18/13 10:25 AM      Result Value Range   Tacrolimus (FK506) - [facility] 8.6     Comment:            Trough (immediately    15.0     following transplant)                Trough (steady state  3.0-8.0     2 weeks or more after     transplant)                Detection Limit = 1.0 ng/mL                Performed by LC-MS/MS technology     Performed at [facility]  TYPE AND SCREEN     Status: None   Collection Time    09/18/13  1:00 PM      Result Value Range   ABO/RH(D) O POS     Antibody Screen NEG     Sample Expiration 09/21/2013     Unit Number [id number]     Blood  Component Type RED CELLS,LR     Unit division 00     Status of Unit ISSUED,FINAL     Transfusion Status OK TO TRANSFUSE     Crossmatch Result Compatible     Unit Number [id number]     Blood Component Type RED CELLS,LR     Unit division 00     Status of Unit ISSUED,FINAL     Transfusion Status OK TO TRANSFUSE     Crossmatch Result Compatible    PREPARE RBC (CROSSMATCH)     Status: None   Collection Time    09/18/13  1:00 PM      Result Value Range   Order Confirmation ORDER PROCESSED BY BLOOD BANK    GLUCOSE, CAPILLARY     Status: Abnormal   Collection Time    09/19/13 12:17 AM      Result Value Range   Glucose-Capillary 115 (*) 70 - 99 mg/dL   Comment 1 Documented in Chart     Comment 2 Notify RN    CBC WITH DIFFERENTIAL     Status: Abnormal   Collection Time    09/19/13  4:48 AM      Result Value Range   WBC 9.6  4.0 - 10.5 K/uL   RBC 3.02 (*) 4.22 - 5.81 MIL/uL   Hemoglobin 9.8 (*) 13.0 - 17.0 g/dL   Comment: POST TRANSFUSION SPECIMEN   HCT 28.4 (*) 39.0 - 52.0 %   MCV 94.0  78.0 - 100.0 fL   MCH 32.5  26.0 - 34.0 pg   MCHC 34.5  30.0 - 36.0 g/dL   RDW 30.8 (*) 65.7 - 84.6 %   Platelets 388  150 - 400 K/uL   Neutrophils Relative % 87 (*) 43 - 77 %   Neutro Abs 8.3 (*) 1.7 - 7.7 K/uL   Lymphocytes Relative 6 (*) 12 - 46 %   Lymphs Abs 0.6 (*) 0.7 - 4.0 K/uL   Monocytes Relative 7  3 - 12 %   Monocytes Absolute 0.6  0.1 - 1.0 K/uL   Eosinophils Relative 0  0 - 5 %   Eosinophils Absolute 0.0  0.0 - 0.7 K/uL   Basophils Relative 0  0 - 1 %   Basophils Absolute 0.0  0.0 - 0.1 K/uL  COMPREHENSIVE METABOLIC PANEL     Status: Abnormal   Collection Time    09/19/13  4:48 AM      Result Value Range   Sodium 130 (*) 135 - 145 mEq/L   Potassium 4.4  3.5 - 5.1 mEq/L   Chloride 99  96 - 112 mEq/L   CO2 19  19 - 32 mEq/L   Glucose, Bld 116 (*) 70 - 99 mg/dL   BUN 18  6 - 23 mg/dL   Creatinine, Ser 1.61 (*) 0.50 - 1.35 mg/dL   Calcium 8.2 (*) 8.4 - 10.5 mg/dL   Total  Protein 5.8 (*) 6.0 - 8.3 g/dL   Albumin 1.9 (*) 3.5 - 5.2 g/dL   AST 32  0 - 37 U/L   ALT 35  0 - 53 U/L   Alkaline Phosphatase 76  39 - 117 U/L   Total Bilirubin 1.9 (*) 0.3 - 1.2 mg/dL   GFR calc non Af Amer 41 (*) >90 mL/min   GFR calc Af Amer 48 (*) >90 mL/min   Comment: (NOTE)     The eGFR has been calculated using the CKD EPI equation.     This calculation has not been validated in all clinical situations.     eGFR's persistently <90 mL/min signify possible Chronic Kidney     Disease.  MAGNESIUM     Status: None   Collection Time    09/19/13  4:48 AM      Result Value Range   Magnesium 1.7  1.5 - 2.5 mg/dL  PHOSPHORUS     Status: None   Collection Time    09/19/13  4:48 AM      Result Value Range   Phosphorus 3.7  2.3 - 4.6 mg/dL  TRIGLYCERIDES     Status: Abnormal   Collection Time    09/19/13  4:48 AM      Result Value Range   Triglycerides 226 (*) <150 mg/dL  GLUCOSE, CAPILLARY     Status: Abnormal   Collection Time    09/19/13  6:47 AM      Result Value Range   Glucose-Capillary 128 (*) 70 - 99 mg/dL   Comment 1 Documented in Chart     Comment 2 Notify RN    GLUCOSE, CAPILLARY     Status: Abnormal   Collection Time    09/19/13 12:04 PM      Result Value Range   Glucose-Capillary 154 (*) 70 - 99 mg/dL   Comment 1 Notify RN    GLUCOSE, CAPILLARY     Status: Abnormal   Collection Time    09/19/13  5:28 PM      Result Value Range   Glucose-Capillary 136 (*) 70 - 99 mg/dL  GLUCOSE, CAPILLARY     Status: Abnormal   Collection Time    09/19/13 11:55 PM      Result Value Range   Glucose-Capillary 131 (*) 70 - 99 mg/dL  CBC WITH DIFFERENTIAL     Status: Abnormal   Collection Time    09/20/13  5:16 AM      Result Value Range   WBC 9.3  4.0 - 10.5 K/uL   RBC 2.82 (*) 4.22 - 5.81 MIL/uL   Hemoglobin 9.1 (*) 13.0 - 17.0 g/dL   HCT 09.6 (*) 04.5 - 40.9 %   MCV  95.4  78.0 - 100.0 fL   MCH 32.3  26.0 - 34.0 pg   MCHC 33.8  30.0 - 36.0 g/dL   RDW 16.1  09.6 -  04.5 %   Platelets 398  150 - 400 K/uL   Neutrophils Relative % 86 (*) 43 - 77 %   Neutro Abs 8.0 (*) 1.7 - 7.7 K/uL   Lymphocytes Relative 3 (*) 12 - 46 %   Lymphs Abs 0.3 (*) 0.7 - 4.0 K/uL   Monocytes Relative 10  3 - 12 %   Monocytes Absolute 0.9  0.1 - 1.0 K/uL   Eosinophils Relative 1  0 - 5 %   Eosinophils Absolute 0.1  0.0 - 0.7 K/uL   Basophils Relative 0  0 - 1 %   Basophils Absolute 0.0  0.0 - 0.1 K/uL  BASIC METABOLIC PANEL     Status: Abnormal   Collection Time    09/20/13  5:16 AM      Result Value Range   Sodium 132 (*) 135 - 145 mEq/L   Potassium 4.6  3.5 - 5.1 mEq/L   Chloride 100  96 - 112 mEq/L   CO2 18 (*) 19 - 32 mEq/L   Glucose, Bld 136 (*) 70 - 99 mg/dL   BUN 21  6 - 23 mg/dL   Creatinine, Ser 4.09 (*) 0.50 - 1.35 mg/dL   Calcium 7.9 (*) 8.4 - 10.5 mg/dL   GFR calc non Af Amer 47 (*) >90 mL/min   GFR calc Af Amer 54 (*) >90 mL/min   Comment: (NOTE)     The eGFR has been calculated using the CKD EPI equation.     This calculation has not been validated in all clinical situations.     eGFR's persistently <90 mL/min signify possible Chronic Kidney     Disease.  MAGNESIUM     Status: Abnormal   Collection Time    09/20/13  5:16 AM      Result Value Range   Magnesium 1.4 (*) 1.5 - 2.5 mg/dL  PHOSPHORUS     Status: None   Collection Time    09/20/13  5:16 AM      Result Value Range   Phosphorus 3.2  2.3 - 4.6 mg/dL  GLUCOSE, CAPILLARY     Status: Abnormal   Collection Time    09/20/13  6:09 AM      Result Value Range   Glucose-Capillary 135 (*) 70 - 99 mg/dL   Ct Abdomen Pelvis Wo Contrast  09/19/2013   CLINICAL DATA:  History of diverticulitis treated with surgery 10 days ago. Patient has fever and abdominal pain.  EXAM: CT ABDOMEN AND PELVIS WITHOUT CONTRAST  TECHNIQUE: Multidetector CT imaging of the abdomen and pelvis was performed following the standard protocol without intravenous contrast.  COMPARISON:  09/07/2013  FINDINGS: Since the prior CT,  abdominal surgery has been performed. There is a midline defect along the skin and subcutaneous soft tissues extending to the fascia reflecting an open incision. The fascia was closed. A colostomy has been formed in the left lower quadrant. The sigmoid colon in its midportion has been ligated with a bowel anastomosis staple line.  There is a collection in the low mid abdomen anterior to the aortic bifurcation and iliac vessels. It contains fluid and small nondependent bubbles of air. Its wall is somewhat ill-defined. It measures 6.8 cm x 2.5 cm by 10.6 cm in size. Along its right superior margin it abuts an area  of hazy and nodular soft tissue density with interspersed foci of air. This lies in the right mid abdomen and is contiguous with similar opacity and air in the central abdomen. No other formed fluid collection is seen.  The left colon is mostly decompressed. The transverse and ascending colon are normal in caliber. There are air-fluid levels in this portion of the colon. No wall thickening. There is mild dilation of proximal small bowel without a discrete transition point. Small bowel air-fluid levels are also noted. This is likely due to a mild diffuse adynamic ileus.  There are small bilateral pleural effusions. There is dependent lower lobe atelectasis.  Multiple small low-density lesions are noted in the liver. These were present on the prior exam and were seen on exam dated 02/11/2009. They are likely cysts. The liver is otherwise unremarkable. Normal spleen. The gallbladder is distended. There are dependent gallstones. No gallbladder wall thickening or adjacent inflammation is seen. There is no bile duct dilation. Normal pancreas. No adrenal masses. There is mild bilateral renal cortical thinning. There are no renal masses or stones. No hydronephrosis. Normal ureters and bladder. The prostate is enlarged. There is a fat containing left inguinal hernia which stable.  There are degenerative changes of  the visualized spine.  IMPRESSION: 1. Fluid collection with small bubbles of air in the low central abdomen measuring 10.6 cm in greatest dimension. This is consistent with an early abscess. There is abnormal extraluminal air and adjacent hazy and nodular soft tissue attenuation in the right mid abdomen that extends across the central abdomen. This has an inflammatory appearance. The nodular areas within this may reflect prominent lymph nodes. No additional formed fluid collection is seen to suggest additional abscesses. 2. Air-fluid levels within the colon and small bowel with prominence of the proximal small bowel likely an adynamic ileus. No convincing obstruction. 3. Small bilateral pleural effusions with dependent lung base atelectasis. 4. Left lower quadrant colostomy. Hartman's pouch has been formed from the lower sigmoid colon. 5. Other chronic findings as detailed stable from the prior exams.   Electronically Signed   By: Amie Portland M.D.   On: 09/19/2013 16:04    Assessment/Plan Diverticulitis with recent Hartman's procedure. Post op abdominal abscess/fluid collection. Discussed CT guided drainage. Explained procedure, risks, complications, use of sedation. Labs reviewed. Consent signed in chart. Pt on TPN. NPO p MN For procedure tomorrow 10/5  Brayton El PA-C 09/20/2013, 10:21 AM

## 2013-09-20 NOTE — Progress Notes (Signed)
. Advanced Heart Failure Team Consult Note  Referring Physician: Dr. [name] ([facility]) Primary Cardiologist:  Dr. 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  S/p partial colectomy on 9/23 with lysis of adhesions.   Resting comfortably now. CT showed abdominal fluid collection concerning for abscess. Pending tap today. Less diaphoretic.   Still with low grade temps to 99. On vanc/zosyn/micafungin   Objective:    Vital Signs:   Temp:  [99.3 F (37.4 C)-99.6 F (37.6 C)] 99.3 F (37.4 C) (10/03 2044) Pulse Rate:  [100-101] 101 (10/04 1223) Resp:  [16] 16 (10/03 2044) BP: (125-139)/(68-83) 125/68 mmHg (10/04 1223) SpO2:  [94 %-96 %] 94 % (10/03 2044) Last BM Date: 09/19/13 (per ostomy )  Weight change: Filed Weights   09/11/13 0400 09/11/13 1506 09/18/13 1547  Weight: 82.8 kg (182 lb 8.7 oz) 84 kg (185 lb 3 oz) 81.3 kg (179 lb 3.7 oz)    Intake/Output:   Intake/Output Summary (Last 24 hours) at 09/20/13 1311 Last data filed at 09/20/13 1200  Gross per 24 hour  Intake 1957.5 ml  Output   1750 ml  Net  207.5 ml     Physical Exam: General:  Lying in bed. No distress.  HEENT: normal Neck: supple. JVP 6 . Carotids 2+ bilat; no bruits. No lymphadenopathy or thryomegaly appreciated. Cor: PMI nondisplaced. Regular rate & rhythm. No rubs, gallops or murmurs. Lungs: clear Abdomen: soft, nontender. + colostomy. Good bs Extremities: no cyanosis, clubbing, rash, edema Neuro: alert & confused, cranial nerves grossly intact. moves all 4 extremities w/o difficulty. Affect pleasant  Telemetry: SR 95  Labs: Basic Metabolic  Panel:  Recent Labs Lab 09/16/13 0720 09/17/13 0535 09/18/13 0612 09/19/13 0448 09/20/13 0516  NA 135 131* 129* 130* 132*  K 5.4* 4.6 4.9 4.4 4.6  CL 105 102 101 99 100  CO2 20 19 19 19  18*  GLUCOSE 126* 131* 130* 116* 136*  BUN 31* 21 15 18 21   CREATININE 1.50* 1.40* 1.55* 1.65* 1.49*  CALCIUM 8.5 7.9* 7.5* 8.2* 7.9*  MG 1.4* 1.4* 1.4* 1.7 1.4*  PHOS 2.8 2.8 3.4 3.7 3.2    Liver Function Tests:  Recent Labs Lab 09/15/13 0537 09/16/13 0720 09/17/13 0535 09/18/13 0612 09/19/13 0448  AST 56* 74* 68* 42* 32  ALT 40 48 54* 41 35  ALKPHOS 73 81 77 73 76  BILITOT 3.1* 2.3* 1.7* 1.8* 1.9*  PROT 5.6* 5.7* 5.5* 5.1* 5.8*  ALBUMIN 1.8* 1.9* 1.8* 1.6* 1.9*   No results found for this basename: LIPASE, AMYLASE,  in the last 168 hours  Recent Labs Lab 09/17/13 1829  AMMONIA 29    CBC:  Recent Labs Lab 09/16/13 0720 09/17/13 0535 09/18/13 0612 09/19/13 0448 09/20/13 0516  WBC 9.6 7.7 8.3 9.6 9.3  NEUTROABS 8.3* 6.7 7.0 8.3* 8.0*  HGB 8.7* 8.1* 7.6* 9.8* 9.1*  HCT 25.2* 23.4* 22.3* 28.4* 26.9*  MCV 97.3 97.5 98.2 94.0 95.4  PLT 315 333 375 388 398    Cardiac Enzymes: No results found for this basename: CKTOTAL, CKMB, CKMBINDEX, TROPONINI,  in the last 168 hours  BNP: BNP (last 3 results) No results found for this basename: PROBNP,  in the last 8760 hours  CBG:  Recent Labs Lab 09/19/13 1204 09/19/13 1728 09/19/13 2355 09/20/13 0609 09/20/13 1214  GLUCAP 154* 136* 131* 135* 149*    Coagulation Studies: No results found for this basename: LABPROT, INR,  in the last 72 hours Tacrolimus level: 6   Imaging: Ct Abdomen Pelvis Wo Contrast  09/19/2013   CLINICAL DATA:  History of diverticulitis treated with surgery 10 days ago. Patient has fever and abdominal pain.  EXAM: CT ABDOMEN AND PELVIS WITHOUT CONTRAST  TECHNIQUE: Multidetector CT imaging of the abdomen and pelvis was performed following the standard protocol without intravenous contrast.   COMPARISON:  09/07/2013  FINDINGS: Since the prior CT, abdominal surgery has been performed. There is a midline defect along the skin and subcutaneous soft tissues extending to the fascia reflecting an open incision. The fascia was closed. A colostomy has been formed in the left lower quadrant. The sigmoid colon in its midportion has been ligated with a bowel anastomosis staple line.  There is a collection in the low mid abdomen anterior to the aortic bifurcation and iliac vessels. It contains fluid and small nondependent bubbles of air. Its wall is somewhat ill-defined. It measures 6.8 cm x 2.5 cm by 10.6 cm in size. Along its right superior margin it abuts an area of hazy and nodular soft tissue density with interspersed foci of air. This lies in the right mid abdomen and is contiguous with similar opacity and air in the central abdomen. No other formed fluid collection is seen.  The left colon is mostly decompressed. The transverse and ascending colon are normal in caliber. There are air-fluid levels in this portion of the colon. No wall thickening. There is mild dilation of proximal small bowel without a discrete transition point. Small bowel air-fluid levels are also noted. This is likely due to a mild diffuse adynamic ileus.  There are small bilateral pleural effusions. There is dependent lower lobe atelectasis.  Multiple small low-density lesions are noted in the liver. These were present on the prior exam and were seen on exam dated 02/11/2009. They are likely cysts. The liver is otherwise unremarkable. Normal spleen. The gallbladder is distended. There are dependent gallstones. No gallbladder wall thickening or adjacent inflammation is seen. There is no bile duct dilation. Normal pancreas. No adrenal masses. There is mild bilateral renal cortical thinning. There are no renal masses or stones. No hydronephrosis. Normal ureters and bladder. The prostate is enlarged. There is a fat containing left inguinal  hernia which stable.  There are degenerative changes of the visualized spine.  IMPRESSION: 1. Fluid collection with small bubbles of air in the low central abdomen measuring 10.6 cm in greatest dimension. This is consistent with an early abscess. There is abnormal extraluminal air and adjacent hazy and nodular soft tissue attenuation in the right mid abdomen that extends across the central abdomen. This has an inflammatory appearance. The nodular areas within this may reflect prominent lymph nodes. No additional formed fluid collection is seen to suggest additional abscesses. 2. Air-fluid levels within the colon and small bowel with prominence of the proximal small bowel likely an adynamic ileus. No convincing obstruction. 3. Small bilateral pleural effusions with dependent lung base atelectasis. 4. Left lower quadrant colostomy. Hartman's pouch has been formed from the lower sigmoid colon. 5. Other chronic findings as detailed stable from the prior exams.   Electronically Signed   By: Amie Portland M.D.   On: 09/19/2013 16:04     Medications:  Current Medications: . azaTHIOprine  100 mg Oral Daily  . feeding supplement  237 mL Oral TID BM  . insulin aspart  0-9 Units Subcutaneous Q6H  . loperamide  2 mg Oral QID  . metoprolol  5 mg Intravenous Q6H  . micafungin (MYCAMINE) IV  100 mg Intravenous QHS  . multivitamin with minerals  1 tablet Oral Daily  . nystatin  5 mL Oral QID  . piperacillin-tazobactam (ZOSYN)  IV  3.375 g Intravenous Q8H  . predniSONE  5 mg Oral Daily  . sodium chloride  3 mL Intravenous Q12H  . tacrolimus  2 mg Sublingual BID  . vancomycin  1,250 mg Intravenous Q24H    Infusions: . sodium chloride    . sodium chloride 50 mL/hr (09/19/13 2210)  . Marland KitchenTPN (CLINIMIX-E) Adult 83 mL/hr at 09/19/13 1728   And  . fat emulsion 250 mL (09/19/13 1728)  . Marland KitchenTPN (CLINIMIX-E) Adult     And  . fat emulsion       Assessment:   1. Acute diverticulitis with perforation s/p  partial colectomy    --f/u CT with fluid collection suspicious for abscess 2. Chronic systolic HF s/p OHTx in 2003 (Peter Clarke) 3. Tonsillar cancer - currently undergoing Chemo/XRT 4.  chronic renal failure 5. Malnutrition, severe, albumin 1.6 6. Acute delirium 7. hyponatremia  Plan/Discussion:    Agree with drainage of ab fluid collection/abscess and sending for culture. Continue broad spectrum abx and fungal culture. ID to see today (thanks).   Continue current dose of tacrolimus. Recheck level Monday.  I have d/w Transplant team at [facility] and they are willing to transfer if surgical team would like.   Daniel Bensimhon,MD 1:11 PM Length of Stay: 13 Advanced Heart Failure Team Pager [phone] (M-F; 7a - 4p)  Please contact [facility] for night-coverage after hours (4p -7a ) and weekends on [email or web address]  Patient seen with NP, agree with the above.    Patient continues to have fevers on broad spectrum antibiotics.  Cultures negative so far.  He is immunosuppressed.  I think it would be a good idea to involve ID at this point.   Reuel Boom Bensimhon 09/20/2013 1:11 PM

## 2013-09-20 NOTE — Consult Note (Signed)
INFECTIOUS DISEASE CONSULT NOTE  Date of Admission:  09/07/2013  Date of Consult:  09/20/2013  Reason for Consult: intra-abdominal abscess Referring Physician: Milon Score  Impression/Recommendation Intra-abdominal abscess Perforated diverticulitis, colocstomy 9-23.  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 He may have negative cx from abscess as he has been on anbx for some time. I discussed several scenarios with pt re: etiology of abscess (leak, SSI, transmargination due to immune dysfunction). Will follow to see if his w/u reveals a source.    Thank you so much for this interesting consult,   [name] (pager) [phone] [email or web address]  Peter Clarke is an 67 y.o. male.  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 He was found on CT to have perforated sigmoid diverticulitis. His initial labs were notable for WBC 13k and CR 2.96 (prev 1.57). He underwent laparscopic sigmoid colectomy with end colostomy and lysis of adhesions on 9-23.  He did well post-operatively (afeb, WBC normalized) until he became confused on 9-26. His hospital course was further complicated by anorexia, dysphagia and poor nutrition. By 9-30 pt expressed his wish to be made palliative care but concerns about his mental status and potential for recovery left his code status unchanged (FULL). He was started on TPN. He was noted to have f/c (100.2) on 10-1. He was changed from Invanz to vanco/zosyn then. C diff (10-1).  By 10-2 his temp had gone to 101.2,  micafungin was added. He underwent CT abd/pelvis on 10-3 : 1. Fluid collection with small bubbles of air in the low central abdomen measuring 10.6 cm in greatest dimension. This is consistent with an early abscess. There is abnormal extraluminal air and adjacent hazy and nodular soft tissue attenuation in the right mid abdomen that extends across the central abdomen. This has an inflammatory appearance. The nodular areas within this may reflect prominent lymph nodes. No additional formed fluid collection is seen to suggest additional abscesses. 2. Air-fluid levels within the colon and small bowel with prominence of the proximal small bowel likely an adynamic ileus. No convincing obstruction. 3. Small bilateral pleural effusions with dependent lung base atelectasis. 4. Left lower quadrant colostomy. Hartman's pouch has been formed from the lower sigmoid colon. 5. Other chronic findings as detailed stable from the prior exams.  He is planned to have IR drainage on 10-5.   Past Medical History  Diagnosis Date  . Hypertension   . H/O heart transplant 2003  . Wears glasses   . Wears dentures     upper  . Renal insufficiency   . Cancer     tonsil  . Throat cancer     Past Surgical History  Procedure Laterality Date  . Heart transplant N/A 2003    [facility]-  . Appendectomy    . Colonoscopy    . Hernia repair  2008    rt ing  . Cardiac catheterization  2011  . Tonsillectomy Left 07/07/2013    Procedure: TONSILLECTOMY;  Surgeon: Darletta Moll, MD;  Location: [facility];  Service: ENT;  Laterality: Left;  .  Direct laryngoscopy N/A 07/07/2013    Procedure: DIRECT LARYNGOSCOPY WITH BIOPSY;  Surgeon: Darletta Moll, MD;  Location: [facility];  Service: ENT;  Laterality: N/A;  . Colon resection N/A 09/09/2013    Procedure: Lap. Assisted Colectomy, lysis of adhesions, Colostomy;  Surgeon: Atilano Ina, MD;  Location: [facility] OR;  Service: General;  Laterality: N/A;     Allergies    Allergen Reactions  . Pepto-Bismol [Bismuth Subsalicylate]     rash    Medications:  Scheduled: . azaTHIOprine  100 mg Oral Daily  . feeding supplement  237 mL Oral TID BM  . insulin aspart  0-9 Units Subcutaneous Q6H  . loperamide  2 mg Oral QID  . metoprolol  5 mg Intravenous Q6H  . micafungin (MYCAMINE) IV  100 mg Intravenous QHS  . multivitamin with minerals  1 tablet Oral Daily  . nystatin  5 mL Oral QID  . piperacillin-tazobactam (ZOSYN)  IV  3.375 g Intravenous Q8H  . predniSONE  5 mg Oral Daily  . sodium chloride  3 mL Intravenous Q12H  . tacrolimus  2 mg Sublingual BID  . vancomycin  1,250 mg Intravenous Q24H    Total days of antibiotics: mycafungin 10-2 >> Zosyn 10-1 >>> vanco 10-1 >>> Ertapenem 9/21 >>> 10-1 Flagyl 9/21 >> 9/21  Zosyn 9/21 >> 9/21          Social History:  reports that he has been smoking Pipe.  He has never used smokeless tobacco. He reports that he does not drink alcohol or use illicit drugs.  History reviewed. No pertinent family history.  General ROS: occas headahce, no neck stiffness, no visual change, no problems with PIC, n opain from Os, see HPI.   Blood pressure 125/68, pulse 101, temperature 99.3 F (37.4 C), temperature source Oral, resp. rate 16, height 6' (1.829 m), weight 81.3 kg (179 lb 3.7 oz), SpO2 94.00%. General appearance: alert, cooperative and no distress Eyes: negative findings: pupils equal, round, reactive to light and accomodation and no photophobia Throat: his tonsils are abn shaped. there is whitish coating to back of tongue (pt just drank milk).  Neck: no adenopathy and supple, symmetrical, trachea midline Lungs: clear to auscultation bilaterally Heart: regular rate and rhythm Abdomen: normal findings: bowel sounds normal and soft, non-tender and Os os clean, pink. his wound is clean, no surrounding erythema.  Extremities: edema none and RUE PIC is clean, nontender. there is no calf or biceps cordis.   Neurologic: Mental status: date is october 2015, place is ?. 3/3 immediate recall.  Sensory: normal light touch LE Motor: grossly normal   Results for orders placed during the hospital encounter of 09/07/13 (from the past 48 hour(s))  GLUCOSE, CAPILLARY     Status: Abnormal   Collection Time    09/19/13 12:17 AM      Result Value Range   Glucose-Capillary 115 (*) 70 - 99 mg/dL   Comment 1 Documented in Chart     Comment 2 Notify RN    CBC WITH DIFFERENTIAL     Status: Abnormal   Collection Time    09/19/13  4:48 AM      Result Value Range   WBC 9.6  4.0 - 10.5 K/uL   RBC 3.02 (*) 4.22 - 5.81 MIL/uL   Hemoglobin 9.8 (*) 13.0 - 17.0 g/dL   Comment: POST TRANSFUSION SPECIMEN   HCT 28.4 (*) 39.0 - 52.0 %   MCV 94.0  78.0 - 100.0 fL  MCH 32.5  26.0 - 34.0 pg   MCHC 34.5  30.0 - 36.0 g/dL   RDW 16.1 (*) 09.6 - 04.5 %   Platelets 388  150 - 400 K/uL   Neutrophils Relative % 87 (*) 43 - 77 %   Neutro Abs 8.3 (*) 1.7 - 7.7 K/uL   Lymphocytes Relative 6 (*) 12 - 46 %   Lymphs Abs 0.6 (*) 0.7 - 4.0 K/uL   Monocytes Relative 7  3 - 12 %   Monocytes Absolute 0.6  0.1 - 1.0 K/uL   Eosinophils Relative 0  0 - 5 %   Eosinophils Absolute 0.0  0.0 - 0.7 K/uL   Basophils Relative 0  0 - 1 %   Basophils Absolute 0.0  0.0 - 0.1 K/uL  COMPREHENSIVE METABOLIC PANEL     Status: Abnormal   Collection Time    09/19/13  4:48 AM      Result Value Range   Sodium 130 (*) 135 - 145 mEq/L   Potassium 4.4  3.5 - 5.1 mEq/L   Chloride 99  96 - 112 mEq/L   CO2 19  19 - 32 mEq/L   Glucose, Bld 116 (*) 70 - 99 mg/dL   BUN 18  6 - 23 mg/dL   Creatinine, Ser 4.09 (*) 0.50 - 1.35 mg/dL   Calcium 8.2 (*) 8.4 - 10.5 mg/dL   Total Protein 5.8 (*) 6.0 - 8.3 g/dL   Albumin 1.9 (*) 3.5 - 5.2 g/dL   AST 32  0 - 37 U/L   ALT 35  0 - 53 U/L   Alkaline Phosphatase 76  39 - 117 U/L   Total Bilirubin 1.9 (*) 0.3 - 1.2 mg/dL   GFR calc non Af Amer 41 (*) >90 mL/min   GFR calc Af Amer 48 (*) >90 mL/min    Comment: (NOTE)     The eGFR has been calculated using the CKD EPI equation.     This calculation has not been validated in all clinical situations.     eGFR's persistently <90 mL/min signify possible Chronic Kidney     Disease.  MAGNESIUM     Status: None   Collection Time    09/19/13  4:48 AM      Result Value Range   Magnesium 1.7  1.5 - 2.5 mg/dL  PHOSPHORUS     Status: None   Collection Time    09/19/13  4:48 AM      Result Value Range   Phosphorus 3.7  2.3 - 4.6 mg/dL  TRIGLYCERIDES     Status: Abnormal   Collection Time    09/19/13  4:48 AM      Result Value Range   Triglycerides 226 (*) <150 mg/dL  GLUCOSE, CAPILLARY     Status: Abnormal   Collection Time    09/19/13  6:47 AM      Result Value Range   Glucose-Capillary 128 (*) 70 - 99 mg/dL   Comment 1 Documented in Chart     Comment 2 Notify RN    GLUCOSE, CAPILLARY     Status: Abnormal   Collection Time    09/19/13 12:04 PM      Result Value Range   Glucose-Capillary 154 (*) 70 - 99 mg/dL   Comment 1 Notify RN    GLUCOSE, CAPILLARY     Status: Abnormal   Collection Time    09/19/13  5:28 PM      Result Value Range  Glucose-Capillary 136 (*) 70 - 99 mg/dL  GLUCOSE, CAPILLARY     Status: Abnormal   Collection Time    09/19/13 11:55 PM      Result Value Range   Glucose-Capillary 131 (*) 70 - 99 mg/dL  CBC WITH DIFFERENTIAL     Status: Abnormal   Collection Time    09/20/13  5:16 AM      Result Value Range   WBC 9.3  4.0 - 10.5 K/uL   RBC 2.82 (*) 4.22 - 5.81 MIL/uL   Hemoglobin 9.1 (*) 13.0 - 17.0 g/dL   HCT 01.0 (*) 27.2 - 53.6 %   MCV 95.4  78.0 - 100.0 fL   MCH 32.3  26.0 - 34.0 pg   MCHC 33.8  30.0 - 36.0 g/dL   RDW 64.4  03.4 - 74.2 %   Platelets 398  150 - 400 K/uL   Neutrophils Relative % 86 (*) 43 - 77 %   Neutro Abs 8.0 (*) 1.7 - 7.7 K/uL   Lymphocytes Relative 3 (*) 12 - 46 %   Lymphs Abs 0.3 (*) 0.7 - 4.0 K/uL   Monocytes Relative 10  3 - 12 %   Monocytes Absolute 0.9  0.1 - 1.0 K/uL    Eosinophils Relative 1  0 - 5 %   Eosinophils Absolute 0.1  0.0 - 0.7 K/uL   Basophils Relative 0  0 - 1 %   Basophils Absolute 0.0  0.0 - 0.1 K/uL  BASIC METABOLIC PANEL     Status: Abnormal   Collection Time    09/20/13  5:16 AM      Result Value Range   Sodium 132 (*) 135 - 145 mEq/L   Potassium 4.6  3.5 - 5.1 mEq/L   Chloride 100  96 - 112 mEq/L   CO2 18 (*) 19 - 32 mEq/L   Glucose, Bld 136 (*) 70 - 99 mg/dL   BUN 21  6 - 23 mg/dL   Creatinine, Ser 5.95 (*) 0.50 - 1.35 mg/dL   Calcium 7.9 (*) 8.4 - 10.5 mg/dL   GFR calc non Af Amer 47 (*) >90 mL/min   GFR calc Af Amer 54 (*) >90 mL/min   Comment: (NOTE)     The eGFR has been calculated using the CKD EPI equation.     This calculation has not been validated in all clinical situations.     eGFR's persistently <90 mL/min signify possible Chronic Kidney     Disease.  MAGNESIUM     Status: Abnormal   Collection Time    09/20/13  5:16 AM      Result Value Range   Magnesium 1.4 (*) 1.5 - 2.5 mg/dL  PHOSPHORUS     Status: None   Collection Time    09/20/13  5:16 AM      Result Value Range   Phosphorus 3.2  2.3 - 4.6 mg/dL  GLUCOSE, CAPILLARY     Status: Abnormal   Collection Time    09/20/13  6:09 AM      Result Value Range   Glucose-Capillary 135 (*) 70 - 99 mg/dL  GLUCOSE, CAPILLARY     Status: Abnormal   Collection Time    09/20/13 12:14 PM      Result Value Range   Glucose-Capillary 149 (*) 70 - 99 mg/dL   Comment 1 Notify RN        Component Value Date/Time   SDES BLOOD LEFT ARM 09/17/2013 1830   SPECREQUEST  BOTTLES DRAWN AEROBIC ONLY 10CC 09/17/2013 1830   CULT  Value:        BLOOD CULTURE RECEIVED NO GROWTH TO DATE CULTURE WILL BE HELD FOR 5 DAYS BEFORE ISSUING A FINAL NEGATIVE REPORT Performed at [facility] 09/17/2013 1830   REPTSTATUS PENDING 09/17/2013 1830   Ct Abdomen Pelvis Wo Contrast  09/19/2013   CLINICAL DATA:  History of diverticulitis treated with surgery 10 days ago. Patient has fever and  abdominal pain.  EXAM: CT ABDOMEN AND PELVIS WITHOUT CONTRAST  TECHNIQUE: Multidetector CT imaging of the abdomen and pelvis was performed following the standard protocol without intravenous contrast.  COMPARISON:  09/07/2013  FINDINGS: Since the prior CT, abdominal surgery has been performed. There is a midline defect along the skin and subcutaneous soft tissues extending to the fascia reflecting an open incision. The fascia was closed. A colostomy has been formed in the left lower quadrant. The sigmoid colon in its midportion has been ligated with a bowel anastomosis staple line.  There is a collection in the low mid abdomen anterior to the aortic bifurcation and iliac vessels. It contains fluid and small nondependent bubbles of air. Its wall is somewhat ill-defined. It measures 6.8 cm x 2.5 cm by 10.6 cm in size. Along its right superior margin it abuts an area of hazy and nodular soft tissue density with interspersed foci of air. This lies in the right mid abdomen and is contiguous with similar opacity and air in the central abdomen. No other formed fluid collection is seen.  The left colon is mostly decompressed. The transverse and ascending colon are normal in caliber. There are air-fluid levels in this portion of the colon. No wall thickening. There is mild dilation of proximal small bowel without a discrete transition point. Small bowel air-fluid levels are also noted. This is likely due to a mild diffuse adynamic ileus.  There are small bilateral pleural effusions. There is dependent lower lobe atelectasis.  Multiple small low-density lesions are noted in the liver. These were present on the prior exam and were seen on exam dated 02/11/2009. They are likely cysts. The liver is otherwise unremarkable. Normal spleen. The gallbladder is distended. There are dependent gallstones. No gallbladder wall thickening or adjacent inflammation is seen. There is no bile duct dilation. Normal pancreas. No adrenal masses.  There is mild bilateral renal cortical thinning. There are no renal masses or stones. No hydronephrosis. Normal ureters and bladder. The prostate is enlarged. There is a fat containing left inguinal hernia which stable.  There are degenerative changes of the visualized spine.  IMPRESSION: 1. Fluid collection with small bubbles of air in the low central abdomen measuring 10.6 cm in greatest dimension. This is consistent with an early abscess. There is abnormal extraluminal air and adjacent hazy and nodular soft tissue attenuation in the right mid abdomen that extends across the central abdomen. This has an inflammatory appearance. The nodular areas within this may reflect prominent lymph nodes. No additional formed fluid collection is seen to suggest additional abscesses. 2. Air-fluid levels within the colon and small bowel with prominence of the proximal small bowel likely an adynamic ileus. No convincing obstruction. 3. Small bilateral pleural effusions with dependent lung base atelectasis. 4. Left lower quadrant colostomy. Hartman's pouch has been formed from the lower sigmoid colon. 5. Other chronic findings as detailed stable from the prior exams.   Electronically Signed   By: Amie Portland M.D.   On: 09/19/2013 16:04   Recent Results (  from the past 240 hour(s))  CULTURE, BLOOD (ROUTINE X 2)     Status: None   Collection Time    09/17/13  6:20 PM      Result Value Range Status   Specimen Description BLOOD LEFT HAND   Final   Special Requests BOTTLES DRAWN AEROBIC ONLY 10CC   Final   Culture  Setup Time     Final   Value: 09/18/2013 00:16     Performed at [facility]   Culture     Final   Value:        BLOOD CULTURE RECEIVED NO GROWTH TO DATE CULTURE WILL BE HELD FOR 5 DAYS BEFORE ISSUING A FINAL NEGATIVE REPORT     Performed at [facility]   Report Status PENDING   Incomplete  CULTURE, BLOOD (ROUTINE X 2)     Status: None   Collection Time    09/17/13  6:30 PM      Result  Value Range Status   Specimen Description BLOOD LEFT ARM   Final   Special Requests BOTTLES DRAWN AEROBIC ONLY 10CC   Final   Culture  Setup Time     Final   Value: 09/18/2013 00:16     Performed at [facility]   Culture     Final   Value:        BLOOD CULTURE RECEIVED NO GROWTH TO DATE CULTURE WILL BE HELD FOR 5 DAYS BEFORE ISSUING A FINAL NEGATIVE REPORT     Performed at [facility]   Report Status PENDING   Incomplete  CLOSTRIDIUM DIFFICILE BY PCR     Status: None   Collection Time    09/17/13  6:59 PM      Result Value Range Status   C difficile by pcr NEGATIVE  NEGATIVE Final      09/20/2013, 1:40 PM     LOS: 13 days

## 2013-09-20 NOTE — Progress Notes (Signed)
[facility] PROGRESS NOTE  Peter Clarke ZOX:[id number] DOB: 11/18/1946 DOA: 09/07/2013 PCP: [name], MD  Assessment/Plan:  1. Diverticulitis with perforation  -care as per Gen Surgery - now s/p laparoscopic assisted Hartman's procedure (sigmoid colectomy with end colostomy) w/ lysis of adhesions.  -Antibiotics broad to vancomycin and zosyn on 10-01 due to spike in fever.  -CT abdomen /pelvis 10-03;  Show : Fluid collection with small bubbles of air in the low central abdomen measuring 10.6 cm in greatest dimension   2. History of heart transplant/chronic immunosuppression/history of Chronic Systolic heart failure/New grade 2 DD  -care as per CHF Team  -ECHO this admit showed grade 2 DD.  -Cardiology has been periodically updating transplant surgeon at [facility].  -Patient's CHF stable  -Patient on Prograf, increase to 2 mg sub BID.  -Will defer lasix to Cardiologist. Chest x ray with pleural effusion.   3-Delirium:May be related to infection, hospital delirium. CT head negative. Ammonia level 29.   4-fever: antibiotics broad to vancomycin and zosyn day 2.  Started on Micafungin 10-02. Chest x tay atelectasis.  c diff negative. Blood culture no growth to date. CT abdomen/pelvis with fluid collection concern for developing abscess.  -I have consulted ID to help with antibiotics.   5. Head and neck cancer  -Imuran dose decreased to 100 mg Daily in August when dx'd with throat cancer.  -continue home steroid dose.  6. Hypomagnesemia  Resolved. Replete with TPN.   7. Hypophosphatemia; stable. Replete with TPN.   8. HTN (hypertension)  -BP within AHA guidelines.   9. CKD (chronic kidney disease) stage 3, GFR 30-59 ml/min  -cr decrease to 1.4. Monitor. Continue with  IV fluids.   10. Malnutrition;  -Continue with  TPN.   11-Hyponatremia; continue with IV fluids. Improving.   Code Status: Full Code.  Family Communication: care discussed with wife who was at bedside. .   Disposition Plan: remain in patient.    Consultants: Cardiology  General surgery   Procedures: 2-D echocardiogram  - Left ventricle: The cavity size was normal. Wall thickness was normal. Systolic function was normal. The estimated ejection fraction was in the range of 60% to 65%. Wall motion was normal; there were no regional wall motion abnormalities. Features are consistent with a pseudonormal left ventricular filling pattern, with concomitant abnormal relaxation and increased filling pressure (grade 2 diastolic dysfunction). - Aortic valve: There was no stenosis. - Mitral valve: Trivial regurgitation. - Left atrium: The atrium was mildly dilated. - Right ventricle: Poorly visualized. The cavity size was normal. Systolic function was normal. - Right atrium: The atrium was mildly dilated. - Pulmonary arteries: No complete TR doppler jet so unable to estimate PA systolic pressure. - Systemic veins: IVC was not visualized.  09/07/2013 perforated sigmoid diverticulitis; S/P Laparoscopic assisted Hartman's procedure (sigmoid colectomy with end colostomy), lysis of adhesions x 1 hr by Dr. Gaynelle Adu (surgeon)   Antibiotics:  Vancomycin 10-01  Zosyn 10-01  Micafungin 10-02.   HPI/Subjective: No worsening abdominal pain, still very confuse.   Objective: Filed Vitals:   09/20/13 1223  BP: 125/68  Pulse: 101  Temp:   Resp:     Intake/Output Summary (Last 24 hours) at 09/20/13 1255 Last data filed at 09/20/13 1200  Gross per 24 hour  Intake 1957.5 ml  Output   1750 ml  Net  207.5 ml   Filed Weights   09/11/13 0400 09/11/13 1506 09/18/13 1547  Weight: 82.8 kg (182 lb 8.7 oz) 84 kg (185  lb 3 oz) 81.3 kg (179 lb 3.7 oz)    Exam:   General:  No distress.   Cardiovascular: S 1, S 2 RRR  Respiratory: CTA  Abdomen: BS present, soft, clean dressing. Ostomy in place.   Musculoskeletal: no edema.   Data Reviewed: Basic Metabolic Panel:  Recent Labs Lab  09/16/13 0720 09/17/13 0535 09/18/13 0612 09/19/13 0448 09/20/13 0516  NA 135 131* 129* 130* 132*  K 5.4* 4.6 4.9 4.4 4.6  CL 105 102 101 99 100  CO2 20 19 19 19  18*  GLUCOSE 126* 131* 130* 116* 136*  BUN 31* 21 15 18 21   CREATININE 1.50* 1.40* 1.55* 1.65* 1.49*  CALCIUM 8.5 7.9* 7.5* 8.2* 7.9*  MG 1.4* 1.4* 1.4* 1.7 1.4*  PHOS 2.8 2.8 3.4 3.7 3.2   Liver Function Tests:  Recent Labs Lab 09/15/13 0537 09/16/13 0720 09/17/13 0535 09/18/13 0612 09/19/13 0448  AST 56* 74* 68* 42* 32  ALT 40 48 54* 41 35  ALKPHOS 73 81 77 73 76  BILITOT 3.1* 2.3* 1.7* 1.8* 1.9*  PROT 5.6* 5.7* 5.5* 5.1* 5.8*  ALBUMIN 1.8* 1.9* 1.8* 1.6* 1.9*   No results found for this basename: LIPASE, AMYLASE,  in the last 168 hours  Recent Labs Lab 09/17/13 1829  AMMONIA 29   CBC:  Recent Labs Lab 09/16/13 0720 09/17/13 0535 09/18/13 0612 09/19/13 0448 09/20/13 0516  WBC 9.6 7.7 8.3 9.6 9.3  NEUTROABS 8.3* 6.7 7.0 8.3* 8.0*  HGB 8.7* 8.1* 7.6* 9.8* 9.1*  HCT 25.2* 23.4* 22.3* 28.4* 26.9*  MCV 97.3 97.5 98.2 94.0 95.4  PLT 315 333 375 388 398   Cardiac Enzymes: No results found for this basename: CKTOTAL, CKMB, CKMBINDEX, TROPONINI,  in the last 168 hours BNP (last 3 results) No results found for this basename: PROBNP,  in the last 8760 hours CBG:  Recent Labs Lab 09/19/13 1204 09/19/13 1728 09/19/13 2355 09/20/13 0609 09/20/13 1214  GLUCAP 154* 136* 131* 135* 149*    Recent Results (from the past 240 hour(s))  CULTURE, BLOOD (ROUTINE X 2)     Status: None   Collection Time    09/17/13  6:20 PM      Result Value Range Status   Specimen Description BLOOD LEFT HAND   Final   Special Requests BOTTLES DRAWN AEROBIC ONLY 10CC   Final   Culture  Setup Time     Final   Value: 09/18/2013 00:16     Performed at [facility]   Culture     Final   Value:        BLOOD CULTURE RECEIVED NO GROWTH TO DATE CULTURE WILL BE HELD FOR 5 DAYS BEFORE ISSUING A FINAL NEGATIVE REPORT      Performed at [facility]   Report Status PENDING   Incomplete  CULTURE, BLOOD (ROUTINE X 2)     Status: None   Collection Time    09/17/13  6:30 PM      Result Value Range Status   Specimen Description BLOOD LEFT ARM   Final   Special Requests BOTTLES DRAWN AEROBIC ONLY 10CC   Final   Culture  Setup Time     Final   Value: 09/18/2013 00:16     Performed at [facility]   Culture     Final   Value:        BLOOD CULTURE RECEIVED NO GROWTH TO DATE CULTURE WILL BE HELD FOR 5 DAYS BEFORE ISSUING A  FINAL NEGATIVE REPORT     Performed at [facility]   Report Status PENDING   Incomplete  CLOSTRIDIUM DIFFICILE BY PCR     Status: None   Collection Time    09/17/13  6:59 PM      Result Value Range Status   C difficile by pcr NEGATIVE  NEGATIVE Final     Studies: Ct Abdomen Pelvis Wo Contrast  09/19/2013   CLINICAL DATA:  History of diverticulitis treated with surgery 10 days ago. Patient has fever and abdominal pain.  EXAM: CT ABDOMEN AND PELVIS WITHOUT CONTRAST  TECHNIQUE: Multidetector CT imaging of the abdomen and pelvis was performed following the standard protocol without intravenous contrast.  COMPARISON:  09/07/2013  FINDINGS: Since the prior CT, abdominal surgery has been performed. There is a midline defect along the skin and subcutaneous soft tissues extending to the fascia reflecting an open incision. The fascia was closed. A colostomy has been formed in the left lower quadrant. The sigmoid colon in its midportion has been ligated with a bowel anastomosis staple line.  There is a collection in the low mid abdomen anterior to the aortic bifurcation and iliac vessels. It contains fluid and small nondependent bubbles of air. Its wall is somewhat ill-defined. It measures 6.8 cm x 2.5 cm by 10.6 cm in size. Along its right superior margin it abuts an area of hazy and nodular soft tissue density with interspersed foci of air. This lies in the right mid abdomen and is  contiguous with similar opacity and air in the central abdomen. No other formed fluid collection is seen.  The left colon is mostly decompressed. The transverse and ascending colon are normal in caliber. There are air-fluid levels in this portion of the colon. No wall thickening. There is mild dilation of proximal small bowel without a discrete transition point. Small bowel air-fluid levels are also noted. This is likely due to a mild diffuse adynamic ileus.  There are small bilateral pleural effusions. There is dependent lower lobe atelectasis.  Multiple small low-density lesions are noted in the liver. These were present on the prior exam and were seen on exam dated 02/11/2009. They are likely cysts. The liver is otherwise unremarkable. Normal spleen. The gallbladder is distended. There are dependent gallstones. No gallbladder wall thickening or adjacent inflammation is seen. There is no bile duct dilation. Normal pancreas. No adrenal masses. There is mild bilateral renal cortical thinning. There are no renal masses or stones. No hydronephrosis. Normal ureters and bladder. The prostate is enlarged. There is a fat containing left inguinal hernia which stable.  There are degenerative changes of the visualized spine.  IMPRESSION: 1. Fluid collection with small bubbles of air in the low central abdomen measuring 10.6 cm in greatest dimension. This is consistent with an early abscess. There is abnormal extraluminal air and adjacent hazy and nodular soft tissue attenuation in the right mid abdomen that extends across the central abdomen. This has an inflammatory appearance. The nodular areas within this may reflect prominent lymph nodes. No additional formed fluid collection is seen to suggest additional abscesses. 2. Air-fluid levels within the colon and small bowel with prominence of the proximal small bowel likely an adynamic ileus. No convincing obstruction. 3. Small bilateral pleural effusions with dependent lung  base atelectasis. 4. Left lower quadrant colostomy. Hartman's pouch has been formed from the lower sigmoid colon. 5. Other chronic findings as detailed stable from the prior exams.   Electronically Signed   By: Onalee Hua  Ormond M.D.   On: 09/19/2013 16:04    Scheduled Meds: . azaTHIOprine  100 mg Oral Daily  . feeding supplement  237 mL Oral TID BM  . insulin aspart  0-9 Units Subcutaneous Q6H  . loperamide  2 mg Oral QID  . metoprolol  5 mg Intravenous Q6H  . micafungin (MYCAMINE) IV  100 mg Intravenous QHS  . multivitamin with minerals  1 tablet Oral Daily  . nystatin  5 mL Oral QID  . piperacillin-tazobactam (ZOSYN)  IV  3.375 g Intravenous Q8H  . predniSONE  5 mg Oral Daily  . sodium chloride  3 mL Intravenous Q12H  . tacrolimus  2 mg Sublingual BID  . vancomycin  1,250 mg Intravenous Q24H   Continuous Infusions: . sodium chloride    . sodium chloride 50 mL/hr (09/19/13 2210)  . Marland KitchenTPN (CLINIMIX-E) Adult 83 mL/hr at 09/19/13 1728   And  . fat emulsion 250 mL (09/19/13 1728)  . Marland KitchenTPN (CLINIMIX-E) Adult     And  . fat emulsion      Principal Problem:   HTN (hypertension) Active Problems:   History of heart transplant/chronic immunosupression   Head and neck cancer   Diverticulitis with perforation   Chronic diastolic heart failure   Pre-operative cardiovascular examination   Fever   Leukocytosis   CKD (chronic kidney disease) stage 3, GFR 30-59 ml/min   Malnutrition of moderate degree   Hypomagnesemia   Quality of life palliative care encounter    Time spent: 25 minutes.     Peter Clarke  [facility] Pager [phone]. If 7PM-7AM, please contact night-coverage at [email or web address], password [facility] 09/20/2013, 12:55 PM  LOS: 13 days

## 2013-09-21 ENCOUNTER — Inpatient Hospital Stay (HOSPITAL_COMMUNITY): Payer: Medicare Other

## 2013-09-21 DIAGNOSIS — K651 Peritoneal abscess: Secondary | ICD-10-CM

## 2013-09-21 DIAGNOSIS — R404 Transient alteration of awareness: Secondary | ICD-10-CM

## 2013-09-21 LAB — BASIC METABOLIC PANEL
Chloride: 99 mEq/L (ref 96–112)
GFR calc non Af Amer: 53 mL/min — ABNORMAL LOW (ref >90)
Glucose, Bld: 122 mg/dL — ABNORMAL HIGH (ref 70–99)
Potassium: 4.9 mEq/L (ref 3.5–5.1)

## 2013-09-21 LAB — URINALYSIS, ROUTINE W REFLEX MICROSCOPIC
Glucose, UA: NEGATIVE mg/dL
Nitrite: NEGATIVE
Protein, ur: NEGATIVE mg/dL
Urobilinogen, UA: 0.2 mg/dL (ref 0.0–1.0)
pH: 5 (ref 5.0–8.0)

## 2013-09-21 LAB — CBC WITH DIFFERENTIAL/PLATELET
Basophils Absolute: 0 10*3/uL (ref 0.0–0.1)
Basophils Relative: 0 % (ref 0–1)
Eosinophils Relative: 1 % (ref 0–5)
HCT: 26.9 % — ABNORMAL LOW (ref 39.0–52.0)
Lymphocytes Relative: 9 % — ABNORMAL LOW (ref 12–46)
MCHC: 34.2 g/dL (ref 30.0–36.0)
MCV: 95.1 fL (ref 78.0–100.0)
Monocytes Absolute: 0.6 10*3/uL (ref 0.1–1.0)
Monocytes Relative: 6 % (ref 3–12)
RBC: 2.83 MIL/uL — ABNORMAL LOW (ref 4.22–5.81)
RDW: 14.5 % (ref 11.5–15.5)
WBC: 9.4 10*3/uL (ref 4.0–10.5)

## 2013-09-21 LAB — URINE MICROSCOPIC-ADD ON

## 2013-09-21 LAB — GLUCOSE, CAPILLARY: Glucose-Capillary: 122 mg/dL — ABNORMAL HIGH (ref 70–99)

## 2013-09-21 MED ORDER — FENTANYL CITRATE 0.05 MG/ML IJ SOLN
INTRAMUSCULAR | Status: AC | PRN
  Administered 2013-09-21 (×2): 25 ug via INTRAVENOUS

## 2013-09-21 MED ORDER — ACETAMINOPHEN 325 MG PO TABS: 650.0000 mg | ORAL_TABLET | ORAL | Status: DC | PRN

## 2013-09-21 MED ORDER — MIDAZOLAM HCL 2 MG/2ML IJ SOLN
INTRAMUSCULAR | Status: AC
  Filled 2013-09-21: qty 2

## 2013-09-21 MED ORDER — MAGNESIUM SULFATE 40 MG/ML IJ SOLN
2.0000 g | Freq: Once | INTRAMUSCULAR | Status: AC
  Administered 2013-09-21: 2 g via INTRAVENOUS
  Filled 2013-09-21: qty 50

## 2013-09-21 MED ORDER — CLINIMIX E/DEXTROSE (5/15) 5 % IV SOLN
INTRAVENOUS | Status: AC
  Administered 2013-09-21: 18:00:00 via INTRAVENOUS
  Filled 2013-09-21: qty 2000

## 2013-09-21 MED ORDER — FENTANYL CITRATE 0.05 MG/ML IJ SOLN
INTRAMUSCULAR | Status: AC
  Filled 2013-09-21: qty 2

## 2013-09-21 MED ORDER — MIDAZOLAM HCL 2 MG/2ML IJ SOLN
INTRAMUSCULAR | Status: AC | PRN
  Administered 2013-09-21: 2 mg via INTRAVENOUS

## 2013-09-21 MED ORDER — ACETAMINOPHEN 650 MG RE SUPP
650.0000 mg | RECTAL | Status: DC | PRN
  Administered 2013-09-21: 650 mg via RECTAL
  Filled 2013-09-21: qty 1

## 2013-09-21 MED ORDER — FAT EMULSION 20 % IV EMUL
250.0000 mL | INTRAVENOUS | Status: AC
  Administered 2013-09-21: 250 mL via INTRAVENOUS
  Filled 2013-09-21: qty 250

## 2013-09-21 NOTE — Progress Notes (Addendum)
PARENTERAL NUTRITION CONSULT NOTE:  FOLLOW-UP  Pharmacy Consult:  TPN Indication:  Ileus  Allergies  Allergen Reactions  . Pepto-Bismol [Bismuth Subsalicylate]     rash    Patient Measurements: Height: 6' (182.9 cm) Weight: 179 lb 3.7 oz (81.3 kg) IBW/kg (Calculated) : 77.6 Usual Weight: 84 kg  Vital Signs: Temp: 98.6 F (37 C) (10/05 0623) Temp src: Oral (10/05 0623) BP: 139/72 mmHg (10/05 0623) Pulse Rate: 103 (10/05 0623) Intake/Output from previous day: 10/04 0701 - 10/05 0700 In: -  Out: 1975 [Urine:1675; Stool:300]  Labs:  Recent Labs  09/19/13 0448 09/20/13 0516 09/21/13 0500  WBC 9.6 9.3 9.4  HGB 9.8* 9.1* 9.2*  HCT 28.4* 26.9* 26.9*  PLT 388 398 426*     Recent Labs  09/19/13 0448 09/20/13 0516  NA 130* 132*  K 4.4 4.6  CL 99 100  CO2 19 18*  GLUCOSE 116* 136*  BUN 18 21  CREATININE 1.65* 1.49*  CALCIUM 8.2* 7.9*  MG 1.7 1.4*  PHOS 3.7 3.2  PROT 5.8*  --   ALBUMIN 1.9*  --   AST 32  --   ALT 35  --   ALKPHOS 76  --   BILITOT 1.9*  --   TRIG 226*  --    Estimated Creatinine Clearance: 52.8 ml/min (by C-G formula based on Cr of 1.49).    Recent Labs  09/20/13 1824 09/20/13 2348 09/21/13 0623  GLUCAP 148* 130* 122*      Insulin Requirements in the past 24 hours:  2 units SSI  Assessment: 67 y.o. male s/p Hartman's procedure (sigmoid colectomy with end colostomy on 9/23 for perforated sigmoid diverticulitis). Patient with estimated PO intake <75% of estimated needs for ~3 weeks PTA.  His diet was advanced to dysphagia but remains unable to meet needs with PO intake due to very poor appetite.  TPN originally started for short-term nutritional support, now for ileus on 09/19/13 CT.   GI: 10/3 CT showed adynamic ileus and intra-abd fluid collection.  Was on dysphagia diet and PO intake remains poor, now NPO for perc drain placement (removed of purulent material).  kCal count done 9/29 showed PO intake providing ~500 kCal, 14 gm  protein. Ostomy O/P increased to 332mL/24h - on loperamide QID.  Baseline prealbumin 6.8 (low).  On multivitamin  Endo: No hx DM, CBGs controlled on SSI.  Minimal use of SSI  Lytes: hyponatremia, magnesium improved post 4gm.  Watch K+ as tacrolimus could contribute to increasing K+.  Off KPhos.  Renal: CKD stage 3 - SCr improved to 1.49, good UOP  Pulm: stable on RA - pleural effusion on CT  Cards: hx HTN.  S/p heart transplant [facility]); continues on immunosuppressive therapy with tacro (level 8.6 on 10/2 - at goal), azathioprine, prednisone.  ECHO shows diastolic dysfunction.  BP mostly normal, tachycardia - on IV Lopressor  Hepatobil: LFTS WNL, tbili 1.9.  TG elevated at 226 - watch closely  Neuro: confusion (likely delirium) so unable to participate in goals of care meeting.   ID: Vanc D#5 + Merrem D#2 + Mycamine D#4, empiric for fever.  10/3 CT showed early abscess.  Ertapenem 9/21 >> 10/1, also on Nystatin QID.  Now afebrile, WBC WNL, C.diff negative.  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  Hgb appears to be stabilizing, s/p transfusion 10/2, plts high at 426  Best Practices: heparin held  TPN Access: PICC  placed 10/2  TPN day#: 3 (10/2 >> )  Current Nutrition:  Dysphagia diet (minimal intake) Ensure supplements TID (received 3 yesterday) Clinimix E 5/15 at 83 ml/hr + IVFE at 10 ml/hr  Nutritional Goals:  2000-2200 kCal, 100-110 grams of protein per day   Plan:  - Continue Clinimix E 5/15 at goal rate of 83 ml/hr and IVFE 20% at 10 ml/hr.  TPN provides 1894 kCal and 100gm protein per day, meeting 95% of minimal kCal and 100% of minimal protein needs - D/C SSI / CBG checks - No multivitamin or trace elements in TPN as pt taking PO multivitamin - Mag sulfate 2gm IV x 1 - F/U AM labs    Cortasia Screws D. Laney Potash, PharmD, BCPS Pager:  [phone] 09/21/2013, 1:11 PM

## 2013-09-21 NOTE — Progress Notes (Signed)
12 Days Post-Op  Subjective: Pt still with some confusion but pleasant.  Asks same question over and over.   Objective: Vital signs in last 24 hours: Temp:  [97.8 F (36.6 C)-99 F (37.2 C)] 98.6 F (37 C) (10/05 0623) Pulse Rate:  [100-110] 103 (10/05 0623) Resp:  [18-20] 18 (10/05 0623) BP: (125-162)/(68-92) 139/72 mmHg (10/05 0623) SpO2:  [96 %-98 %] 96 % (10/05 0623) Last BM Date: 09/20/13  Intake/Output from previous day: 10/04 0701 - 10/05 0700 In: -  Out: 4098 [JXBJY:7829; Stool:300] Intake/Output this shift:    Incision/Wound:ostomy pink viable.  Wound open and clean. Ostomy functioning. Output less.  Lab Results:   Recent Labs  09/20/13 0516 09/21/13 0500  WBC 9.3 9.4  HGB 9.1* 9.2*  HCT 26.9* 26.9*  PLT 398 426*   BMET  Recent Labs  09/19/13 0448 09/20/13 0516  NA 130* 132*  K 4.4 4.6  CL 99 100  CO2 19 18*  GLUCOSE 116* 136*  BUN 18 21  CREATININE 1.65* 1.49*  CALCIUM 8.2* 7.9*   PT/INR No results found for this basename: LABPROT, INR,  in the last 72 hours ABG No results found for this basename: PHART, PCO2, PO2, HCO3,  in the last 72 hours  Studies/Results: Ct Abdomen Pelvis Wo Contrast  09/19/2013   CLINICAL DATA:  History of diverticulitis treated with surgery 10 days ago. Patient has fever and abdominal pain.  EXAM: CT ABDOMEN AND PELVIS WITHOUT CONTRAST  TECHNIQUE: Multidetector CT imaging of the abdomen and pelvis was performed following the standard protocol without intravenous contrast.  COMPARISON:  09/07/2013  FINDINGS: Since the prior CT, abdominal surgery has been performed. There is a midline defect along the skin and subcutaneous soft tissues extending to the fascia reflecting an open incision. The fascia was closed. A colostomy has been formed in the left lower quadrant. The sigmoid colon in its midportion has been ligated with a bowel anastomosis staple line.  There is a collection in the low mid abdomen anterior to the aortic  bifurcation and iliac vessels. It contains fluid and small nondependent bubbles of air. Its wall is somewhat ill-defined. It measures 6.8 cm x 2.5 cm by 10.6 cm in size. Along its right superior margin it abuts an area of hazy and nodular soft tissue density with interspersed foci of air. This lies in the right mid abdomen and is contiguous with similar opacity and air in the central abdomen. No other formed fluid collection is seen.  The left colon is mostly decompressed. The transverse and ascending colon are normal in caliber. There are air-fluid levels in this portion of the colon. No wall thickening. There is mild dilation of proximal small bowel without a discrete transition point. Small bowel air-fluid levels are also noted. This is likely due to a mild diffuse adynamic ileus.  There are small bilateral pleural effusions. There is dependent lower lobe atelectasis.  Multiple small low-density lesions are noted in the liver. These were present on the prior exam and were seen on exam dated 02/11/2009. They are likely cysts. The liver is otherwise unremarkable. Normal spleen. The gallbladder is distended. There are dependent gallstones. No gallbladder wall thickening or adjacent inflammation is seen. There is no bile duct dilation. Normal pancreas. No adrenal masses. There is mild bilateral renal cortical thinning. There are no renal masses or stones. No hydronephrosis. Normal ureters and bladder. The prostate is enlarged. There is a fat containing left inguinal hernia which stable.  There are degenerative  changes of the visualized spine.  IMPRESSION: 1. Fluid collection with small bubbles of air in the low central abdomen measuring 10.6 cm in greatest dimension. This is consistent with an early abscess. There is abnormal extraluminal air and adjacent hazy and nodular soft tissue attenuation in the right mid abdomen that extends across the central abdomen. This has an inflammatory appearance. The nodular areas  within this may reflect prominent lymph nodes. No additional formed fluid collection is seen to suggest additional abscesses. 2. Air-fluid levels within the colon and small bowel with prominence of the proximal small bowel likely an adynamic ileus. No convincing obstruction. 3. Small bilateral pleural effusions with dependent lung base atelectasis. 4. Left lower quadrant colostomy. Hartman's pouch has been formed from the lower sigmoid colon. 5. Other chronic findings as detailed stable from the prior exams.   Electronically Signed   By: Amie Portland M.D.   On: 09/19/2013 16:04    Anti-infectives: Anti-infectives   Start     Dose/Rate Route Frequency Ordered Stop   09/20/13 1600  meropenem (MERREM) 1 g in sodium chloride 0.9 % 100 mL IVPB     1 g 200 mL/hr over 30 Minutes Intravenous Every 8 hours 09/20/13 1451     09/18/13 2300  micafungin (MYCAMINE) 100 mg in sodium chloride 0.9 % 100 mL IVPB     100 mg 100 mL/hr over 1 Hours Intravenous Daily at bedtime 09/18/13 2235     09/17/13 1800  piperacillin-tazobactam (ZOSYN) IVPB 3.375 g  Status:  Discontinued     3.375 g 12.5 mL/hr over 240 Minutes Intravenous Every 8 hours 09/17/13 1725 09/20/13 1434   09/17/13 1800  vancomycin (VANCOCIN) 1,250 mg in sodium chloride 0.9 % 250 mL IVPB     1,250 mg 166.7 mL/hr over 90 Minutes Intravenous Every 24 hours 09/17/13 1725     09/07/13 2000  ertapenem (INVANZ) 1 g in sodium chloride 0.9 % 50 mL IVPB  Status:  Discontinued     1 g 100 mL/hr over 30 Minutes Intravenous Every 24 hours 09/07/13 1914 09/17/13 1715   09/07/13 1430  piperacillin-tazobactam (ZOSYN) IVPB 3.375 g     3.375 g 12.5 mL/hr over 240 Minutes Intravenous  Once 09/07/13 1425 09/07/13 1516   09/07/13 1430  metroNIDAZOLE (FLAGYL) IVPB 500 mg     500 mg 100 mL/hr over 60 Minutes Intravenous  Once 09/07/13 1425 09/07/13 1617      Assessment/Plan: s/p Procedure(s): Lap. Assisted Colectomy, lysis of adhesions, Colostomy  (N/A) Intraabdominal fluid collection  For perc drain today OK to transfer to [facility] for further care given transplant history.   LOS: 14 days    Peter Gafford A. 09/21/2013

## 2013-09-21 NOTE — Progress Notes (Signed)
  Notes reviewed.   S/p drainage of intra-abdominal abscess today. Remains on broad spectrum abx and antifungals. Fevers resolving. Hemodynamically stable.   Will check tacrolimus level in am.  If patient and family interested, I will contact transplant team at [facility] tomorrow regarding arrangements for transfer.  Johonna Binette,MD 12:09 PM

## 2013-09-21 NOTE — Progress Notes (Signed)
INFECTIOUS DISEASE PROGRESS NOTE  ID: Peter Clarke is a 67 y.o. male with  Principal Problem:   HTN (hypertension) Active Problems:   History of heart transplant/chronic immunosupression   Head and neck cancer   Diverticulitis with perforation   Chronic diastolic heart failure   Pre-operative cardiovascular examination   Fever   Leukocytosis   CKD (chronic kidney disease) stage 3, GFR 30-59 ml/min   Malnutrition of moderate degree   Hypomagnesemia   Quality of life palliative care encounter  Subjective: Uncomfortable, unable to urinate (residual > 450 cc by u/s)  Abtx:  Anti-infectives   Start     Dose/Rate Route Frequency Ordered Stop   09/20/13 1600  meropenem (MERREM) 1 g in sodium chloride 0.9 % 100 mL IVPB     1 g 200 mL/hr over 30 Minutes Intravenous Every 8 hours 09/20/13 1451     09/18/13 2300  micafungin (MYCAMINE) 100 mg in sodium chloride 0.9 % 100 mL IVPB     100 mg 100 mL/hr over 1 Hours Intravenous Daily at bedtime 09/18/13 2235     09/17/13 1800  piperacillin-tazobactam (ZOSYN) IVPB 3.375 g  Status:  Discontinued     3.375 g 12.5 mL/hr over 240 Minutes Intravenous Every 8 hours 09/17/13 1725 09/20/13 1434   09/17/13 1800  vancomycin (VANCOCIN) 1,250 mg in sodium chloride 0.9 % 250 mL IVPB     1,250 mg 166.7 mL/hr over 90 Minutes Intravenous Every 24 hours 09/17/13 1725     09/07/13 2000  ertapenem (INVANZ) 1 g in sodium chloride 0.9 % 50 mL IVPB  Status:  Discontinued     1 g 100 mL/hr over 30 Minutes Intravenous Every 24 hours 09/07/13 1914 09/17/13 1715   09/07/13 1430  piperacillin-tazobactam (ZOSYN) IVPB 3.375 g     3.375 g 12.5 mL/hr over 240 Minutes Intravenous  Once 09/07/13 1425 09/07/13 1516   09/07/13 1430  metroNIDAZOLE (FLAGYL) IVPB 500 mg     500 mg 100 mL/hr over 60 Minutes Intravenous  Once 09/07/13 1425 09/07/13 1617      Medications:  Scheduled: . azaTHIOprine  100 mg Oral Daily  . feeding supplement  237 mL Oral TID BM  .  fentaNYL      . loperamide  2 mg Oral QID  . meropenem (MERREM) IV  1 g Intravenous Q8H  . metoprolol  5 mg Intravenous Q6H  . micafungin (MYCAMINE) IV  100 mg Intravenous QHS  . midazolam      . multivitamin with minerals  1 tablet Oral Daily  . nystatin  5 mL Oral QID  . predniSONE  5 mg Oral Daily  . sodium chloride  3 mL Intravenous Q12H  . tacrolimus  2 mg Sublingual BID  . vancomycin  1,250 mg Intravenous Q24H    Objective: Vital signs in last 24 hours: Temp:  [97.3 F (36.3 C)-98.6 F (37 C)] 97.3 F (36.3 C) (10/05 1233) Pulse Rate:  [100-125] 116 (10/05 1233) Resp:  [11-22] 20 (10/05 1233) BP: (111-162)/(67-92) 137/84 mmHg (10/05 1233) SpO2:  [95 %-100 %] 100 % (10/05 1233)   General appearance: alert, cooperative and moderate distress Resp: clear to auscultation bilaterally Cardio: regular rate and rhythm GI: normal findings: bowel sounds normal, soft, non-tender and midline wound dressed, drain in place. ostomy clean.  Extremities: edema none and RUE PIC  Lab Results  Recent Labs  09/20/13 0516 09/21/13 0500 09/21/13 1210  WBC 9.3 9.4  --   HGB 9.1* 9.2*  --  HCT 26.9* 26.9*  --   NA 132*  --  130*  K 4.6  --  4.9  CL 100  --  99  CO2 18*  --  20  BUN 21  --  27*  CREATININE 1.49*  --  1.34   Liver Panel  Recent Labs  09/19/13 0448  PROT 5.8*  ALBUMIN 1.9*  AST 32  ALT 35  ALKPHOS 76  BILITOT 1.9*   Sedimentation Rate No results found for this basename: ESRSEDRATE,  in the last 72 hours C-Reactive Protein No results found for this basename: CRP,  in the last 72 hours  Microbiology: Recent Results (from the past 240 hour(s))  CULTURE, BLOOD (ROUTINE X 2)     Status: None   Collection Time    09/17/13  6:20 PM      Result Value Range Status   Specimen Description BLOOD LEFT HAND   Final   Special Requests BOTTLES DRAWN AEROBIC ONLY 10CC   Final   Culture  Setup Time     Final   Value: 09/18/2013 00:16     Performed at [facility]   Culture     Final   Value:        BLOOD CULTURE RECEIVED NO GROWTH TO DATE CULTURE WILL BE HELD FOR 5 DAYS BEFORE ISSUING A FINAL NEGATIVE REPORT     Performed at [facility]   Report Status PENDING   Incomplete  CULTURE, BLOOD (ROUTINE X 2)     Status: None   Collection Time    09/17/13  6:30 PM      Result Value Range Status   Specimen Description BLOOD LEFT ARM   Final   Special Requests BOTTLES DRAWN AEROBIC ONLY 10CC   Final   Culture  Setup Time     Final   Value: 09/18/2013 00:16     Performed at [facility]   Culture     Final   Value:        BLOOD CULTURE RECEIVED NO GROWTH TO DATE CULTURE WILL BE HELD FOR 5 DAYS BEFORE ISSUING A FINAL NEGATIVE REPORT     Performed at [facility]   Report Status PENDING   Incomplete  CLOSTRIDIUM DIFFICILE BY PCR     Status: None   Collection Time    09/17/13  6:59 PM      Result Value Range Status   C difficile by pcr NEGATIVE  NEGATIVE Final    Studies/Results: Ct Guided Abscess Drain  09/21/2013   *RADIOLOGY REPORT*  Indication: Post colectomy with Hartmann's pouch formation and temporary loop ileostomy, now with an intra abdominal abscess.  CT GUIDED ABDOMINAL DRAINAGE CATHETER PLACEMENT  Comparison: CT abdomen pelvis - 09/19/2013; 09/07/2013  Medications: Fentanyl 50 mcg IV; Versed 2 mg IV  Total Moderate Sedation time: 20 minutes  Contrast: None  Complications: None immediate  Technique / Findings:  Informed written consent was obtained from the patient after a discussion of the risks, benefits and alternatives to treatment. The patient was placed supine on the CT gantry and a pre procedural CT was performed re-demonstrating the known abscess/fluid collection within the midline of the abdomen measuring at least 5.6 x 3.1 cm (image one, series 2). The procedure was planned.   A timeout was performed prior to the initiation of the procedure.  The skin between the left lower quadrant ileostomy and midline  open abdominal wound was prepped and draped in the usual sterile  fashion.   The overlying soft tissues were anesthetized with 1% lidocaine with epinephrine.  Appropriate trajectory was planned with the use of a 22 gauge spinal needle.  An 18 gauge trocar needle was advanced into the abscess/fluid collection and a short Amplatz super stiff wire was coiled within the collection. Appropriate positioning was confirmed with a limited CT scan.  The tract was serially dilated allowing placement of a 10 [place] all- purpose drainage catheter.  Appropriate positioning was confirmed with a limited postprocedural CT scan.  Approximately 130 ml of purulent fluid was aspirated.  The tube was connected to a drainage bag and sutured in place.  A dressing was placed.  The patient tolerated the procedure well without immediate post procedural complication.  Impression:  Successful CT guided placement of a 10 French all purpose drain catheter into the midline intra abdominal abscess with aspiration of 130 mL of purulent fluid.  Samples were sent to the laboratory as requested by the ordering clinical team.   Original Report Authenticated By: Tacey Ruiz, MD     Assessment/Plan: Intra-abdominal abscess  Perforated diverticulitis, colocstomy 9-23.  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

## 2013-09-21 NOTE — Progress Notes (Signed)
Pt. Restless, saying he needed to pee but can not. Voided 300 cc an hour ago. Bladder scan done which showed  488 cc.  MD notified. See orders.

## 2013-09-21 NOTE — Procedures (Signed)
Technically successful CT guided drainage catheter placement into mid abdomen yielding 130 cc of purulent material.  Samples sent to laboratory.  No immediate post procedural complications.

## 2013-09-21 NOTE — Progress Notes (Signed)
Patient ZO:XWRUE Peter Clarke      DOB: June 27, 1946      AVW:[id number]   Palliative Medicine Team at [facility] Progress Note    Subjective: Patient remains very confused.  Pain in back controlled at this time with current medication.  Plan for IR drainage.  Filed Vitals:   09/21/13 0623  BP: 139/72  Pulse: 103  Temp: 98.6 F (37 C)  Resp: 18   Physical exam:  Deferred  Assessment and plan: 67 yr old white male with history of heart transplant, and tonsilar cancer, admitted with unrelated diverticular perforation requiring repair with colostomy.  Course is now complicated by fever with back pain, imaging shows abscess formation.  1.  Full Code 2. Emotional Support offered.   Total time 15 min  Elisse Pennick L. Ladona Ridgel, MD MBA The Palliative Medicine Team at [facility] Phone: [phone] Pager: [phone]

## 2013-09-21 NOTE — Progress Notes (Signed)
[facility] PROGRESS NOTE  Peter Clarke EAV:[id number] DOB: [birth year] DOA: 09/07/2013 PCP: [name], MD  Assessment/Plan:  1. Diverticulitis with perforation  -care as per Gen Surgery - now s/p laparoscopic assisted Hartman's procedure (sigmoid colectomy with end colostomy) w/ lysis of adhesions.  -Antibiotics broad to vancomycin on 10-01 due to spike in fever. Micafungin started 10-02. Meropenem  Started 10-04.  -CT abdomen /pelvis 10-03;  Show : Fluid collection with small bubbles of air in the low central abdomen measuring 10.6 cm in greatest dimension. -IR to do CT guided drainage of abscess 10-05.   2. History of heart transplant/chronic immunosuppression/history of Chronic Systolic heart failure/New grade 2 DD  -care as per CHF Team  -ECHO this admit showed grade 2 DD.  -Cardiology has been periodically updating transplant surgeon at [facility].  -Patient's CHF stable  -Patient on Prograf, 2 mg sub BID.  -Will defer lasix to Cardiologist. Chest x ray with pleural effusion.  -Appreciate Dr [name] help. Will discussed with Dr [name] regarding transferring patient to [facility].   3-Delirium:May be related to infection, hospital delirium. CT head negative. Ammonia level 29.   4-fever: antibiotics broad to vancomycin and zosyn day 2.  Started on Micafungin 10-02. Chest x tay atelectasis.  c diff negative. Blood culture no growth to date. CT abdomen/pelvis with fluid collection concern for developing abscess.  -Appreciate Dr hatcher recommendation. Zosyn change to meropenem.   5. Head and neck cancer  -Imuran dose decreased to 100 mg Daily in August when dx'd with throat cancer.  -continue home steroid dose.  6. Hypomagnesemia  Resolved. Replete with TPN.   7. Hypophosphatemia; stable. Replete with TPN.   8. HTN (hypertension)  -BP within AHA guidelines.   9. CKD (chronic kidney disease) stage 3, GFR 30-59 ml/min  -cr decrease to 1.4. Monitor. Continue with  IV fluids.   Renal function for this morning pending.   10. Malnutrition;  -Continue with  TPN.   11-Hyponatremia; continue with IV fluids. Improving.   Code Status: Full Code.  Family Communication: care discussed with wife who was at bedside. .  Disposition Plan: remain in patient. Will discussed with Dr [name] regarding transfer patient to [facility].    Consultants: Cardiology  General surgery   Procedures: 2-D echocardiogram  - Left ventricle: The cavity size was normal. Wall thickness was normal. Systolic function was normal. The estimated ejection fraction was in the range of 60% to 65%. Wall motion was normal; there were no regional wall motion abnormalities. Features are consistent with a pseudonormal left ventricular filling pattern, with concomitant abnormal relaxation and increased filling pressure (grade 2 diastolic dysfunction). - Aortic valve: There was no stenosis. - Mitral valve: Trivial regurgitation. - Left atrium: The atrium was mildly dilated. - Right ventricle: Poorly visualized. The cavity size was normal. Systolic function was normal. - Right atrium: The atrium was mildly dilated. - Pulmonary arteries: No complete TR doppler jet so unable to estimate PA systolic pressure. - Systemic veins: IVC was not visualized.  09/07/2013 perforated sigmoid diverticulitis; S/P Laparoscopic assisted Hartman's procedure (sigmoid colectomy with end colostomy), lysis of adhesions x 1 hr by Dr. Gaynelle Adu (surgeon)   Antibiotics:  Vancomycin 10-01  Zosyn 10-01  Micafungin 10-02.   HPI/Subjective: Still confuse. Denies worsening pain.   Objective: Filed Vitals:   09/21/13 1000  BP: 111/67  Pulse: 102  Temp:   Resp: 15    Intake/Output Summary (Last 24 hours) at 09/21/13 1038 Last data filed at 09/21/13 1002  Gross per 24 hour  Intake      0 ml  Output   1805 ml  Net  -1805 ml   Filed Weights   09/11/13 0400 09/11/13 1506 09/18/13 1547  Weight: 82.8 kg (182 lb  8.7 oz) 84 kg (185 lb 3 oz) 81.3 kg (179 lb 3.7 oz)    Exam:   General:  No distress.   Cardiovascular: S 1, S 2 RRR  Respiratory: CTA  Abdomen: BS present, soft, clean dressing. Ostomy in place.   Musculoskeletal: no edema.   Data Reviewed: Basic Metabolic Panel:  Recent Labs Lab 09/16/13 0720 09/17/13 0535 09/18/13 0612 09/19/13 0448 09/20/13 0516  NA 135 131* 129* 130* 132*  K 5.4* 4.6 4.9 4.4 4.6  CL 105 102 101 99 100  CO2 20 19 19 19  18*  GLUCOSE 126* 131* 130* 116* 136*  BUN 31* 21 15 18 21   CREATININE 1.50* 1.40* 1.55* 1.65* 1.49*  CALCIUM 8.5 7.9* 7.5* 8.2* 7.9*  MG 1.4* 1.4* 1.4* 1.7 1.4*  PHOS 2.8 2.8 3.4 3.7 3.2   Liver Function Tests:  Recent Labs Lab 09/15/13 0537 09/16/13 0720 09/17/13 0535 09/18/13 0612 09/19/13 0448  AST 56* 74* 68* 42* 32  ALT 40 48 54* 41 35  ALKPHOS 73 81 77 73 76  BILITOT 3.1* 2.3* 1.7* 1.8* 1.9*  PROT 5.6* 5.7* 5.5* 5.1* 5.8*  ALBUMIN 1.8* 1.9* 1.8* 1.6* 1.9*   No results found for this basename: LIPASE, AMYLASE,  in the last 168 hours  Recent Labs Lab 09/17/13 1829  AMMONIA 29   CBC:  Recent Labs Lab 09/17/13 0535 09/18/13 0612 09/19/13 0448 09/20/13 0516 09/21/13 0500  WBC 7.7 8.3 9.6 9.3 9.4  NEUTROABS 6.7 7.0 8.3* 8.0* 7.9*  HGB 8.1* 7.6* 9.8* 9.1* 9.2*  HCT 23.4* 22.3* 28.4* 26.9* 26.9*  MCV 97.5 98.2 94.0 95.4 95.1  PLT 333 375 388 398 426*   Cardiac Enzymes: No results found for this basename: CKTOTAL, CKMB, CKMBINDEX, TROPONINI,  in the last 168 hours BNP (last 3 results) No results found for this basename: PROBNP,  in the last 8760 hours CBG:  Recent Labs Lab 09/20/13 0609 09/20/13 1214 09/20/13 1824 09/20/13 2348 09/21/13 0623  GLUCAP 135* 149* 148* 130* 122*    Recent Results (from the past 240 hour(s))  CULTURE, BLOOD (ROUTINE X 2)     Status: None   Collection Time    09/17/13  6:20 PM      Result Value Range Status   Specimen Description BLOOD LEFT HAND   Final    Special Requests BOTTLES DRAWN AEROBIC ONLY 10CC   Final   Culture  Setup Time     Final   Value: 09/18/2013 00:16     Performed at [facility]   Culture     Final   Value:        BLOOD CULTURE RECEIVED NO GROWTH TO DATE CULTURE WILL BE HELD FOR 5 DAYS BEFORE ISSUING A FINAL NEGATIVE REPORT     Performed at [facility]   Report Status PENDING   Incomplete  CULTURE, BLOOD (ROUTINE X 2)     Status: None   Collection Time    09/17/13  6:30 PM      Result Value Range Status   Specimen Description BLOOD LEFT ARM   Final   Special Requests BOTTLES DRAWN AEROBIC ONLY 10CC   Final   Culture  Setup Time     Final  Value: 09/18/2013 00:16     Performed at [facility]   Culture     Final   Value:        BLOOD CULTURE RECEIVED NO GROWTH TO DATE CULTURE WILL BE HELD FOR 5 DAYS BEFORE ISSUING A FINAL NEGATIVE REPORT     Performed at [facility]   Report Status PENDING   Incomplete  CLOSTRIDIUM DIFFICILE BY PCR     Status: None   Collection Time    09/17/13  6:59 PM      Result Value Range Status   C difficile by pcr NEGATIVE  NEGATIVE Final     Studies: Ct Abdomen Pelvis Wo Contrast  09/19/2013   CLINICAL DATA:  History of diverticulitis treated with surgery 10 days ago. Patient has fever and abdominal pain.  EXAM: CT ABDOMEN AND PELVIS WITHOUT CONTRAST  TECHNIQUE: Multidetector CT imaging of the abdomen and pelvis was performed following the standard protocol without intravenous contrast.  COMPARISON:  09/07/2013  FINDINGS: Since the prior CT, abdominal surgery has been performed. There is a midline defect along the skin and subcutaneous soft tissues extending to the fascia reflecting an open incision. The fascia was closed. A colostomy has been formed in the left lower quadrant. The sigmoid colon in its midportion has been ligated with a bowel anastomosis staple line.  There is a collection in the low mid abdomen anterior to the aortic bifurcation and iliac  vessels. It contains fluid and small nondependent bubbles of air. Its wall is somewhat ill-defined. It measures 6.8 cm x 2.5 cm by 10.6 cm in size. Along its right superior margin it abuts an area of hazy and nodular soft tissue density with interspersed foci of air. This lies in the right mid abdomen and is contiguous with similar opacity and air in the central abdomen. No other formed fluid collection is seen.  The left colon is mostly decompressed. The transverse and ascending colon are normal in caliber. There are air-fluid levels in this portion of the colon. No wall thickening. There is mild dilation of proximal small bowel without a discrete transition point. Small bowel air-fluid levels are also noted. This is likely due to a mild diffuse adynamic ileus.  There are small bilateral pleural effusions. There is dependent lower lobe atelectasis.  Multiple small low-density lesions are noted in the liver. These were present on the prior exam and were seen on exam dated 02/11/2009. They are likely cysts. The liver is otherwise unremarkable. Normal spleen. The gallbladder is distended. There are dependent gallstones. No gallbladder wall thickening or adjacent inflammation is seen. There is no bile duct dilation. Normal pancreas. No adrenal masses. There is mild bilateral renal cortical thinning. There are no renal masses or stones. No hydronephrosis. Normal ureters and bladder. The prostate is enlarged. There is a fat containing left inguinal hernia which stable.  There are degenerative changes of the visualized spine.  IMPRESSION: 1. Fluid collection with small bubbles of air in the low central abdomen measuring 10.6 cm in greatest dimension. This is consistent with an early abscess. There is abnormal extraluminal air and adjacent hazy and nodular soft tissue attenuation in the right mid abdomen that extends across the central abdomen. This has an inflammatory appearance. The nodular areas within this may reflect  prominent lymph nodes. No additional formed fluid collection is seen to suggest additional abscesses. 2. Air-fluid levels within the colon and small bowel with prominence of the proximal small bowel likely an adynamic ileus.  No convincing obstruction. 3. Small bilateral pleural effusions with dependent lung base atelectasis. 4. Left lower quadrant colostomy. Hartman's pouch has been formed from the lower sigmoid colon. 5. Other chronic findings as detailed stable from the prior exams.   Electronically Signed   By: Amie Portland M.D.   On: 09/19/2013 16:04    Scheduled Meds: . azaTHIOprine  100 mg Oral Daily  . feeding supplement  237 mL Oral TID BM  . fentaNYL      . loperamide  2 mg Oral QID  . meropenem (MERREM) IV  1 g Intravenous Q8H  . metoprolol  5 mg Intravenous Q6H  . micafungin (MYCAMINE) IV  100 mg Intravenous QHS  . midazolam      . multivitamin with minerals  1 tablet Oral Daily  . nystatin  5 mL Oral QID  . predniSONE  5 mg Oral Daily  . sodium chloride  3 mL Intravenous Q12H  . tacrolimus  2 mg Sublingual BID  . vancomycin  1,250 mg Intravenous Q24H   Continuous Infusions: . sodium chloride    . sodium chloride 50 mL/hr (09/19/13 2210)  . Marland KitchenTPN (CLINIMIX-E) Adult 83 mL/hr at 09/20/13 1718   And  . fat emulsion 250 mL (09/20/13 1717)  . Marland KitchenTPN (CLINIMIX-E) Adult     And  . fat emulsion      Principal Problem:   HTN (hypertension) Active Problems:   History of heart transplant/chronic immunosupression   Head and neck cancer   Diverticulitis with perforation   Chronic diastolic heart failure   Pre-operative cardiovascular examination   Fever   Leukocytosis   CKD (chronic kidney disease) stage 3, GFR 30-59 ml/min   Malnutrition of moderate degree   Hypomagnesemia   Quality of life palliative care encounter    Time spent: 25 minutes.     Harvel Meskill  [facility] Pager [phone]. If 7PM-7AM, please contact night-coverage at [email or web address], password  [email or web address] 09/21/2013, 10:38 AM  LOS: 14 days

## 2013-09-21 NOTE — Progress Notes (Signed)
Temp of 102.9. Dr. Sunnie Nielsen and Dr Lindie Spruce notified, Tylenol given per order.

## 2013-09-22 LAB — COMPREHENSIVE METABOLIC PANEL
ALT: 26 U/L (ref 0–53)
AST: 27 U/L (ref 0–37)
Alkaline Phosphatase: 76 U/L (ref 39–117)
BUN: 30 mg/dL — ABNORMAL HIGH (ref 6–23)
Calcium: 7.9 mg/dL — ABNORMAL LOW (ref 8.4–10.5)
Chloride: 100 mEq/L (ref 96–112)
Glucose, Bld: 131 mg/dL — ABNORMAL HIGH (ref 70–99)
Potassium: 4.6 mEq/L (ref 3.5–5.1)
Sodium: 131 mEq/L — ABNORMAL LOW (ref 135–145)
Total Protein: 5.5 g/dL — ABNORMAL LOW (ref 6.0–8.3)

## 2013-09-22 LAB — CBC WITH DIFFERENTIAL/PLATELET
Basophils Absolute: 0 10*3/uL (ref 0.0–0.1)
Eosinophils Absolute: 0.1 10*3/uL (ref 0.0–0.7)
Eosinophils Relative: 1 % (ref 0–5)
Hemoglobin: 9.1 g/dL — ABNORMAL LOW (ref 13.0–17.0)
Lymphocytes Relative: 3 % — ABNORMAL LOW (ref 12–46)
Lymphs Abs: 0.3 10*3/uL — ABNORMAL LOW (ref 0.7–4.0)
MCH: 31.9 pg (ref 26.0–34.0)
MCV: 94.7 fL (ref 78.0–100.0)
Monocytes Absolute: 0.8 10*3/uL (ref 0.1–1.0)
Monocytes Relative: 8 % (ref 3–12)
Neutro Abs: 8.4 10*3/uL — ABNORMAL HIGH (ref 1.7–7.7)
Neutrophils Relative %: 88 % — ABNORMAL HIGH (ref 43–77)
Platelets: 426 10*3/uL — ABNORMAL HIGH (ref 150–400)
RBC: 2.85 MIL/uL — ABNORMAL LOW (ref 4.22–5.81)
RDW: 14.2 % (ref 11.5–15.5)
WBC: 9.5 10*3/uL (ref 4.0–10.5)

## 2013-09-22 LAB — MAGNESIUM: Magnesium: 1.9 mg/dL (ref 1.5–2.5)

## 2013-09-22 LAB — PREALBUMIN: Prealbumin: 8.4 mg/dL — ABNORMAL LOW (ref 17.0–34.0)

## 2013-09-22 MED ORDER — CALCIUM CARBONATE ANTACID 500 MG PO CHEW
1.0000 | CHEWABLE_TABLET | Freq: Three times a day (TID) | ORAL | Status: DC | PRN
  Administered 2013-09-22: 200 mg via ORAL
  Filled 2013-09-22: qty 1

## 2013-09-22 MED ORDER — TACROLIMUS 1 MG PO CAPS: 2.0000 mg | ORAL_CAPSULE | Freq: Two times a day (BID) | ORAL | Status: DC

## 2013-09-22 MED ORDER — SODIUM CHLORIDE 0.9 % IV SOLN: 100.0000 mg | Freq: Every day | INTRAVENOUS | Status: DC

## 2013-09-22 MED ORDER — ENSURE COMPLETE PO LIQD: 237.0000 mL | Freq: Three times a day (TID) | ORAL | Status: DC

## 2013-09-22 MED ORDER — VANCOMYCIN HCL 10 G IV SOLR: 1250.0000 mg | INTRAVENOUS | Status: DC

## 2013-09-22 MED ORDER — CLINIMIX E/DEXTROSE (5/15) 5 % IV SOLN
INTRAVENOUS | Status: DC
  Administered 2013-09-22: 19:00:00 via INTRAVENOUS
  Filled 2013-09-22: qty 2000

## 2013-09-22 MED ORDER — NYSTATIN 100000 UNIT/ML MT SUSP: 5.0000 mL | Freq: Four times a day (QID) | OROMUCOSAL | Status: DC

## 2013-09-22 MED ORDER — FAT EMULSION 20 % IV EMUL
250.0000 mL | INTRAVENOUS | Status: DC
  Administered 2013-09-22: 250 mL via INTRAVENOUS
  Filled 2013-09-22: qty 250

## 2013-09-22 MED ORDER — SODIUM CHLORIDE 0.9 % IV SOLN: 1.0000 g | Freq: Three times a day (TID) | INTRAVENOUS | Status: DC

## 2013-09-22 MED ORDER — LOPERAMIDE HCL 2 MG PO CAPS: 2.0000 mg | ORAL_CAPSULE | Freq: Four times a day (QID) | ORAL | Status: DC

## 2013-09-22 MED ORDER — MAGNESIUM SULFATE 40 MG/ML IJ SOLN
2.0000 g | Freq: Once | INTRAMUSCULAR | Status: AC
  Administered 2013-09-22: 2 g via INTRAVENOUS
  Filled 2013-09-22: qty 50

## 2013-09-22 MED ORDER — METOPROLOL TARTRATE 1 MG/ML IV SOLN: 5.0000 mg | Freq: Four times a day (QID) | INTRAVENOUS | Status: DC

## 2013-09-22 NOTE — Discharge Summary (Addendum)
Physician Discharge Summary  Peter Clarke:[id number] DOB: [birth year] DOA: 09/07/2013  PCP: [name], MD  Admit date: 09/07/2013 Discharge date: 09/22/2013  Time spent: 35 minutes  Recommendations for Outpatient Follow-up:  1. Please follow up abscess culture results.  2. Please consult general surgery.   Discharge Diagnoses:    Diverticulitis with perforation s/p laparoscopic assisted Hartman's procedure (sigmoid colectomy with end colostomy) w/ lysis of   adhesions.    History of heart transplant/chronic immunosuppression/history of Chronic   Systolic heart failure/New grade 2 DD    Acute on  CKD (chronic kidney disease) stage 3, GFR 30-59 ml/min   Chronic diastolic heart failure   HTN (hypertension)   History of heart transplant/chronic immunosupression   Head and neck cancer   Malnutrition of moderate degree   Hypomagnesemia   Quality of life palliative care encounter   Transfer to [facility].   Diet recommendation: Dysphagia 3 diet.   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 at [facility] currently undergoing chemotherapy and radiation therapy, CKD stage 3, presents with abdominal pain. He states his pain begun yesterday evening and "just him me". He admits to some slight diarrhea, but no melena or hematochezia. He denies any N/V.  He was transferred from [facility] due to findings of abnormal thickening of sigmoid colon with perforation. He has been evaluated by Dr. [name] of surgery with plans to treat conservatively with bowel rest and IV abx initially, but with a low threshold for surgery.  He is noted to have a WBC of 13k. He is also noted to have a Cr of 2.06,  increased from 1.57 on 9/3.    Hospital Course:  Patient admitted 9-21  with perforated diverticulitis diagnosed by CT scan. Patient underwent laparoscopic assisted Luz Brazen procedure(sigmoid colectomy with end colostomy) with lysis of adhesions. He was initially started on Invaz. Post operative patient spike fever at 102, antibiotics was change on 10-01 to Vancomycin and zosyn. Subsequently Zosyn was change to meropenem on 10-04 for aspergillus cover. He has been on micafungin since 10-02. Patient had a repeat CT scan 10-03 that showed: Fluid collection with small bubbles of air in the low central abdomen measuring 10.6 cm in greatest dimension. Please see full CT scan report. Patient underwent CT guide aspiration By IR on 10-05 with drainage catheter placement into mid abdomen yielding 130 cc of purulent material. Culture are pending at this time.  Patient has had poor oral intake. He has been on TNA since 10-04. Patient presents with acute on chronic renal failure, likely pre renal. Renal failure has respond to IV fluids. He is on NS 50 CC/hr and TPN.  Patient also developed delirium thought to be secondary to infection, hospital delirium. A ct head was negative.   1. Diverticulitis with perforation  -care as per Gen Surgery - now s/p laparoscopic assisted Hartman's procedure (sigmoid colectomy with end colostomy) w/ lysis of adhesions.  -Antibiotics broad to vancomycin on 10-01 due to spike in fever. Micafungin started 10-02. Meropenem Started 10-04.  -CT abdomen /pelvis 10-03; Show : Fluid collection with small bubbles of air in the low central abdomen measuring 10.6  cm in greatest dimension.  -S/P IR  CT guided drainage of abscess 10-05.  -Please follow culture results.  Recommendation from surgery team:  1. Cont abx therapy  2. cont dressing changes to wound  3. Cont imodium for high output from his colostomy  4. Encourage po intake  5. If needs transfer to [name], it's ok from our  standpoint  6. Will need repeat CT scan in 4-6 days to follow up intra-abdominal abscess.   2. History of heart transplant/chronic immunosuppression/history of Chronic Systolic heart failure/New grade 2 DD  -care as per CHF Team  -ECHO this admit showed grade 2 DD.  -Cardiology has been periodically updating transplant surgeon at [facility].  -Patient's CHF stable  -Patient on Prograf, 2 mg sub BID.  -Will defer lasix to Cardiologist. Chest x ray with pleural effusion.  -Patient to be transfer to [name] to be follow by transplant team and surgical team. Patient with complex medical care and immunosuppressive therapy.    3-Delirium:May be related to infection, hospital delirium. CT head negative. Ammonia level 29.  4-fever: antibiotics broad to vancomycin and zosyn day 2. Started on Micafungin 10-02. Chest x tay atelectasis. c diff negative. Blood culture no growth to date. CT abdomen/pelvis with fluid collection concern for developing abscess.  -Appreciate Dr hatcher recommendation. Zosyn change to meropenem.   5. Head and neck cancer  -Imuran dose decreased to 100 mg Daily in August when dx'd with throat cancer.  -continue home steroid dose.  6. Hypomagnesemia  Resolved. Replete with TPN.  7. Hypophosphatemia; stable. Replete with TPN.  8. HTN (hypertension)  -BP within AHA guidelines.  9. CKD (chronic kidney disease) stage 3, GFR 30-59 ml/min  -cr decrease to 1.2. Creatine on admission at 2.0. Monitor. Continue with IV fluids.  10. Malnutrition;  -Continue with TPN.  11-Hyponatremia; continue with IV fluids. Improving.    Procedures: 2-D echocardiogram  - Left ventricle: The cavity size was normal. Wall thickness was normal. Systolic function was normal. The estimated ejection fraction was in the range of 60% to 65%. Wall motion was normal; there were no regional wall motion abnormalities. Features are consistent with a pseudonormal left ventricular filling pattern, with  concomitant abnormal relaxation and increased filling pressure (grade 2 diastolic dysfunction). - Aortic valve: There was no stenosis. - Mitral valve: Trivial regurgitation. - Left atrium: The atrium was mildly dilated. - Right ventricle: Poorly visualized. The cavity size was normal. Systolic function was normal. - Right atrium: The atrium was mildly dilated. - Pulmonary arteries: No complete TR doppler jet so unable to estimate PA systolic pressure. - Systemic veins: IVC was not visualized.  09/07/2013 perforated sigmoid diverticulitis; S/P Laparoscopic assisted Hartman's procedure (sigmoid colectomy with end colostomy), lysis of adhesions x 1 hr by Dr. Gaynelle Adu (surgeon)   Consultations: Cardiology, Dr [name] General surgery, Dr [name].    Discharge Exam: Filed Vitals:   09/22/13 0506  BP: 146/77  Pulse: 103  Temp: 98.4 F (36.9 C)  Resp: 18    General: Alert, awake, confuse.  Cardiovascular: S 1, S 2 RRR Respiratory: CTA Abdomen: clean dressing, ostomy in place, drain in place.   Discharge Instructions  Discharge Orders   Future Orders Complete By Expires   Diet - low sodium heart healthy  As directed    Increase activity slowly  As directed        Medication List    STOP taking these medications       amLODipine 5 MG tablet  Commonly known as:  NORVASC     carvedilol 25 MG tablet  Commonly known as:  COREG      TAKE these medications       azaTHIOprine 50 MG tablet  Commonly known as:  IMURAN  Take 100 mg by mouth daily.     CENTRUM SILVER PO  Take 1 tablet by mouth daily.     feeding supplement Liqd  Take 237 mLs by mouth 3 (three) times daily between meals.     loperamide 2 MG capsule  Commonly known as:  IMODIUM  Take 1 capsule (2 mg total) by mouth 4 (four) times daily.     metoprolol 1 MG/ML injection  Commonly known as:  LOPRESSOR  Inject 5 mLs (5 mg total) into the vein every 6 (six) hours.     nystatin 100000 UNIT/ML  suspension  Commonly known as:  MYCOSTATIN  Take 5 mLs (500,000 Units total) by mouth 4 (four) times daily.     predniSONE 5 MG tablet  Commonly known as:  DELTASONE  Take 5 mg by mouth daily.     sodium chloride 0.9 % SOLN 100 mL with meropenem 1 G SOLR 1 g  Inject 1 g into the vein every 8 (eight) hours.     sodium chloride 0.9 % SOLN 100 mL with micafungin 50 MG SOLR 100 mg  Inject 100 mg into the vein at bedtime.     sodium chloride 0.9 % SOLN 250 mL with vancomycin 10 G SOLR 1,250 mg  Inject 1,250 mg into the vein daily.     tacrolimus 1 MG capsule  Commonly known as:  PROGRAF  Take 2 capsules (2 mg total) by mouth 2 (two) times daily.       Allergies  Allergen Reactions  . Pepto-Bismol [Bismuth Subsalicylate]     rash       Follow-up Information   Call Atilano Ina, MD. (after discharged from [facility])    Specialty:  General Surgery   Contact information:   [address] [place] [address] [phone]        The results of significant diagnostics from this hospitalization (including imaging, microbiology, ancillary and laboratory) are listed below for reference.    Significant Diagnostic Studies: Ct Abdomen Pelvis Wo Contrast  09/19/2013   CLINICAL DATA:  History of diverticulitis treated with surgery 10 days ago. Patient has fever and abdominal pain.  EXAM: CT ABDOMEN AND PELVIS WITHOUT CONTRAST  TECHNIQUE: Multidetector CT imaging of the abdomen and pelvis was performed following the standard protocol without intravenous contrast.  COMPARISON:  09/07/2013  FINDINGS: Since the prior CT, abdominal surgery has been performed. There is a midline defect along the skin and subcutaneous soft tissues extending to the fascia reflecting an open incision. The fascia was closed. A colostomy has been formed in the left lower quadrant. The sigmoid colon in its midportion has been ligated with a bowel anastomosis staple line.  There is a collection in the low mid  abdomen anterior to the aortic bifurcation and iliac vessels. It contains fluid and small nondependent bubbles of air. Its wall is somewhat ill-defined. It measures 6.8 cm x 2.5 cm by 10.6 cm in size. Along its right superior margin it abuts an area of hazy and nodular soft tissue density with interspersed foci of air. This lies in the right mid abdomen and is contiguous with similar opacity and air in the central abdomen. No other formed fluid collection is seen.  The left colon is mostly decompressed. The transverse and ascending colon are normal in caliber. There are air-fluid levels in this portion of the colon. No wall thickening. There is mild dilation of proximal small bowel without a discrete transition point. Small bowel air-fluid levels are also noted. This is likely due to a mild diffuse adynamic ileus.  There are small bilateral pleural effusions. There is dependent lower lobe atelectasis.  Multiple small low-density lesions are noted in the liver. These were present on the prior exam and were seen on exam dated 02/11/2009. They are likely cysts. The liver is otherwise unremarkable. Normal spleen. The gallbladder is distended. There are dependent gallstones. No gallbladder wall thickening or adjacent inflammation is seen. There is no bile duct dilation. Normal pancreas. No adrenal masses. There is mild bilateral renal cortical thinning. There are no renal masses or stones. No hydronephrosis. Normal ureters and bladder. The prostate is enlarged. There is a fat containing left inguinal hernia which stable.  There are degenerative changes of the visualized spine.  IMPRESSION: 1. Fluid collection with small bubbles of air in the low central abdomen measuring 10.6 cm in greatest dimension. This is consistent with an early abscess. There is abnormal extraluminal air and adjacent hazy and nodular soft tissue attenuation in the right mid abdomen that extends across the central abdomen. This has an inflammatory  appearance. The nodular areas within this may reflect prominent lymph nodes. No additional formed fluid collection is seen to suggest additional abscesses. 2. Air-fluid levels within the colon and small bowel with prominence of the proximal small bowel likely an adynamic ileus. No convincing obstruction. 3. Small bilateral pleural effusions with dependent lung base atelectasis. 4. Left lower quadrant colostomy. Hartman's pouch has been formed from the lower sigmoid colon. 5. Other chronic findings as detailed stable from the prior exams.   Electronically Signed   By: Amie Portland M.D.   On: 09/19/2013 16:04   Ct Abdomen Pelvis Wo Contrast  09/07/2013   *RADIOLOGY REPORT*  Clinical Data: Low abdomen pain.  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  CT ABDOMEN AND PELVIS WITHOUT CONTRAST  Technique:  Multidetector CT imaging of the abdomen and pelvis was performed following the standard protocol without intravenous contrast.  Comparison: February 11, 2009.  Findings: There is abnormal thickening of the sigmoid colon with perforation.  There is free air surrounding the sigmoid colon with air extending superiorly. There is no small bowel obstruction.  There are several low density lesions within the liver, most are present on the prior CT of February 06, 2009.  Evaluation is limited without contrast.  The spleen, pancreas, gallbladder, adrenal glands are normal.  There is no nephrolithiasis or hydroureternephrosis  bilaterally.  There is atherosclerosis of the abdominal aorta without aneurysmal dilatation.  Images of the pelvis demonstrate partial fluid filled bladder without abnormality.  Left inguinal herniation of mesenteric fat is identified measuring 5.6 cm.  There is mild scarring of the posterior lung bases with mild dependent atelectasis of the right lung base. There is minimal right pleural effusion . Degenerative joint changes of the spine are noted.  IMPRESSION:  Abnormal thickening of sigmoid colon with perforation. Differential diagnosis includes diverticulitis with perforation versus colon cancer with perforation.   Original Report Authenticated By: Sherian Rein, M.D.   Dg Chest 2 View  09/17/2013   *RADIOLOGY REPORT*  Clinical Data: Fever.  Hypertension.  CHEST - 2 VIEW  Comparison: Chest radiograph  09/09/2013.  Findings: Low lung volumes.  Stable cardiac and mediastinal contours status post median sternotomy.  Redemonstrated fractured superior sternotomy wire.  Bilateral lower lung heterogeneous pulmonary opacities.  Small bilateral pleural effusions.  Regional skeleton is unremarkable.  IMPRESSION: Small bilateral pleural effusions.  Low lung volumes.  Bibasilar opacities likely represent atelectasis.  Infection not excluded.   Original Report Authenticated By: Annia Belt, M.D   Ct Head Wo Contrast  09/17/2013   CLINICAL DATA:  Confusion  EXAM: CT HEAD WITHOUT CONTRAST  TECHNIQUE: Contiguous axial images were obtained from the base of the skull through the vertex without intravenous contrast.  COMPARISON:  CT head without contrast 08/20/2013  FINDINGS: No acute cortical infarct, hemorrhage, or mass lesion is present. The ventricles are of normal size. No significant extra-axial fluid collection is present.  A fluid level is again seen within the right maxillary sinus. The remaining paranasal sinuses and the mastoid air cells are clear. The osseous skull is intact.  IMPRESSION: 1. Normal CT appearance of the brain. 2. Right maxillary sinusitis.   Electronically Signed   By: Gennette Pac   On: 09/17/2013 20:34   Ct Guided Abscess Drain  09/21/2013   *RADIOLOGY REPORT*  Indication: Post colectomy with Hartmann's pouch formation and temporary loop ileostomy, now with an intra abdominal abscess.  CT GUIDED ABDOMINAL DRAINAGE CATHETER PLACEMENT  Comparison: CT abdomen pelvis - 09/19/2013; 09/07/2013  Medications: Fentanyl 50 mcg IV; Versed 2 mg IV  Total Moderate  Sedation time: 20 minutes  Contrast: None  Complications: None immediate  Technique / Findings:  Informed written consent was obtained from the patient after a discussion of the risks, benefits and alternatives to treatment. The patient was placed supine on the CT gantry and a pre procedural CT was performed re-demonstrating the known abscess/fluid collection within the midline of the abdomen measuring at least 5.6 x 3.1 cm (image one, series 2). The procedure was planned.   A timeout was performed prior to the initiation of the procedure.  The skin between the left lower quadrant ileostomy and midline open abdominal wound was prepped and draped in the usual sterile fashion.   The overlying soft tissues were anesthetized with 1% lidocaine with epinephrine.  Appropriate trajectory was planned with the use of a 22 gauge spinal needle.  An 18 gauge trocar needle was advanced into the abscess/fluid collection and a short Amplatz super stiff wire was coiled within the collection. Appropriate positioning was confirmed with a limited CT scan.  The tract was serially dilated allowing placement of a 10 [place] all- purpose drainage catheter.  Appropriate positioning was confirmed with a limited postprocedural CT scan.  Approximately 130 ml of purulent fluid was aspirated.  The tube was connected to a drainage bag and sutured in place.  A dressing was placed.  The patient tolerated the procedure well without immediate post procedural complication.  Impression:  Successful CT guided placement of a 10 French all purpose drain catheter into the midline intra abdominal abscess with aspiration of 130 mL of purulent fluid.  Samples were sent to the laboratory as requested by the ordering clinical team.   Original Report Authenticated By: Tacey Ruiz, MD   Dg Chest [facility]  09/09/2013   CLINICAL DATA:  Respiratory distress. History of head and neck cancer.  EXAM: PORTABLE CHEST - 1 VIEW  COMPARISON:  Single view of the chest  08/20/2013.  FINDINGS: The patient has bilateral effusions and diffuse airspace disease. No pneumothorax is  identified.  IMPRESSION: Diffuse bilateral airspace disease and pleural effusion most consistent with congestive heart failure. Coexistent pneumonia is also possible.   Electronically Signed   By: Drusilla Kanner M.D.   On: 09/09/2013 22:01    Microbiology: Recent Results (from the past 240 hour(s))  CULTURE, BLOOD (ROUTINE X 2)     Status: None   Collection Time    09/17/13  6:20 PM      Result Value Range Status   Specimen Description BLOOD LEFT HAND   Final   Special Requests BOTTLES DRAWN AEROBIC ONLY 10CC   Final   Culture  Setup Time     Final   Value: 09/18/2013 00:16     Performed at [facility]   Culture     Final   Value:        BLOOD CULTURE RECEIVED NO GROWTH TO DATE CULTURE WILL BE HELD FOR 5 DAYS BEFORE ISSUING A FINAL NEGATIVE REPORT     Performed at [facility]   Report Status PENDING   Incomplete  CULTURE, BLOOD (ROUTINE X 2)     Status: None   Collection Time    09/17/13  6:30 PM      Result Value Range Status   Specimen Description BLOOD LEFT ARM   Final   Special Requests BOTTLES DRAWN AEROBIC ONLY 10CC   Final   Culture  Setup Time     Final   Value: 09/18/2013 00:16     Performed at [facility]   Culture     Final   Value:        BLOOD CULTURE RECEIVED NO GROWTH TO DATE CULTURE WILL BE HELD FOR 5 DAYS BEFORE ISSUING A FINAL NEGATIVE REPORT     Performed at [facility]   Report Status PENDING   Incomplete  CLOSTRIDIUM DIFFICILE BY PCR     Status: None   Collection Time    09/17/13  6:59 PM      Result Value Range Status   C difficile by pcr NEGATIVE  NEGATIVE Final  CULTURE, ROUTINE-ABSCESS     Status: None   Collection Time    09/21/13 10:27 AM      Result Value Range Status   Specimen Description ABSCESS DRAINAGE ABDOMEN   Final   Special Requests     Final   Value: POST COLECOMY WITH DIVERTING ILEOSTOMY, NOW  WITH INTRA ABDOMINAL   Gram Stain PENDING   Incomplete   Culture     Final   Value: Culture reincubated for better growth     Performed at [facility]   Report Status PENDING   Incomplete     Labs: Basic Metabolic Panel:  Recent Labs Lab 09/17/13 0535 09/18/13 0612 09/19/13 0448 09/20/13 0516 09/21/13 1210 09/22/13 0500  NA 131* 129* 130* 132* 130* 131*  K 4.6 4.9 4.4 4.6 4.9 4.6  CL 102 101 99 100 99 100  CO2 19 19 19  18* 20 20  GLUCOSE 131* 130* 116* 136* 122* 131*  BUN 21 15 18 21  27* 30*  CREATININE 1.40* 1.55* 1.65* 1.49* 1.34 1.24  CALCIUM 7.9* 7.5* 8.2* 7.9* 8.0* 7.9*  MG 1.4* 1.4* 1.7 1.4* 1.7 1.9  PHOS 2.8 3.4 3.7 3.2  --  3.0   Liver Function Tests:  Recent Labs Lab 09/16/13 0720 09/17/13 0535 09/18/13 0612 09/19/13 0448 09/22/13 0500  AST 74* 68* 42* 32 27  ALT 48 54* 41 35 26  ALKPHOS 81  77 73 76 76  BILITOT 2.3* 1.7* 1.8* 1.9* 1.9*  PROT 5.7* 5.5* 5.1* 5.8* 5.5*  ALBUMIN 1.9* 1.8* 1.6* 1.9* 1.7*   No results found for this basename: LIPASE, AMYLASE,  in the last 168 hours  Recent Labs Lab 09/17/13 1829  AMMONIA 29   CBC:  Recent Labs Lab 09/18/13 0612 09/19/13 0448 09/20/13 0516 09/21/13 0500 09/22/13 0500  WBC 8.3 9.6 9.3 9.4 9.5  NEUTROABS 7.0 8.3* 8.0* 7.9* 8.4*  HGB 7.6* 9.8* 9.1* 9.2* 9.1*  HCT 22.3* 28.4* 26.9* 26.9* 27.0*  MCV 98.2 94.0 95.4 95.1 94.7  PLT 375 388 398 426* 426*   Cardiac Enzymes: No results found for this basename: CKTOTAL, CKMB, CKMBINDEX, TROPONINI,  in the last 168 hours BNP: BNP (last 3 results) No results found for this basename: PROBNP,  in the last 8760 hours CBG:  Recent Labs Lab 09/20/13 0609 09/20/13 1214 09/20/13 1824 09/20/13 2348 09/21/13 0623  GLUCAP 135* 149* 148* 130* 122*       Signed:  Geneveive Furness  [facility] 09/22/2013, 10:13 AM

## 2013-09-22 NOTE — Progress Notes (Signed)
Chaplain offered ministry of presence and support to the patient and his wife. The patient's minister was present in the room. Chaplain will follow up as needed or requested.   09/22/13 1100  Clinical Encounter Type  Visited With Patient and family together  Visit Type Initial;Social support

## 2013-09-22 NOTE — Progress Notes (Addendum)
. Advanced Heart Failure Team Consult Note  Referring Physician: Dr. [name] ([facility]) Primary Cardiologist:  Dr. 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  S/p partial colectomy on 9/23 with lysis of adhesions.    On 10/5 underwent CT guided drainage of mid abdominal abscess. Drain in place. Cx pending. [facility] does not feel that he has an anastomotic leak.  Still having ab pain. Did not sleep well.  Poor po intake. Febrile to 102.9 yesterday afternoon   Remains on vanc/meropenem/micafungin. Tacrolimus level pending for today.  Bcx 10/1 x 2  NGTD Abscess culture 10/5: pending BCx 106 x 2: pending UA 10/5: negative (+hematuria)  Objective:    Vital Signs:   Temp:  [97.3 F (36.3 C)-102.9 F (39.4 C)] 98.4 F (36.9 C) (10/06 0506) Pulse Rate:  [66-125] 103 (10/06 0506) Resp:  [11-22] 18 (10/06 0506) BP: (111-146)/(67-84) 146/77 mmHg (10/06 0506) SpO2:  [93 %-100 %] 96 % (10/06 0506) Last BM Date: 09/21/13  Weight change: Filed Weights   09/11/13 0400 09/11/13 1506 09/18/13 1547  Weight: 82.8 kg (182 lb 8.7 oz) 84 kg (185 lb 3 oz) 81.3 kg (179 lb 3.7 oz)    Intake/Output:   Intake/Output Summary (Last 24 hours) at 09/22/13 0944 Last data filed at 09/22/13 0508  Gross per 24 hour  Intake    585 ml  Output   2105 ml  Net  -1520 ml     Physical Exam: General:  Lying in bed. No distress.  HEENT: normal Neck: supple. JVP 6 . Carotids 2+ bilat; no bruits. No lymphadenopathy or thryomegaly appreciated. Cor: PMI nondisplaced. Regular rate & rhythm. No rubs, gallops or murmurs. Lungs: clear Abdomen: soft, diffusely tender. Worst in  RLQ. No rebound + colostomy. Good bs. + drain in place Extremities: no cyanosis, clubbing, rash, edema Neuro: alert & confused, cranial nerves grossly intact. moves all 4 extremities w/o difficulty. Affect pleasant  Telemetry: SR  Labs: Basic Metabolic Panel:  Recent Labs Lab 09/17/13 0535 09/18/13 0612 09/19/13 0448 09/20/13 0516 09/21/13 1210 09/22/13 0500  NA 131* 129* 130* 132* 130* 131*  K 4.6 4.9 4.4 4.6 4.9 4.6  CL 102 101 99 100 99 100  CO2 19 19 19  18* 20 20  GLUCOSE 131* 130* 116* 136* 122* 131*  BUN 21 15 18 21  27* 30*  CREATININE 1.40* 1.55* 1.65* 1.49* 1.34 1.24  CALCIUM 7.9* 7.5* 8.2* 7.9* 8.0* 7.9*  MG 1.4* 1.4* 1.7 1.4* 1.7 1.9  PHOS 2.8 3.4 3.7 3.2  --  3.0    Liver Function Tests:  Recent Labs Lab 09/16/13 0720 09/17/13 0535 09/18/13 0612 09/19/13 0448 09/22/13 0500  AST 74* 68* 42* 32 27  ALT 48 54* 41 35 26  ALKPHOS 81 77 73 76 76  BILITOT 2.3* 1.7* 1.8* 1.9* 1.9*  PROT 5.7* 5.5* 5.1* 5.8* 5.5*  ALBUMIN 1.9* 1.8* 1.6* 1.9* 1.7*   No results found for this basename: LIPASE, AMYLASE,  in the last 168 hours  Recent Labs Lab 09/17/13 1829  AMMONIA 29    CBC:  Recent Labs Lab 09/18/13 0612 09/19/13 0448 09/20/13 0516 09/21/13 0500 09/22/13 0500  WBC 8.3 9.6 9.3 9.4 9.5  NEUTROABS 7.0 8.3* 8.0* 7.9* 8.4*  HGB 7.6* 9.8* 9.1* 9.2* 9.1*  HCT 22.3* 28.4* 26.9* 26.9* 27.0*  MCV 98.2 94.0 95.4 95.1 94.7  PLT 375 388 398 426* 426*    Cardiac Enzymes: No results found for this basename: CKTOTAL, CKMB, CKMBINDEX, TROPONINI,  in the last 168 hours  BNP: BNP (last 3 results) No results found for this basename: PROBNP,  in the last 8760 hours  CBG:  Recent Labs Lab 09/20/13 0609 09/20/13 1214 09/20/13 1824 09/20/13 2348 09/21/13 0623  GLUCAP 135* 149* 148* 130* 122*    Coagulation Studies: No results found for this basename: LABPROT, INR,  in the last 72 hours Tacrolimus level: 6   Imaging: Ct Guided Abscess  Drain  09/21/2013   *RADIOLOGY REPORT*  Indication: Post colectomy with Hartmann's pouch formation and temporary loop ileostomy, now with an intra abdominal abscess.  CT GUIDED ABDOMINAL DRAINAGE CATHETER PLACEMENT  Comparison: CT abdomen pelvis - 09/19/2013; 09/07/2013  Medications: Fentanyl 50 mcg IV; Versed 2 mg IV  Total Moderate Sedation time: 20 minutes  Contrast: None  Complications: None immediate  Technique / Findings:  Informed written consent was obtained from the patient after a discussion of the risks, benefits and alternatives to treatment. The patient was placed supine on the CT gantry and a pre procedural CT was performed re-demonstrating the known abscess/fluid collection within the midline of the abdomen measuring at least 5.6 x 3.1 cm (image one, series 2). The procedure was planned.   A timeout was performed prior to the initiation of the procedure.  The skin between the left lower quadrant ileostomy and midline open abdominal wound was prepped and draped in the usual sterile fashion.   The overlying soft tissues were anesthetized with 1% lidocaine with epinephrine.  Appropriate trajectory was planned with the use of a 22 gauge spinal needle.  An 18 gauge trocar needle was advanced into the abscess/fluid collection and a short Amplatz super stiff wire was coiled within the collection. Appropriate positioning was confirmed with a limited CT scan.  The tract was serially dilated allowing placement of a 10 [place] all- purpose drainage catheter.  Appropriate positioning was confirmed with a limited postprocedural CT scan.  Approximately 130 ml of purulent fluid was aspirated.  The tube was connected to a drainage bag and sutured in place.  A dressing was placed.  The patient tolerated the procedure well without immediate post procedural complication.  Impression:  Successful CT guided placement of a 10 French all purpose drain catheter into the midline intra abdominal abscess with aspiration of 130  mL of purulent fluid.  Samples were sent to the laboratory as requested by the ordering clinical team.   Original Report Authenticated By: Tacey Ruiz, MD     Medications:     Current Medications: . azaTHIOprine  100 mg Oral Daily  . feeding supplement  237 mL Oral TID BM  . loperamide  2 mg Oral QID  . magnesium sulfate 1 - 4 g bolus IVPB  2 g Intravenous Once  . meropenem (MERREM) IV  1 g Intravenous Q8H  . metoprolol  5 mg Intravenous Q6H  . micafungin (MYCAMINE) IV  100 mg Intravenous QHS  . multivitamin with minerals  1 tablet Oral Daily  . nystatin  5 mL Oral QID  . predniSONE  5 mg Oral Daily  . sodium chloride  3 mL Intravenous Q12H  . tacrolimus  2 mg Sublingual BID  . vancomycin  1,250 mg  Intravenous Q24H    Infusions: . sodium chloride 20 mL/hr (09/22/13 0243)  . sodium chloride 50 mL/hr (09/19/13 2210)  . Marland KitchenTPN (CLINIMIX-E) Adult 83 mL/hr at 09/21/13 1731   And  . fat emulsion 250 mL (09/21/13 1731)  . Marland KitchenTPN (CLINIMIX-E) Adult     And  . fat emulsion       Assessment:   1. Acute diverticulitis with perforation s/p partial colectomy    --f/u CT with fluid collection suspicious for abscess 2. Chronic systolic HF s/p OHTx in 2003 ([name]) 3. Tonsillar cancer - currently undergoing Chemo/XRT 4.  chronic renal failure 5. Malnutrition, severe, albumin 1.6 6. Acute delirium 7. Hyponatremia  Plan/Discussion:    Continues to spike fevers despite broad spectrum coverage and CT-guided drainage of abscess. I have spoken with surgical team and [name]plant team. Will transfer to [name] to further manage intra-abdominal process in setting of previous Heart Transplant and immunosuppression. Tacrolimus level for today is pending. Suspect it may be high given rate of rise with last dose increase.   Accepting Physician at [name]: Dr. Paulino Rily. (He has notified [employer]).   Peter Clarke BensimhonMD 09/22/2013 9:44 AM

## 2013-09-22 NOTE — Progress Notes (Signed)
PARENTERAL NUTRITION CONSULT NOTE:  FOLLOW-UP  Pharmacy Consult:  TPN Indication:  Ileus  Allergies  Allergen Reactions  . Pepto-Bismol [Bismuth Subsalicylate]     rash    Patient Measurements: Height: 6' (182.9 cm) Weight: 179 lb 3.7 oz (81.3 kg) IBW/kg (Calculated) : 77.6 Usual Weight: 84 kg  Vital Signs: Temp: 98.4 F (36.9 C) (10/06 0506) Temp src: Oral (10/06 0506) BP: 146/77 mmHg (10/06 0506) Pulse Rate: 103 (10/06 0506) Intake/Output from previous day: 10/05 0701 - 10/06 0700 In: 585 [I.V.:580] Out: 2105 [Urine:1375; Drains:255; Stool:475]  Labs:  Recent Labs  09/20/13 0516 09/21/13 0500 09/22/13 0500  WBC 9.3 9.4 9.5  HGB 9.1* 9.2* 9.1*  HCT 26.9* 26.9* 27.0*  PLT 398 426* 426*     Recent Labs  09/20/13 0516 09/21/13 1210 09/22/13 0500  NA 132* 130* 131*  K 4.6 4.9 4.6  CL 100 99 100  CO2 18* 20 20  GLUCOSE 136* 122* 131*  BUN 21 27* 30*  CREATININE 1.49* 1.34 1.24  CALCIUM 7.9* 8.0* 7.9*  MG 1.4* 1.7 1.9  PHOS 3.2  --  3.0  PROT  --   --  5.5*  ALBUMIN  --   --  1.7*  AST  --   --  27  ALT  --   --  26  ALKPHOS  --   --  76  BILITOT  --   --  1.9*  TRIG  --   --  136   Estimated Creatinine Clearance: 63.4 ml/min (by C-G formula based on Cr of 1.24).    Recent Labs  09/20/13 1824 09/20/13 2348 09/21/13 0623  GLUCAP 148* 130* 122*   Insulin Requirements in the past 24 hours:  None - SSI dc'd  Assessment: 67 y.o. male s/p Hartman's procedure (sigmoid colectomy with end colostomy on 9/23 for perforated sigmoid diverticulitis). Patient with estimated PO intake <75% of estimated needs for ~3 weeks PTA.  His diet was advanced to dysphagia but remains unable to meet needs with PO intake due to very poor appetite.  TPN originally started for short-term nutritional support, now for ileus on 09/19/13 CT.   GI: 10/3 CT showed adynamic ileus and intra-abd fluid collection.  Was on dysphagia diet and PO intake remains poor, now NPO for perc  drain placement (removed of purulent material).  kCal count done 9/29 showed PO intake providing ~500 kCal, 14 gm protein. Ostomy O/P increased to 330mL/24h - on loperamide QID.  Baseline prealbumin 6.8 (low).  On multivitamin  Endo: No hx DM - pt required minimal SSI, now dc'd  Lytes: hyponatremia, magnesium continues to stabilize at 1.9 today -  Watch K+ as tacrolimus could contribute to increased K+ (down slightly today to 4.6)  Renal: CKD stage 3 - SCr improved to 1.24, good UOP  Pulm: stable on 96% RA - pleural effusion on CT  Cards: hx HTN.  S/p heart transplant [facility] 2003); continues on immunosuppressive therapy with tacro (level 8.6 on 10/2 - at goal; repeat level in process), azathioprine, prednisone.  ECHO shows diastolic dysfunction.  BP 146/77, HR 103, metoprolol IV scheduled - possible transfer to [name]  Hepatobil: LFTS WNL, tbili 1.9.  TG down to 136  Neuro: confusion (likely delirium) so unable to participate in goals of care meeting.   ID: Vanc D#6 + Merrem D#3 + Mycamine D#5, empiric for fever.  10/3 CT showed early abscess.  Ertapenem 9/21 >> 10/1, also on Nystatin QID.  Tmax 102.9, WBC WNL,  C.diff negative.  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  Hgb appears to be stabilizing, s/p transfusion 10/2, plts high at 426  Best Practices: heparin held  TPN Access: PICC placed 10/2  TPN day#: 4 (10/2 >> )  Current Nutrition:  Dysphagia diet (minimal intake) Ensure supplements TID (received 1 yesterday) Clinimix E 5/15 at 83 ml/hr + IVFE at 10 ml/hr  Nutritional Goals:  2000-2200 kCal, 100-110 grams of protein per day per RD 10/2  Plan:  - Continue Clinimix E 5/15 at goal rate of 83 ml/hr and IVFE 20% at 10 ml/hr.  TPN provides 1894 kCal and 100gm protein per day, meeting 95% of minimal kCal and 100% of minimal protein needs - No multivitamin or  trace elements in TPN as pt taking PO multivitamin - Repeat Mag sulfate 2gm IV x 1 - F/U AM labs - F/u transfer plans  Lysle Pearl, PharmD, BCPS Pager # [phone] 09/22/2013 9:27 AM

## 2013-09-22 NOTE — Progress Notes (Signed)
Pt showed increased confusion, agitation, and restlessness. Vital signs remained stable throughout shift. Pain medication given for abdominal  discomfort. Pt unable to sleep due to increased restlessness and confusion. Pt stated that he was unsure what the plan of his care was. Physician assistant notified and stated will follow up with day time physician. Will continue to monitor pt and report any new changes.

## 2013-09-22 NOTE — Progress Notes (Signed)
Patient ID: Peter Clarke, male   DOB: [birth year], 67 y.o.   MRN: [id number] 13 Days Post-Op  Subjective: Pt c/o some abdominal pain today.  Still not eating.  Objective: Vital signs in last 24 hours: Temp:  [97.3 F (36.3 C)-102.9 F (39.4 C)] 98.4 F (36.9 C) (10/06 0506) Pulse Rate:  [66-125] 103 (10/06 0506) Resp:  [11-22] 18 (10/06 0506) BP: (111-146)/(67-88) 146/77 mmHg (10/06 0506) SpO2:  [93 %-100 %] 96 % (10/06 0506) Last BM Date: 09/21/13  Intake/Output from previous day: 10/05 0701 - 10/06 0700 In: 585 [I.V.:580] Out: 2105 [Urine:1375; Drains:255; Stool:475] Intake/Output this shift:    PE: Abd: soft, drain in place with tan clear output, ostomy intact with bilious output, wound is clean.  Small amount of fibrin at base in middle and inferior portion of wound.  Lab Results:   Recent Labs  09/21/13 0500 09/22/13 0500  WBC 9.4 9.5  HGB 9.2* 9.1*  HCT 26.9* 27.0*  PLT 426* 426*   BMET  Recent Labs  09/21/13 1210 09/22/13 0500  NA 130* 131*  K 4.9 4.6  CL 99 100  CO2 20 20  GLUCOSE 122* 131*  BUN 27* 30*  CREATININE 1.34 1.24  CALCIUM 8.0* 7.9*   PT/INR No results found for this basename: LABPROT, INR,  in the last 72 hours CMP     Component Value Date/Time   NA 131* 09/22/2013 0500   K 4.6 09/22/2013 0500   CL 100 09/22/2013 0500   CO2 20 09/22/2013 0500   GLUCOSE 131* 09/22/2013 0500   BUN 30* 09/22/2013 0500   CREATININE 1.24 09/22/2013 0500   CREATININE 1.99* 06/14/2013 0825   CALCIUM 7.9* 09/22/2013 0500   PROT 5.5* 09/22/2013 0500   ALBUMIN 1.7* 09/22/2013 0500   AST 27 09/22/2013 0500   ALT 26 09/22/2013 0500   ALKPHOS 76 09/22/2013 0500   BILITOT 1.9* 09/22/2013 0500   GFRNONAA 58* 09/22/2013 0500   GFRAA 68* 09/22/2013 0500   Lipase  No results found for this basename: lipase       Studies/Results: Ct Guided Abscess Drain  09/21/2013   *RADIOLOGY REPORT*  Indication: Post colectomy with Hartmann's pouch formation and temporary loop  ileostomy, now with an intra abdominal abscess.  CT GUIDED ABDOMINAL DRAINAGE CATHETER PLACEMENT  Comparison: CT abdomen pelvis - 09/19/2013; 09/07/2013  Medications: Fentanyl 50 mcg IV; Versed 2 mg IV  Total Moderate Sedation time: 20 minutes  Contrast: None  Complications: None immediate  Technique / Findings:  Informed written consent was obtained from the patient after a discussion of the risks, benefits and alternatives to treatment. The patient was placed supine on the CT gantry and a pre procedural CT was performed re-demonstrating the known abscess/fluid collection within the midline of the abdomen measuring at least 5.6 x 3.1 cm (image one, series 2). The procedure was planned.   A timeout was performed prior to the initiation of the procedure.  The skin between the left lower quadrant ileostomy and midline open abdominal wound was prepped and draped in the usual sterile fashion.   The overlying soft tissues were anesthetized with 1% lidocaine with epinephrine.  Appropriate trajectory was planned with the use of a 22 gauge spinal needle.  An 18 gauge trocar needle was advanced into the abscess/fluid collection and a short Amplatz super stiff wire was coiled within the collection. Appropriate positioning was confirmed with a limited CT scan.  The tract was serially dilated allowing placement of a 10 [place]  all- purpose drainage catheter.  Appropriate positioning was confirmed with a limited postprocedural CT scan.  Approximately 130 ml of purulent fluid was aspirated.  The tube was connected to a drainage bag and sutured in place.  A dressing was placed.  The patient tolerated the procedure well without immediate post procedural complication.  Impression:  Successful CT guided placement of a 10 French all purpose drain catheter into the midline intra abdominal abscess with aspiration of 130 mL of purulent fluid.  Samples were sent to the laboratory as requested by the ordering clinical team.   Original  Report Authenticated By: Tacey Ruiz, MD    Anti-infectives: Anti-infectives   Start     Dose/Rate Route Frequency Ordered Stop   09/20/13 1600  meropenem (MERREM) 1 g in sodium chloride 0.9 % 100 mL IVPB     1 g 200 mL/hr over 30 Minutes Intravenous Every 8 hours 09/20/13 1451     09/18/13 2300  micafungin (MYCAMINE) 100 mg in sodium chloride 0.9 % 100 mL IVPB     100 mg 100 mL/hr over 1 Hours Intravenous Daily at bedtime 09/18/13 2235     09/17/13 1800  piperacillin-tazobactam (ZOSYN) IVPB 3.375 g  Status:  Discontinued     3.375 g 12.5 mL/hr over 240 Minutes Intravenous Every 8 hours 09/17/13 1725 09/20/13 1434   09/17/13 1800  vancomycin (VANCOCIN) 1,250 mg in sodium chloride 0.9 % 250 mL IVPB     1,250 mg 166.7 mL/hr over 90 Minutes Intravenous Every 24 hours 09/17/13 1725     09/07/13 2000  ertapenem (INVANZ) 1 g in sodium chloride 0.9 % 50 mL IVPB  Status:  Discontinued     1 g 100 mL/hr over 30 Minutes Intravenous Every 24 hours 09/07/13 1914 09/17/13 1715   09/07/13 1430  piperacillin-tazobactam (ZOSYN) IVPB 3.375 g     3.375 g 12.5 mL/hr over 240 Minutes Intravenous  Once 09/07/13 1425 09/07/13 1516   09/07/13 1430  metroNIDAZOLE (FLAGYL) IVPB 500 mg     500 mg 100 mL/hr over 60 Minutes Intravenous  Once 09/07/13 1425 09/07/13 1617       Assessment/Plan  1. S/p Hartman's for perf tics 2. Post op abscess, s/p perc drain 3. Heart transplant 4. Head and neck cancer  Plan: 1. Cont abx therapy 2. cont dressing changes to wound 3. Cont imodium for high output from his colostomy 4. Encourage po intake 5. If needs transfer to [name], it's ok from our standpoint 6. Will need repeat CT scan in 4-6 days to follow up intra-abdominal abscess.   LOS: 15 days    Deanda Ruddell E 09/22/2013, 9:29 AM Pager: [phone]

## 2013-09-22 NOTE — Progress Notes (Signed)
ANTIBIOTIC CONSULT NOTE - FOLLOW UP  Pharmacy Consult for Vancomycin + Meropenem Indication: abdominal abscess  Allergies  Allergen Reactions  . Pepto-Bismol [Bismuth Subsalicylate]     rash    Patient Measurements: Height: 6' (182.9 cm) Weight: 179 lb 3.7 oz (81.3 kg) IBW/kg (Calculated) : 77.6  Vital Signs: Temp: 98.4 F (36.9 C) (10/06 0506) Temp src: Oral (10/06 0506) BP: 146/77 mmHg (10/06 0506) Pulse Rate: 103 (10/06 0506) Intake/Output from previous day: 10/05 0701 - 10/06 0700 In: 585 [I.V.:580] Out: 2105 [Urine:1375; Drains:255; Stool:475] Intake/Output from this shift:    Labs:  Recent Labs  09/20/13 0516 09/21/13 0500 09/21/13 1210 09/22/13 0500  WBC 9.3 9.4  --  9.5  HGB 9.1* 9.2*  --  9.1*  PLT 398 426*  --  426*  CREATININE 1.49*  --  1.34 1.24   Estimated Creatinine Clearance: 63.4 ml/min (by C-G formula based on Cr of 1.24).  Recent Labs  09/20/13 1730  VANCOTROUGH 11.8    Assessment: 67yom continues on vancomycin (day#6), meropenem (day#3), and micafungin (day#5) for abdominal abscess. Renal function continues to improved, UOP is good. Vancomycin trough on 10/4 was therapeutic on 1250mg  q24.  9/21 Ertapenem>>10/1 10/1 Vancomycin>> 10/4 Meropenem>> 102 Micafungin>>  10/1 blood>>ngtd 10/5 abscess>>ngtd 10/6 blood>>ngtd  Goal of Therapy:  Vancomycin trough level 10-15 Appropriate meropenem dosing  Plan:  1) Continue vancomycin 1250mg  IV q24 2) Continue meropenem 1g IV q8 3) Continue micafungin 100mg  IV q24  Fredrik Rigger 09/22/2013,2:13 PM

## 2013-09-22 NOTE — Progress Notes (Signed)
PT Cancellation Note  Patient Details Name: Peter Clarke MRN: [id number] DOB: 1946/05/11   Cancelled Treatment:    Reason Eval/Treat Not Completed: Patient declined (pt did not sleep well last night, deferring PT at this time)   Fabio Asa 09/22/2013, 9:53 AM

## 2013-09-22 NOTE — Progress Notes (Addendum)
13 Days Post-Op  Subjective: abd abscess drain placed 10/5 Good output Pt still  painful  Objective: Vital signs in last 24 hours: Temp:  [97.3 F (36.3 C)-102.9 F (39.4 C)] 98.4 F (36.9 C) (10/06 0506) Pulse Rate:  [66-125] 103 (10/06 0506) Resp:  [11-22] 18 (10/06 0506) BP: (111-146)/(67-88) 146/77 mmHg (10/06 0506) SpO2:  [93 %-100 %] 96 % (10/06 0506) Last BM Date: 09/21/13  Intake/Output from previous day: 10/05 0701 - 10/06 0700 In: 585 [I.V.:580] Out: 2105 [Urine:1375; Drains:255; Stool:475] Intake/Output this shift:    PE:  T max 100.1 VSS abd abscess drain intact Site clean and dry; NT Output 255 cc yesterday 20 cc in bag now- dark yellow color Cx pending  Lab Results:   Recent Labs  09/21/13 0500 09/22/13 0500  WBC 9.4 9.5  HGB 9.2* 9.1*  HCT 26.9* 27.0*  PLT 426* 426*   BMET  Recent Labs  09/21/13 1210 09/22/13 0500  NA 130* 131*  K 4.9 4.6  CL 99 100  CO2 20 20  GLUCOSE 122* 131*  BUN 27* 30*  CREATININE 1.34 1.24  CALCIUM 8.0* 7.9*   PT/INR No results found for this basename: LABPROT, INR,  in the last 72 hours ABG No results found for this basename: PHART, PCO2, PO2, HCO3,  in the last 72 hours  Studies/Results: Ct Guided Abscess Drain  09/21/2013   *RADIOLOGY REPORT*  Indication: Post colectomy with Hartmann's pouch formation and temporary loop ileostomy, now with an intra abdominal abscess.  CT GUIDED ABDOMINAL DRAINAGE CATHETER PLACEMENT  Comparison: CT abdomen pelvis - 09/19/2013; 09/07/2013  Medications: Fentanyl 50 mcg IV; Versed 2 mg IV  Total Moderate Sedation time: 20 minutes  Contrast: None  Complications: None immediate  Technique / Findings:  Informed written consent was obtained from the patient after a discussion of the risks, benefits and alternatives to treatment. The patient was placed supine on the CT gantry and a pre procedural CT was performed re-demonstrating the known abscess/fluid collection within the midline of  the abdomen measuring at least 5.6 x 3.1 cm (image one, series 2). The procedure was planned.   A timeout was performed prior to the initiation of the procedure.  The skin between the left lower quadrant ileostomy and midline open abdominal wound was prepped and draped in the usual sterile fashion.   The overlying soft tissues were anesthetized with 1% lidocaine with epinephrine.  Appropriate trajectory was planned with the use of a 22 gauge spinal needle.  An 18 gauge trocar needle was advanced into the abscess/fluid collection and a short Amplatz super stiff wire was coiled within the collection. Appropriate positioning was confirmed with a limited CT scan.  The tract was serially dilated allowing placement of a 10 [place] all- purpose drainage catheter.  Appropriate positioning was confirmed with a limited postprocedural CT scan.  Approximately 130 ml of purulent fluid was aspirated.  The tube was connected to a drainage bag and sutured in place.  A dressing was placed.  The patient tolerated the procedure well without immediate post procedural complication.  Impression:  Successful CT guided placement of a 10 French all purpose drain catheter into the midline intra abdominal abscess with aspiration of 130 mL of purulent fluid.  Samples were sent to the laboratory as requested by the ordering clinical team.   Original Report Authenticated By: Tacey Ruiz, MD    Anti-infectives: Anti-infectives   Start     Dose/Rate Route Frequency Ordered Stop   09/20/13  1600  meropenem (MERREM) 1 g in sodium chloride 0.9 % 100 mL IVPB     1 g 200 mL/hr over 30 Minutes Intravenous Every 8 hours 09/20/13 1451     09/18/13 2300  micafungin (MYCAMINE) 100 mg in sodium chloride 0.9 % 100 mL IVPB     100 mg 100 mL/hr over 1 Hours Intravenous Daily at bedtime 09/18/13 2235     09/17/13 1800  piperacillin-tazobactam (ZOSYN) IVPB 3.375 g  Status:  Discontinued     3.375 g 12.5 mL/hr over 240 Minutes Intravenous Every 8  hours 09/17/13 1725 09/20/13 1434   09/17/13 1800  vancomycin (VANCOCIN) 1,250 mg in sodium chloride 0.9 % 250 mL IVPB     1,250 mg 166.7 mL/hr over 90 Minutes Intravenous Every 24 hours 09/17/13 1725     09/07/13 2000  ertapenem (INVANZ) 1 g in sodium chloride 0.9 % 50 mL IVPB  Status:  Discontinued     1 g 100 mL/hr over 30 Minutes Intravenous Every 24 hours 09/07/13 1914 09/17/13 1715   09/07/13 1430  piperacillin-tazobactam (ZOSYN) IVPB 3.375 g     3.375 g 12.5 mL/hr over 240 Minutes Intravenous  Once 09/07/13 1425 09/07/13 1516   09/07/13 1430  metroNIDAZOLE (FLAGYL) IVPB 500 mg     500 mg 100 mL/hr over 60 Minutes Intravenous  Once 09/07/13 1425 09/07/13 1617      Assessment/Plan: s/p Procedure(s): Lap. Assisted Colectomy, lysis of adhesions, Colostomy (N/A)   Adb abscess drain placed in IR 10/5 Output good; drain intact Will follow   LOS: 15 days    Deloma Spindle A 09/22/2013

## 2013-09-22 NOTE — Progress Notes (Signed)
Wound looks OK. Drain working. Did not eat breakfast. He has requested to be transferred to [facility]. Plan F/U CT once drain output decreases, continue ABX. Patient examined and I agree with the assessment and plan I spoke to his wife as well. Violeta Gelinas, MD, MPH, FACS Pager: [phone]  09/22/2013 9:37 AM

## 2013-09-22 NOTE — Discharge Instructions (Signed)
-   Continue normal saline wet to dry dressing changes to abdominal wound twice a day - routine ostomy care.  Patient is having high output from his colostomy due to inadequate oral intake.  Continue imodium four times a day - Will need repeat CT to follow up intra-abdominal fluid collection in 4-6 days.  Last scan here on 09-21-13 for placement.  Cultures are pending. - TNA was placed by palliative care medicine to assist with nutrition while patient has no appetite and difficulty eating secondary to head and neck cancer

## 2013-09-22 NOTE — Progress Notes (Signed)
[facility]  Date of Admission:  09/07/2013  Antibiotics: Antibiotics Given (last 72 hours)   Date/Time Action Medication Dose Rate   09/19/13 1812 Given   piperacillin-tazobactam (ZOSYN) IVPB 3.375 g 3.375 g 12.5 mL/hr   09/19/13 1812 Given   vancomycin (VANCOCIN) 1,250 mg in sodium chloride 0.9 % 250 mL IVPB 1,250 mg 166.7 mL/hr   09/20/13 0201 Given   piperacillin-tazobactam (ZOSYN) IVPB 3.375 g 3.375 g 12.5 mL/hr   09/20/13 1478 Given   piperacillin-tazobactam (ZOSYN) IVPB 3.375 g 3.375 g 12.5 mL/hr   09/20/13 1558 Given   meropenem (MERREM) 1 g in sodium chloride 0.9 % 100 mL IVPB 1 g 200 mL/hr   09/20/13 1806 Given   vancomycin (VANCOCIN) 1,250 mg in sodium chloride 0.9 % 250 mL IVPB 1,250 mg 166.7 mL/hr   09/21/13 0104 Given   meropenem (MERREM) 1 g in sodium chloride 0.9 % 100 mL IVPB 1 g 200 mL/hr   09/21/13 0801 Given   meropenem (MERREM) 1 g in sodium chloride 0.9 % 100 mL IVPB 1 g 200 mL/hr   09/21/13 1631 Given   meropenem (MERREM) 1 g in sodium chloride 0.9 % 100 mL IVPB 1 g 200 mL/hr   09/21/13 1814 Given   vancomycin (VANCOCIN) 1,250 mg in sodium chloride 0.9 % 250 mL IVPB 1,250 mg 166.7 mL/hr   09/22/13 0118 Given   meropenem (MERREM) 1 g in sodium chloride 0.9 % 100 mL IVPB 1 g 200 mL/hr   09/22/13 1031 Given   meropenem (MERREM) 1 g in sodium chloride 0.9 % 100 mL IVPB 1 g 200 mL/hr      Subjective: No acute complaints, confused  Objective: Temp:  [98.4 F (36.9 C)-102.9 F (39.4 C)] 98.4 F (36.9 C) (10/06 0506) Pulse Rate:  [66-117] 103 (10/06 0506) Resp:  [18-22] 18 (10/06 0506) BP: (129-146)/(70-77) 146/77 mmHg (10/06 0506) SpO2:  [93 %-97 %] 96 % (10/06 0506)  General: awake, confused to place and time Skin: no rashes Lungs: CTA B Cor: Tachy rr Abdomen: + colostomy, + abscess drain Ext: no edema  Lab Results Lab Results  Component Value Date   WBC 9.5 09/22/2013   HGB 9.1* 09/22/2013   HCT 27.0* 09/22/2013     MCV 94.7 09/22/2013   PLT 426* 09/22/2013    Lab Results  Component Value Date   CREATININE 1.24 09/22/2013   BUN 30* 09/22/2013   NA 131* 09/22/2013   K 4.6 09/22/2013   CL 100 09/22/2013   CO2 20 09/22/2013    Lab Results  Component Value Date   ALT 26 09/22/2013   AST 27 09/22/2013   ALKPHOS 76 09/22/2013   BILITOT 1.9* 09/22/2013      Microbiology: Recent Results (from the past 240 hour(s))  CULTURE, BLOOD (ROUTINE X 2)     Status: None   Collection Time    09/17/13  6:20 PM      Result Value Range Status   Specimen Description BLOOD LEFT HAND   Final   Special Requests BOTTLES DRAWN AEROBIC ONLY 10CC   Final   Culture  Setup Time     Final   Value: 09/18/2013 00:16     Performed at [facility]   Culture     Final   Value:        BLOOD CULTURE RECEIVED NO GROWTH TO DATE CULTURE WILL BE HELD FOR 5 DAYS BEFORE ISSUING A FINAL NEGATIVE REPORT  Performed at [facility]   Report Status PENDING   Incomplete  CULTURE, BLOOD (ROUTINE X 2)     Status: None   Collection Time    09/17/13  6:30 PM      Result Value Range Status   Specimen Description BLOOD LEFT ARM   Final   Special Requests BOTTLES DRAWN AEROBIC ONLY 10CC   Final   Culture  Setup Time     Final   Value: 09/18/2013 00:16     Performed at [facility]   Culture     Final   Value:        BLOOD CULTURE RECEIVED NO GROWTH TO DATE CULTURE WILL BE HELD FOR 5 DAYS BEFORE ISSUING A FINAL NEGATIVE REPORT     Performed at [facility]   Report Status PENDING   Incomplete  CLOSTRIDIUM DIFFICILE BY PCR     Status: None   Collection Time    09/17/13  6:59 PM      Result Value Range Status   C difficile by pcr NEGATIVE  NEGATIVE Final  CULTURE, ROUTINE-ABSCESS     Status: None   Collection Time    09/21/13 10:27 AM      Result Value Range Status   Specimen Description ABSCESS DRAINAGE ABDOMEN   Final   Special Requests     Final   Value: POST COLECOMY WITH DIVERTING ILEOSTOMY, NOW WITH  INTRA ABDOMINAL   Gram Stain PENDING   Incomplete   Culture     Final   Value: Culture reincubated for better growth     Performed at [facility]   Report Status PENDING   Incomplete    Studies/Results: Ct Guided Abscess Drain  09/21/2013   *RADIOLOGY REPORT*  Indication: Post colectomy with Hartmann's pouch formation and temporary loop ileostomy, now with an intra abdominal abscess.  CT GUIDED ABDOMINAL DRAINAGE CATHETER PLACEMENT  Comparison: CT abdomen pelvis - 09/19/2013; 09/07/2013  Medications: Fentanyl 50 mcg IV; Versed 2 mg IV  Total Moderate Sedation time: 20 minutes  Contrast: None  Complications: None immediate  Technique / Findings:  Informed written consent was obtained from the patient after a discussion of the risks, benefits and alternatives to treatment. The patient was placed supine on the CT gantry and a pre procedural CT was performed re-demonstrating the known abscess/fluid collection within the midline of the abdomen measuring at least 5.6 x 3.1 cm (image one, series 2). The procedure was planned.   A timeout was performed prior to the initiation of the procedure.  The skin between the left lower quadrant ileostomy and midline open abdominal wound was prepped and draped in the usual sterile fashion.   The overlying soft tissues were anesthetized with 1% lidocaine with epinephrine.  Appropriate trajectory was planned with the use of a 22 gauge spinal needle.  An 18 gauge trocar needle was advanced into the abscess/fluid collection and a short Amplatz super stiff wire was coiled within the collection. Appropriate positioning was confirmed with a limited CT scan.  The tract was serially dilated allowing placement of a 10 [place] all- purpose drainage catheter.  Appropriate positioning was confirmed with a limited postprocedural CT scan.  Approximately 130 ml of purulent fluid was aspirated.  The tube was connected to a drainage bag and sutured in place.  A dressing was placed.   The patient tolerated the procedure well without immediate post procedural complication.  Impression:  Successful CT guided placement of a 10 [place]  all purpose drain catheter into the midline intra abdominal abscess with aspiration of 130 mL of purulent fluid.  Samples were sent to the laboratory as requested by the ordering clinical team.   Original Report Authenticated By: Tacey Ruiz, MD    Assessment/Plan: 1)  Abscess - no growth or gram stain yet.  On broad spectrum antibiotics.  Continue with current antibiotics.  2) dispo - to [name] transplant team today.    Staci Righter, MD [facility] [email or web address] [phone] pager   [phone] cell 09/22/2013, 12:36 PM

## 2013-09-23 LAB — BASIC METABOLIC PANEL
BUN: 28 mg/dL — ABNORMAL HIGH (ref 6–23)
CO2: 21 mEq/L (ref 19–32)
Chloride: 99 mEq/L (ref 96–112)
GFR calc non Af Amer: 65 mL/min — ABNORMAL LOW (ref >90)
Glucose, Bld: 130 mg/dL — ABNORMAL HIGH (ref 70–99)
Potassium: 4.8 mEq/L (ref 3.5–5.1)
Sodium: 130 mEq/L — ABNORMAL LOW (ref 135–145)

## 2013-09-23 LAB — CBC WITH DIFFERENTIAL/PLATELET
Basophils Absolute: 0 10*3/uL (ref 0.0–0.1)
Eosinophils Absolute: 0.1 10*3/uL (ref 0.0–0.7)
Hemoglobin: 8.9 g/dL — ABNORMAL LOW (ref 13.0–17.0)
Lymphocytes Relative: 3 % — ABNORMAL LOW (ref 12–46)
Lymphs Abs: 0.4 10*3/uL — ABNORMAL LOW (ref 0.7–4.0)
Monocytes Relative: 8 % (ref 3–12)
Neutrophils Relative %: 88 % — ABNORMAL HIGH (ref 43–77)
Platelets: 413 10*3/uL — ABNORMAL HIGH (ref 150–400)
RBC: 2.75 MIL/uL — ABNORMAL LOW (ref 4.22–5.81)
RDW: 14.6 % (ref 11.5–15.5)
WBC: 11.1 10*3/uL — ABNORMAL HIGH (ref 4.0–10.5)

## 2013-09-23 MED ORDER — CLINIMIX E/DEXTROSE (5/15) 5 % IV SOLN
INTRAVENOUS | Status: DC
  Filled 2013-09-23: qty 2000

## 2013-09-23 MED ORDER — FAT EMULSION 20 % IV EMUL
250.0000 mL | INTRAVENOUS | Status: DC
  Filled 2013-09-23: qty 250

## 2013-09-23 NOTE — Progress Notes (Signed)
PARENTERAL NUTRITION CONSULT NOTE:  FOLLOW-UP  Pharmacy Consult:  TPN Indication:  Ileus  Allergies  Allergen Reactions  . Pepto-Bismol [Bismuth Subsalicylate]     rash    Patient Measurements: Height: 6' (182.9 cm) Weight: 179 lb 3.7 oz (81.3 kg) IBW/kg (Calculated) : 77.6 Usual Weight: 84 kg  Vital Signs: Temp: 99.6 F (37.6 C) (10/07 0500) Temp src: Oral (10/07 0500) BP: 140/83 mmHg (10/07 0500) Pulse Rate: 110 (10/07 0500) Intake/Output from previous day: 10/06 0701 - 10/07 0700 In: 2009.8 [I.V.:773.3; IV Piggyback:200; TPN:1021.5] Out: 5755 [Urine:4700; Drains:55; Stool:1000]  Labs:  Recent Labs  09/21/13 0500 09/22/13 0500 09/23/13 0459  WBC 9.4 9.5 11.1*  HGB 9.2* 9.1* 8.9*  HCT 26.9* 27.0* 26.2*  PLT 426* 426* 413*     Recent Labs  09/21/13 1210 09/22/13 0500 09/23/13 0459  NA 130* 131* 130*  K 4.9 4.6 4.8  CL 99 100 99  CO2 20 20 21   GLUCOSE 122* 131* 130*  BUN 27* 30* 28*  CREATININE 1.34 1.24 1.13  CALCIUM 8.0* 7.9* 8.2*  MG 1.7 1.9 1.8  PHOS  --  3.0  --   PROT  --  5.5*  --   ALBUMIN  --  1.7*  --   AST  --  27  --   ALT  --  26  --   ALKPHOS  --  76  --   BILITOT  --  1.9*  --   PREALBUMIN  --  8.4*  --   TRIG  --  136  --    Estimated Creatinine Clearance: 69.6 ml/min (by C-G formula based on Cr of 1.13).    Recent Labs  09/20/13 1824 09/20/13 2348 09/21/13 0623  GLUCAP 148* 130* 122*   Insulin Requirements in the past 24 hours:  None - SSI dc'd  Assessment: 67 y.o. male s/p Hartman's procedure (sigmoid colectomy with end colostomy on 9/23 for perforated sigmoid diverticulitis). Patient with estimated PO intake <75% of estimated needs for ~3 weeks PTA.  His diet was advanced to dysphagia but remains unable to meet needs with PO intake due to very poor appetite.  TPN originally started for short-term nutritional support, now for ileus on 09/19/13 CT.   GI: 10/3 CT showed adynamic ileus and intra-abd fluid collection.  Was  on dysphagia diet and PO intake remains poor, now NPO for perc drain placement (removed of purulent material).  kCal count done 9/29 showed PO intake providing ~500 kCal, 14 gm protein. Ostomy O/P increased to 346mL/24h - on loperamide QID. Prealbumin improved to 8.4.  On PO multivitamin  Endo: No hx DM - pt required minimal SSI, now dc'd  Lytes: hyponatremia, Mg stable but at the low end of normal despite supplementation -  Watch K+ as tacrolimus could contribute to increased K+, 4.8 today  Renal: CKD stage 3 - SCr improved to 1.13, good UOP  Pulm: stable on 96% RA - pleural effusion on CT  Cards: hx HTN.  S/p heart transplant [facility] 2003); continues on immunosuppressive therapy with tacro (level 8.6 on 10/2 - at goal; repeat level in process), azathioprine, prednisone.  ECHO shows diastolic dysfunction.  BP 140/83, HR 110, metoprolol IV scheduled - pending transfer to [name]  Hepatobil: LFTS WNL, tbili 1.9.  TG down to 136  Neuro: confusion (likely delirium) so unable to participate in goals of care meeting.   ID: Vanc D#7 + Merrem D#4 + Mycamine D#6, empiric for fever.  10/3 CT showed early  abscess.  Ertapenem 9/21 >> 10/1, also on Nystatin QID. Afebrile, WBC 11.1, C.diff negative.  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  Hgb appears to be stabilizing, s/p transfusion 10/2, plts high at 413  Best Practices: heparin held  TPN Access: PICC placed 10/2  TPN day#: 5 (10/2 >> )  Current Nutrition:  Low sodium, dysphagia diet (minimal intake) Ensure supplements TID (received 2 yesterday) Clinimix E 5/15 at 83 ml/hr + IVFE at 10 ml/hr  Nutritional Goals:  2000-2200 kCal, 100-110 grams of protein per day per RD 10/2  Plan:  - Continue Clinimix E 5/15 at goal rate of 83 ml/hr and IVFE 20% at 10 ml/hr.  TPN provides 1894 kCal and 100gm protein per day, meeting 95% of  minimal kCal and 100% of minimal protein needs - No multivitamin or trace elements in TPN as pt taking PO multivitamin - No Mg supp today - will f/u labs to determine if daily supplementation outside of TPN is necessary - F/U AM labs - F/u transfer plans  Lysle Pearl, PharmD, BCPS Pager # [phone] 09/23/2013 9:20 AM

## 2013-09-23 NOTE — Progress Notes (Signed)
. Advanced Heart Failure Team Consult Note  Referring Physician: Dr. [name] ([facility]) Primary Cardiologist:  Dr. 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  S/p partial colectomy on 9/23 with lysis of adhesions.    On 10/5 underwent CT guided drainage of mid abdominal abscess. Drain in place. Cx pending. [facility] does not feel that he has an anastomotic leak.  Complaining of fatigue. Poor po intake. Tmax 99.6.   Remains on vanc/meropenem/micafungin. Tacrolimus level pending.   Bcx 10/1 x 2  NGTD Abscess culture 10/5: pending BCx 10/6 x 2: pending UA 10/5: negative (+hematuria)  Objective:    Vital Signs:   Temp:  [98.6 F (37 C)-99.6 F (37.6 C)] 99.6 F (37.6 C) (10/07 0500) Pulse Rate:  [108-110] 110 (10/07 0500) Resp:  [20-22] 22 (10/07 0500) BP: (137-140)/(76-83) 140/83 mmHg (10/07 0500) SpO2:  [95 %-96 %] 96 % (10/07 0500) Last BM Date: 09/22/13  Weight change: Filed Weights   09/11/13 0400 09/11/13 1506 09/18/13 1547  Weight: 182 lb 8.7 oz (82.8 kg) 185 lb 3 oz (84 kg) 179 lb 3.7 oz (81.3 kg)    Intake/Output:   Intake/Output Summary (Last 24 hours) at 09/23/13 0955 Last data filed at 09/23/13 0536  Gross per 24 hour  Intake 2009.78 ml  Output   5755 ml  Net -3745.22 ml     Physical Exam: General:  Lying in bed. No distress. Wife at bedside HEENT: normal Neck: supple. JVP 6 . Carotids 2+ bilat; no bruits. No lymphadenopathy or thryomegaly appreciated. Cor: PMI nondisplaced. Regular rate & rhythm. No rubs, gallops or murmurs. Lungs: clear Abdomen: soft, diffusely tender. Worst in RLQ. No rebound + colostomy. Good bs.  + drain in place Extremities: no cyanosis, clubbing, rash, edema Neuro: alert & confused, cranial nerves grossly intact. moves all 4 extremities w/o difficulty. Affect pleasant  Telemetry: SR  Labs: Basic Metabolic Panel:  Recent Labs Lab 09/17/13 0535 09/18/13 0612 09/19/13 0448 09/20/13 0516 09/21/13 1210 09/22/13 0500 09/23/13 0459  NA 131* 129* 130* 132* 130* 131* 130*  K 4.6 4.9 4.4 4.6 4.9 4.6 4.8  CL 102 101 99 100 99 100 99  CO2 19 19 19  18* 20 20 21   GLUCOSE 131* 130* 116* 136* 122* 131* 130*  BUN 21 15 18 21  27* 30* 28*  CREATININE 1.40* 1.55* 1.65* 1.49* 1.34 1.24 1.13  CALCIUM 7.9* 7.5* 8.2* 7.9* 8.0* 7.9* 8.2*  MG 1.4* 1.4* 1.7 1.4* 1.7 1.9 1.8  PHOS 2.8 3.4 3.7 3.2  --  3.0  --     Liver Function Tests:  Recent Labs Lab 09/17/13 0535 09/18/13 0612 09/19/13 0448 09/22/13 0500  AST 68* 42* 32 27  ALT 54* 41 35 26  ALKPHOS 77 73 76 76  BILITOT 1.7* 1.8* 1.9* 1.9*  PROT 5.5* 5.1* 5.8* 5.5*  ALBUMIN 1.8* 1.6* 1.9* 1.7*   No results found for this basename: LIPASE, AMYLASE,  in the last 168 hours  Recent Labs Lab 09/17/13 1829  AMMONIA 29    CBC:  Recent Labs Lab 09/19/13 0448 09/20/13 0516 09/21/13 0500 09/22/13 0500 09/23/13 0459  WBC 9.6 9.3 9.4 9.5 11.1*  NEUTROABS 8.3* 8.0* 7.9* 8.4*  9.7*  HGB 9.8* 9.1* 9.2* 9.1* 8.9*  HCT 28.4* 26.9* 26.9* 27.0* 26.2*  MCV 94.0 95.4 95.1 94.7 95.3  PLT 388 398 426* 426* 413*    Cardiac Enzymes: No results found for this basename: CKTOTAL, CKMB, CKMBINDEX, TROPONINI,  in the last 168 hours  BNP: BNP (last 3 results) No results found for this basename: PROBNP,  in the last 8760 hours  CBG:  Recent Labs Lab 09/20/13 0609 09/20/13 1214 09/20/13 1824 09/20/13 2348 09/21/13 0623  GLUCAP 135* 149* 148* 130* 122*    Coagulation Studies: No results found for this basename: LABPROT, INR,  in the last 72 hours Tacrolimus level: 6   Imaging: Ct Guided Abscess Drain  09/21/2013    *RADIOLOGY REPORT*  Indication: Post colectomy with Hartmann's pouch formation and temporary loop ileostomy, now with an intra abdominal abscess.  CT GUIDED ABDOMINAL DRAINAGE CATHETER PLACEMENT  Comparison: CT abdomen pelvis - 09/19/2013; 09/07/2013  Medications: Fentanyl 50 mcg IV; Versed 2 mg IV  Total Moderate Sedation time: 20 minutes  Contrast: None  Complications: None immediate  Technique / Findings:  Informed written consent was obtained from the patient after a discussion of the risks, benefits and alternatives to treatment. The patient was placed supine on the CT gantry and a pre procedural CT was performed re-demonstrating the known abscess/fluid collection within the midline of the abdomen measuring at least 5.6 x 3.1 cm (image one, series 2). The procedure was planned.   A timeout was performed prior to the initiation of the procedure.  The skin between the left lower quadrant ileostomy and midline open abdominal wound was prepped and draped in the usual sterile fashion.   The overlying soft tissues were anesthetized with 1% lidocaine with epinephrine.  Appropriate trajectory was planned with the use of a 22 gauge spinal needle.  An 18 gauge trocar needle was advanced into the abscess/fluid collection and a short Amplatz super stiff wire was coiled within the collection. Appropriate positioning was confirmed with a limited CT scan.  The tract was serially dilated allowing placement of a 10 [place] all- purpose drainage catheter.  Appropriate positioning was confirmed with a limited postprocedural CT scan.  Approximately 130 ml of purulent fluid was aspirated.  The tube was connected to a drainage bag and sutured in place.  A dressing was placed.  The patient tolerated the procedure well without immediate post procedural complication.  Impression:  Successful CT guided placement of a 10 French all purpose drain catheter into the midline intra abdominal abscess with aspiration of 130 mL of purulent fluid.   Samples were sent to the laboratory as requested by the ordering clinical team.   Original Report Authenticated By: Tacey Ruiz, MD     Medications:     Current Medications: . azaTHIOprine  100 mg Oral Daily  . feeding supplement  237 mL Oral TID BM  . loperamide  2 mg Oral QID  . meropenem (MERREM) IV  1 g Intravenous Q8H  . metoprolol  5 mg Intravenous Q6H  . multivitamin with minerals  1 tablet Oral Daily  . nystatin  5 mL Oral QID  . predniSONE  5 mg Oral Daily  . sodium chloride  3 mL Intravenous Q12H  . tacrolimus  2 mg Sublingual BID    Infusions: . sodium chloride 20 mL/hr (09/22/13 0243)  . sodium chloride 50 mL/hr at 09/22/13 2208  . Marland KitchenTPN (CLINIMIX-E) Adult 83 mL/hr at 09/22/13 1836   And  . fat  emulsion 250 mL (09/22/13 1837)  . Marland KitchenTPN (CLINIMIX-E) Adult     And  . fat emulsion       Assessment:   1. Acute diverticulitis with perforation s/p partial colectomy    --f/u CT with fluid collection suspicious for abscess 2. Chronic systolic HF s/p OHTx in 2003 ([name]) 3. Tonsillar cancer - currently undergoing Chemo/XRT 4.  chronic renal failure 5. Malnutrition, severe, albumin 1.6 6. Acute delirium 7. Hyponatremia  Plan/Discussion:    Continues to spike fevers despite broad spectrum coverage and CT-guided drainage of abscess.  Transfer to [facility] pending. Hopeful today when bed available. Tacrolimus level pending.  Accepting Physician at [name]: Dr. Paulino Rily. (He has notified [employer]).   Peter Clarke 09/23/2013 12:41 PM

## 2013-09-23 NOTE — Progress Notes (Signed)
Report called to Tia RN at [facility]. Bed [phone]

## 2013-09-23 NOTE — Progress Notes (Signed)
Culture now growing GNR.  Will d/c vancomycin and micafungin.  Continue with meropenem pending ID of organism.

## 2013-09-23 NOTE — Discharge Summary (Signed)
Physician Discharge Summary  Peter Clarke XBJ:[id number] DOB: [birth year] DOA: 09/07/2013  PCP: [name], MD  Admit date: 09/07/2013 Discharge date: 09/23/2013  Time spent: 35 minutes  Recommendations for Outpatient Follow-up:  1. Please follow up abscess culture results.  2. Please consult general surgery.   Discharge Diagnoses:    Diverticulitis with perforation s/p laparoscopic assisted Hartman's procedure (sigmoid colectomy with end colostomy) w/ lysis of   adhesions.    History of heart transplant/chronic immunosuppression/history of Chronic   Systolic heart failure/New grade 2 DD    Acute on  CKD (chronic kidney disease) stage 3, GFR 30-59 ml/min   Chronic diastolic heart failure   HTN (hypertension)   History of heart transplant/chronic immunosupression   Head and neck cancer   Malnutrition of moderate degree   Hypomagnesemia   Quality of life palliative care encounter   Transfer to [facility].   Diet recommendation: Dysphagia 3 diet.   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 Orlie Clarke at [facility] currently undergoing chemotherapy and radiation therapy, CKD stage 3, presents with abdominal pain. He states his pain begun yesterday evening and "just him me". He admits to some slight diarrhea, but no melena or hematochezia. He denies any N/V.  He was transferred from [facility] due to findings of abnormal thickening of sigmoid colon with perforation. He has been evaluated by Dr. Janee Clarke of surgery with plans to treat conservatively with bowel rest and IV abx initially, but with a low threshold for surgery.  He is noted to have a WBC of 13k. He is also noted to have a Cr of 2.06,  increased from 1.57 on 9/3.  Patient to be transfer today to [facility].   Hospital Course:  Patient admitted 9-21  with perforated diverticulitis diagnosed by CT scan. Patient underwent laparoscopic assisted Luz Brazen procedure(sigmoid colectomy with end colostomy) with lysis of adhesions. He was initially started on Invaz. Post operative patient spike fever at 102, antibiotics was change on 10-01 to Vancomycin and zosyn. Subsequently Zosyn was change to meropenem on 10-04 for aspergillus cover. He has been on micafungin since 10-02. Patient had a repeat CT scan 10-03 that showed: Fluid collection with small bubbles of air in the low central abdomen measuring 10.6 cm in greatest dimension. Please see full CT scan report. Patient underwent CT guide aspiration By IR on 10-05 with drainage catheter placement into mid abdomen yielding 130 cc of purulent material. Culture are pending at this time.  Patient has had poor oral intake. He has been on TNA since 10-04. Patient presents with acute on chronic renal failure, likely pre renal. Renal failure has respond to IV fluids. He is on NS 50 CC/hr and TPN.  Patient also developed delirium thought to be secondary to infection, hospital delirium. A ct head was negative.   1. Diverticulitis with perforation  -care as per Gen Surgery - now s/p laparoscopic assisted Hartman's procedure (sigmoid colectomy with end colostomy) w/ lysis of adhesions.  -Antibiotics broad to vancomycin on 10-01 due to spike in fever. Micafungin started 10-02. Meropenem Started 10-04.  -CT abdomen /pelvis 10-03; Show : Fluid collection with small bubbles of air  in the low central abdomen measuring 10.6 cm in greatest dimension.  -S/P IR  CT guided drainage of abscess 10-05.  -Please follow final culture results from abdominal abscess. Preliminary growing abundant gram negative rods.   Recommendation from surgery team:  1. Cont abx therapy  2. cont dressing changes to wound  3. Cont imodium  for high output from his colostomy  4. Encourage po intake  5. If needs transfer to [name], it's ok from our standpoint  6. Will need repeat CT scan in 4-6 days to follow up intra-abdominal abscess.   2. History of heart transplant/chronic immunosuppression/history of Chronic Systolic heart failure/New grade 2 DD  -care as per CHF Team  -ECHO this admit showed grade 2 DD.  -Cardiology has been periodically updating transplant surgeon at [facility].  -Patient's CHF stable  -Patient on Prograf, 2 mg sub BID.  -Will defer lasix to Cardiologist. Chest x ray with pleural effusion.  -Patient to be transfer to [name] to be follow by transplant team and surgical team. Patient with complex medical care and immunosuppressive therapy.    3-Delirium:May be related to infection, hospital delirium. CT head negative. Ammonia level 29.  4-fever: antibiotics broad to vancomycin and zosyn day 2. Started on Micafungin 10-02. Chest x tay atelectasis. c diff negative. Blood culture no growth to date. CT abdomen/pelvis with fluid collection concern for developing abscess.  -Appreciate Dr hatcher recommendation. Zosyn change to meropenem.   5. Head and neck cancer  -Imuran dose decreased to 100 mg Daily in August when dx'd with throat cancer.  -continue home steroid dose.  6. Hypomagnesemia  Resolved. Replete with TPN.  7. Hypophosphatemia; stable. Replete with TPN.  8. HTN (hypertension)  -BP within AHA guidelines.  9. CKD (chronic kidney disease) stage 3, GFR 30-59 ml/min  -cr decrease to 1.2. Creatine on admission at 2.0. Monitor. Continue with IV fluids.  10. Malnutrition;  -Continue with TPN.  11-Hyponatremia; continue with IV fluids. Improving.    Procedures: 2-D echocardiogram  - Left ventricle: The cavity size was normal. Wall thickness was normal. Systolic function was normal. The estimated ejection fraction was in the range of 60% to 65%. Wall motion was normal; there were no regional wall  motion abnormalities. Features are consistent with a pseudonormal left ventricular filling pattern, with concomitant abnormal relaxation and increased filling pressure (grade 2 diastolic dysfunction). - Aortic valve: There was no stenosis. - Mitral valve: Trivial regurgitation. - Left atrium: The atrium was mildly dilated. - Right ventricle: Poorly visualized. The cavity size was normal. Systolic function was normal. - Right atrium: The atrium was mildly dilated. - Pulmonary arteries: No complete TR doppler jet so unable to estimate PA systolic pressure. - Systemic veins: IVC was not visualized.  09/07/2013 perforated sigmoid diverticulitis; S/P Laparoscopic assisted Hartman's procedure (sigmoid colectomy with end colostomy), lysis of adhesions x 1 hr by Dr. Gaynelle Adu (surgeon)   Consultations: Cardiology, Dr [name] General surgery, Dr Peter Clarke.    Discharge Exam: Filed Vitals:   09/23/13 0500  BP: 140/83  Pulse: 110  Temp: 99.6 F (37.6 C)  Resp: 22    General: Alert, awake, confuse.  Cardiovascular: S 1, S 2 RRR Respiratory: CTA Abdomen: clean dressing, ostomy in place, drain in place.   Discharge Instructions      Discharge Orders   Future Orders Complete By Expires   Diet - low sodium heart healthy  As directed    Increase activity slowly  As directed  Medication List    STOP taking these medications       amLODipine 5 MG tablet  Commonly known as:  NORVASC     carvedilol 25 MG tablet  Commonly known as:  COREG      TAKE these medications       azaTHIOprine 50 MG tablet  Commonly known as:  IMURAN  Take 100 mg by mouth daily.     CENTRUM SILVER PO  Take 1 tablet by mouth daily.     feeding supplement (ENSURE COMPLETE) Liqd  Take 237 mLs by mouth 3 (three) times daily between meals.     loperamide 2 MG capsule  Commonly known as:  IMODIUM  Take 1 capsule (2 mg total) by mouth 4 (four) times daily.     metoprolol 1 MG/ML  injection  Commonly known as:  LOPRESSOR  Inject 5 mLs (5 mg total) into the vein every 6 (six) hours.     nystatin 100000 UNIT/ML suspension  Commonly known as:  MYCOSTATIN  Take 5 mLs (500,000 Units total) by mouth 4 (four) times daily.     predniSONE 5 MG tablet  Commonly known as:  DELTASONE  Take 5 mg by mouth daily.     sodium chloride 0.9 % SOLN 100 mL with meropenem 1 G SOLR 1 g  Inject 1 g into the vein every 8 (eight) hours.     sodium chloride 0.9 % SOLN 100 mL with micafungin 50 MG SOLR 100 mg  Inject 100 mg into the vein at bedtime.     sodium chloride 0.9 % SOLN 250 mL with vancomycin 10 G SOLR 1,250 mg  Inject 1,250 mg into the vein daily.     tacrolimus 1 MG capsule  Commonly known as:  PROGRAF  Take 2 capsules (2 mg total) by mouth 2 (two) times daily.       Allergies  Allergen Reactions  . Pepto-Bismol [Bismuth Subsalicylate]     rash   Follow-up Information   Call Atilano Ina, MD. (after discharged from [facility])    Specialty:  General Surgery   Contact information:   [address] [place] [address] [phone]        The results of significant diagnostics from this hospitalization (including imaging, microbiology, ancillary and laboratory) are listed below for reference.    Significant Diagnostic Studies: Ct Abdomen Pelvis Wo Contrast  09/19/2013   CLINICAL DATA:  History of diverticulitis treated with surgery 10 days ago. Patient has fever and abdominal pain.  EXAM: CT ABDOMEN AND PELVIS WITHOUT CONTRAST  TECHNIQUE: Multidetector CT imaging of the abdomen and pelvis was performed following the standard protocol without intravenous contrast.  COMPARISON:  09/07/2013  FINDINGS: Since the prior CT, abdominal surgery has been performed. There is a midline defect along the skin and subcutaneous soft tissues extending to the fascia reflecting an open incision. The fascia was closed. A colostomy has been formed in the left lower quadrant.  The sigmoid colon in its midportion has been ligated with a bowel anastomosis staple line.  There is a collection in the low mid abdomen anterior to the aortic bifurcation and iliac vessels. It contains fluid and small nondependent bubbles of air. Its wall is somewhat ill-defined. It measures 6.8 cm x 2.5 cm by 10.6 cm in size. Along its right superior margin it abuts an area of hazy and nodular soft tissue density with interspersed foci of air. This lies in the right mid abdomen and is  contiguous with similar opacity and air in the central abdomen. No other formed fluid collection is seen.  The left colon is mostly decompressed. The transverse and ascending colon are normal in caliber. There are air-fluid levels in this portion of the colon. No wall thickening. There is mild dilation of proximal small bowel without a discrete transition point. Small bowel air-fluid levels are also noted. This is likely due to a mild diffuse adynamic ileus.  There are small bilateral pleural effusions. There is dependent lower lobe atelectasis.  Multiple small low-density lesions are noted in the liver. These were present on the prior exam and were seen on exam dated 02/11/2009. They are likely cysts. The liver is otherwise unremarkable. Normal spleen. The gallbladder is distended. There are dependent gallstones. No gallbladder wall thickening or adjacent inflammation is seen. There is no bile duct dilation. Normal pancreas. No adrenal masses. There is mild bilateral renal cortical thinning. There are no renal masses or stones. No hydronephrosis. Normal ureters and bladder. The prostate is enlarged. There is a fat containing left inguinal hernia which stable.  There are degenerative changes of the visualized spine.  IMPRESSION: 1. Fluid collection with small bubbles of air in the low central abdomen measuring 10.6 cm in greatest dimension. This is consistent with an early abscess. There is abnormal extraluminal air and adjacent hazy  and nodular soft tissue attenuation in the right mid abdomen that extends across the central abdomen. This has an inflammatory appearance. The nodular areas within this may reflect prominent lymph nodes. No additional formed fluid collection is seen to suggest additional abscesses. 2. Air-fluid levels within the colon and small bowel with prominence of the proximal small bowel likely an adynamic ileus. No convincing obstruction. 3. Small bilateral pleural effusions with dependent lung base atelectasis. 4. Left lower quadrant colostomy. Hartman's pouch has been formed from the lower sigmoid colon. 5. Other chronic findings as detailed stable from the prior exams.   Electronically Signed   By: Amie Portland M.D.   On: 09/19/2013 16:04   Ct Abdomen Pelvis Wo Contrast  09/07/2013   *RADIOLOGY REPORT*  Clinical Data: Low abdomen pain.  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  CT ABDOMEN AND PELVIS WITHOUT CONTRAST  Technique:  Multidetector CT imaging of the abdomen and pelvis was performed following the standard protocol without intravenous contrast.  Comparison: February 11, 2009.  Findings: There is abnormal thickening of the sigmoid colon with perforation.  There is free air surrounding the sigmoid colon with air extending superiorly. There is no small bowel obstruction.  There are several low density lesions within the liver, most are present on the prior CT of February 06, 2009.  Evaluation is limited without contrast.  The spleen, pancreas, gallbladder, adrenal glands are normal.  There is no nephrolithiasis or hydroureternephrosis  bilaterally.  There is atherosclerosis of the abdominal aorta without aneurysmal dilatation.  Images of the pelvis demonstrate partial fluid filled bladder without abnormality.  Left inguinal herniation of mesenteric fat is identified measuring 5.6 cm.  There is mild scarring of the posterior lung bases with mild dependent atelectasis of  the right lung base. There is minimal right pleural effusion . Degenerative joint changes of the spine are noted.  IMPRESSION: Abnormal thickening of sigmoid colon with perforation. Differential diagnosis includes diverticulitis with perforation versus colon cancer with perforation.   Original Report Authenticated By: Sherian Rein, M.D.   Dg Chest 2 View  09/17/2013   *  RADIOLOGY REPORT*  Clinical Data: Fever.  Hypertension.  CHEST - 2 VIEW  Comparison: Chest radiograph 09/09/2013.  Findings: Low lung volumes.  Stable cardiac and mediastinal contours status post median sternotomy.  Redemonstrated fractured superior sternotomy wire.  Bilateral lower lung heterogeneous pulmonary opacities.  Small bilateral pleural effusions.  Regional skeleton is unremarkable.  IMPRESSION: Small bilateral pleural effusions.  Low lung volumes.  Bibasilar opacities likely represent atelectasis.  Infection not excluded.   Original Report Authenticated By: Annia Belt, M.D   Ct Head Wo Contrast  09/17/2013   CLINICAL DATA:  Confusion  EXAM: CT HEAD WITHOUT CONTRAST  TECHNIQUE: Contiguous axial images were obtained from the base of the skull through the vertex without intravenous contrast.  COMPARISON:  CT head without contrast 08/20/2013  FINDINGS: No acute cortical infarct, hemorrhage, or mass lesion is present. The ventricles are of normal size. No significant extra-axial fluid collection is present.  A fluid level is again seen within the right maxillary sinus. The remaining paranasal sinuses and the mastoid air cells are clear. The osseous skull is intact.  IMPRESSION: 1. Normal CT appearance of the brain. 2. Right maxillary sinusitis.   Electronically Signed   By: Gennette Pac   On: 09/17/2013 20:34   Ct Guided Abscess Drain  09/21/2013   *RADIOLOGY REPORT*  Indication: Post colectomy with Hartmann's pouch formation and temporary loop ileostomy, now with an intra abdominal abscess.  CT GUIDED ABDOMINAL DRAINAGE CATHETER  PLACEMENT  Comparison: CT abdomen pelvis - 09/19/2013; 09/07/2013  Medications: Fentanyl 50 mcg IV; Versed 2 mg IV  Total Moderate Sedation time: 20 minutes  Contrast: None  Complications: None immediate  Technique / Findings:  Informed written consent was obtained from the patient after a discussion of the risks, benefits and alternatives to treatment. The patient was placed supine on the CT gantry and a pre procedural CT was performed re-demonstrating the known abscess/fluid collection within the midline of the abdomen measuring at least 5.6 x 3.1 cm (image one, series 2). The procedure was planned.   A timeout was performed prior to the initiation of the procedure.  The skin between the left lower quadrant ileostomy and midline open abdominal wound was prepped and draped in the usual sterile fashion.   The overlying soft tissues were anesthetized with 1% lidocaine with epinephrine.  Appropriate trajectory was planned with the use of a 22 gauge spinal needle.  An 18 gauge trocar needle was advanced into the abscess/fluid collection and a short Amplatz super stiff wire was coiled within the collection. Appropriate positioning was confirmed with a limited CT scan.  The tract was serially dilated allowing placement of a 10 [place] all- purpose drainage catheter.  Appropriate positioning was confirmed with a limited postprocedural CT scan.  Approximately 130 ml of purulent fluid was aspirated.  The tube was connected to a drainage bag and sutured in place.  A dressing was placed.  The patient tolerated the procedure well without immediate post procedural complication.  Impression:  Successful CT guided placement of a 10 French all purpose drain catheter into the midline intra abdominal abscess with aspiration of 130 mL of purulent fluid.  Samples were sent to the laboratory as requested by the ordering clinical team.   Original Report Authenticated By: Tacey Ruiz, MD   Dg Chest [facility]  09/09/2013   CLINICAL  DATA:  Respiratory distress. History of head and neck cancer.  EXAM: PORTABLE CHEST - 1 VIEW  COMPARISON:  Single view of  the chest 08/20/2013.  FINDINGS: The patient has bilateral effusions and diffuse airspace disease. No pneumothorax is identified.  IMPRESSION: Diffuse bilateral airspace disease and pleural effusion most consistent with congestive heart failure. Coexistent pneumonia is also possible.   Electronically Signed   By: Drusilla Kanner M.D.   On: 09/09/2013 22:01    Microbiology: Recent Results (from the past 240 hour(s))  CULTURE, BLOOD (ROUTINE X 2)     Status: None   Collection Time    09/17/13  6:20 PM      Result Value Range Status   Specimen Description BLOOD LEFT HAND   Final   Special Requests BOTTLES DRAWN AEROBIC ONLY 10CC   Final   Culture  Setup Time     Final   Value: 09/18/2013 00:16     Performed at [facility]   Culture     Final   Value:        BLOOD CULTURE RECEIVED NO GROWTH TO DATE CULTURE WILL BE HELD FOR 5 DAYS BEFORE ISSUING A FINAL NEGATIVE REPORT     Performed at [facility]   Report Status PENDING   Incomplete  CULTURE, BLOOD (ROUTINE X 2)     Status: None   Collection Time    09/17/13  6:30 PM      Result Value Range Status   Specimen Description BLOOD LEFT ARM   Final   Special Requests BOTTLES DRAWN AEROBIC ONLY 10CC   Final   Culture  Setup Time     Final   Value: 09/18/2013 00:16     Performed at [facility]   Culture     Final   Value:        BLOOD CULTURE RECEIVED NO GROWTH TO DATE CULTURE WILL BE HELD FOR 5 DAYS BEFORE ISSUING A FINAL NEGATIVE REPORT     Performed at [facility]   Report Status PENDING   Incomplete  CLOSTRIDIUM DIFFICILE BY PCR     Status: None   Collection Time    09/17/13  6:59 PM      Result Value Range Status   C difficile by pcr NEGATIVE  NEGATIVE Final  CULTURE, ROUTINE-ABSCESS     Status: None   Collection Time    09/21/13 10:27 AM      Result Value Range Status    Specimen Description ABSCESS DRAINAGE ABDOMEN   Final   Special Requests     Final   Value: POST COLECOMY WITH DIVERTING ILEOSTOMY, NOW WITH INTRA ABDOMINAL   Gram Stain PENDING   Incomplete   Culture     Final   Value: ABUNDANT GRAM NEGATIVE RODS     Performed at [facility]   Report Status PENDING   Incomplete  CULTURE, BLOOD (ROUTINE X 2)     Status: None   Collection Time    09/22/13  8:55 AM      Result Value Range Status   Specimen Description BLOOD LEFT ANTECUBITAL   Final   Special Requests BOTTLES DRAWN AEROBIC AND ANAEROBIC 10CC   Final   Culture  Setup Time     Final   Value: 09/22/2013 14:34     Performed at [facility]   Culture     Final   Value:        BLOOD CULTURE RECEIVED NO GROWTH TO DATE CULTURE WILL BE HELD FOR 5 DAYS BEFORE ISSUING A FINAL NEGATIVE REPORT     Performed at [facility]   Report Status PENDING   Incomplete  CULTURE, BLOOD (ROUTINE X 2)     Status: None   Collection Time    09/22/13  9:10 AM      Result Value Range Status   Specimen Description BLOOD LEFT HAND   Final   Special Requests BOTTLES DRAWN AEROBIC AND ANAEROBIC 10CC   Final   Culture  Setup Time     Final   Value: 09/22/2013 14:34     Performed at [facility]   Culture     Final   Value:        BLOOD CULTURE RECEIVED NO GROWTH TO DATE CULTURE WILL BE HELD FOR 5 DAYS BEFORE ISSUING A FINAL NEGATIVE REPORT     Performed at [facility]   Report Status PENDING   Incomplete     Labs: Basic Metabolic Panel:  Recent Labs Lab 09/17/13 0535 09/18/13 0612 09/19/13 0448 09/20/13 0516 09/21/13 1210 09/22/13 0500 09/23/13 0459  NA 131* 129* 130* 132* 130* 131* 130*  K 4.6 4.9 4.4 4.6 4.9 4.6 4.8  CL 102 101 99 100 99 100 99  CO2 19 19 19  18* 20 20 21   GLUCOSE 131* 130* 116* 136* 122* 131* 130*  BUN 21 15 18 21  27* 30* 28*  CREATININE 1.40* 1.55* 1.65* 1.49* 1.34 1.24 1.13  CALCIUM 7.9* 7.5* 8.2* 7.9* 8.0* 7.9* 8.2*  MG 1.4* 1.4* 1.7  1.4* 1.7 1.9 1.8  PHOS 2.8 3.4 3.7 3.2  --  3.0  --    Liver Function Tests:  Recent Labs Lab 09/17/13 0535 09/18/13 0612 09/19/13 0448 09/22/13 0500  AST 68* 42* 32 27  ALT 54* 41 35 26  ALKPHOS 77 73 76 76  BILITOT 1.7* 1.8* 1.9* 1.9*  PROT 5.5* 5.1* 5.8* 5.5*  ALBUMIN 1.8* 1.6* 1.9* 1.7*   No results found for this basename: LIPASE, AMYLASE,  in the last 168 hours  Recent Labs Lab 09/17/13 1829  AMMONIA 29   CBC:  Recent Labs Lab 09/19/13 0448 09/20/13 0516 09/21/13 0500 09/22/13 0500 09/23/13 0459  WBC 9.6 9.3 9.4 9.5 11.1*  NEUTROABS 8.3* 8.0* 7.9* 8.4* 9.7*  HGB 9.8* 9.1* 9.2* 9.1* 8.9*  HCT 28.4* 26.9* 26.9* 27.0* 26.2*  MCV 94.0 95.4 95.1 94.7 95.3  PLT 388 398 426* 426* 413*   Cardiac Enzymes: No results found for this basename: CKTOTAL, CKMB, CKMBINDEX, TROPONINI,  in the last 168 hours BNP: BNP (last 3 results) No results found for this basename: PROBNP,  in the last 8760 hours CBG:  Recent Labs Lab 09/20/13 0609 09/20/13 1214 09/20/13 1824 09/20/13 2348 09/21/13 0623  GLUCAP 135* 149* 148* 130* 122*       Signed:  REGALADO,BELKYS  [facility] 09/23/2013, 10:17 AM

## 2013-09-23 NOTE — Progress Notes (Signed)
Chaplain made a follow up visit to see the patient. Chaplain offered ministry of presence, emotional support and prayer to the patient's wife. Chaplain advised by patient's wife that patient being discharged today to [facility]   09/23/13 1400  Clinical Encounter Type  Visited With Family  Visit Type Follow-up;Spiritual support;Social support  Spiritual Encounters  Spiritual Needs Emotional  Stress Factors  Family Stress Factors Major life changes

## 2013-09-24 LAB — CULTURE, BLOOD (ROUTINE X 2)
Culture: NO GROWTH
Culture: NO GROWTH

## 2013-09-24 LAB — CULTURE, ROUTINE-ABSCESS

## 2013-09-25 LAB — TACROLIMUS LEVEL: Tacrolimus (FK506) - LabCorp: 8.1 ng/mL

## 2013-09-28 LAB — CULTURE, BLOOD (ROUTINE X 2): Culture: NO GROWTH

## 2013-10-01 IMAGING — CR DG CHEST 1V
1 series · 1 of 1 positions shown · non-contrast
Comparison: 12/28/2010

CLINICAL DATA: Headache, hypertension.

EXAM:
CHEST - 1 VIEW

[view not recorded]
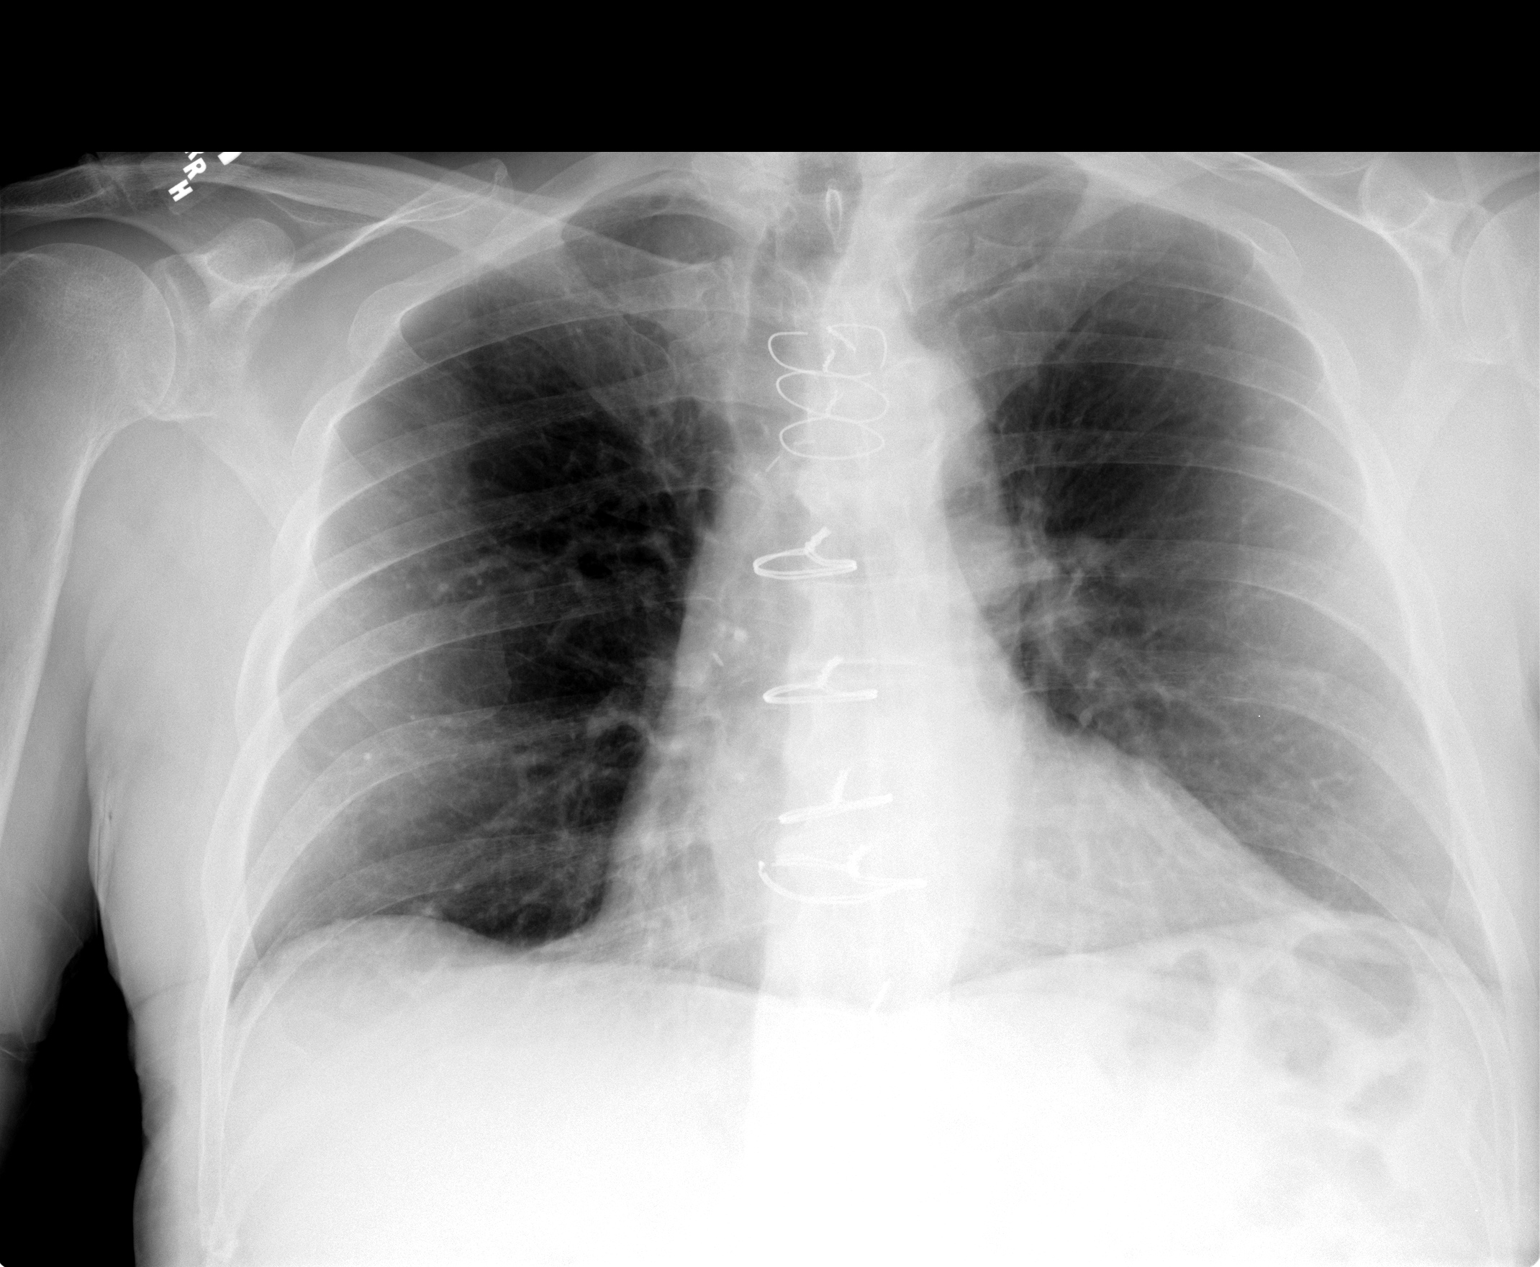

[1 of 1 positions shown; findings below may reference images not displayed]

FINDINGS: Prior CABG. Mild hyperinflation of the lungs without confluent
opacity, effusion or acute bony abnormality. Heart is normal size.
IMPRESSION: Mild hyperinflation. No acute findings.

## 2013-10-09 ENCOUNTER — Emergency Department (HOSPITAL_COMMUNITY): Payer: Medicare Other

## 2013-10-09 ENCOUNTER — Inpatient Hospital Stay (HOSPITAL_COMMUNITY)
Admission: EM | Admit: 2013-10-09 | Discharge: 2013-10-11 | DRG: 682 | Disposition: A | Discharge status: Home Health | Payer: Medicare Other | Attending: Internal Medicine | Admitting: Internal Medicine

## 2013-10-09 ENCOUNTER — Encounter (HOSPITAL_COMMUNITY): Payer: Self-pay | Admitting: Emergency Medicine

## 2013-10-09 DIAGNOSIS — E43 Unspecified severe protein-calorie malnutrition: Secondary | ICD-10-CM | POA: Insufficient documentation

## 2013-10-09 DIAGNOSIS — C760 Malignant neoplasm of head, face and neck: Secondary | ICD-10-CM | POA: Diagnosis present

## 2013-10-09 DIAGNOSIS — Z9049 Acquired absence of other specified parts of digestive tract: Secondary | ICD-10-CM

## 2013-10-09 DIAGNOSIS — N179 Acute kidney failure, unspecified: Secondary | ICD-10-CM

## 2013-10-09 DIAGNOSIS — E875 Hyperkalemia: Secondary | ICD-10-CM | POA: Diagnosis present

## 2013-10-09 DIAGNOSIS — Z941 Heart transplant status: Secondary | ICD-10-CM

## 2013-10-09 DIAGNOSIS — E860 Dehydration: Secondary | ICD-10-CM

## 2013-10-09 DIAGNOSIS — F17200 Nicotine dependence, unspecified, uncomplicated: Secondary | ICD-10-CM | POA: Diagnosis present

## 2013-10-09 DIAGNOSIS — N183 Chronic kidney disease, stage 3 unspecified: Secondary | ICD-10-CM | POA: Diagnosis present

## 2013-10-09 DIAGNOSIS — IMO0002 Reserved for concepts with insufficient information to code with codable children: Secondary | ICD-10-CM

## 2013-10-09 DIAGNOSIS — K5780 Diverticulitis of intestine, part unspecified, with perforation and abscess without bleeding: Secondary | ICD-10-CM | POA: Diagnosis present

## 2013-10-09 DIAGNOSIS — I129 Hypertensive chronic kidney disease with stage 1 through stage 4 chronic kidney disease, or unspecified chronic kidney disease: Secondary | ICD-10-CM | POA: Diagnosis present

## 2013-10-09 DIAGNOSIS — I5032 Chronic diastolic (congestive) heart failure: Secondary | ICD-10-CM

## 2013-10-09 DIAGNOSIS — Z933 Colostomy status: Secondary | ICD-10-CM

## 2013-10-09 DIAGNOSIS — Z66 Do not resuscitate: Secondary | ICD-10-CM | POA: Diagnosis present

## 2013-10-09 DIAGNOSIS — I10 Essential (primary) hypertension: Secondary | ICD-10-CM | POA: Diagnosis present

## 2013-10-09 DIAGNOSIS — N19 Unspecified kidney failure: Secondary | ICD-10-CM

## 2013-10-09 DIAGNOSIS — Z9221 Personal history of antineoplastic chemotherapy: Secondary | ICD-10-CM

## 2013-10-09 DIAGNOSIS — E871 Hypo-osmolality and hyponatremia: Secondary | ICD-10-CM | POA: Diagnosis present

## 2013-10-09 DIAGNOSIS — K5732 Diverticulitis of large intestine without perforation or abscess without bleeding: Secondary | ICD-10-CM

## 2013-10-09 DIAGNOSIS — Z79899 Other long term (current) drug therapy: Secondary | ICD-10-CM

## 2013-10-09 DIAGNOSIS — I5022 Chronic systolic (congestive) heart failure: Secondary | ICD-10-CM | POA: Diagnosis present

## 2013-10-09 LAB — CBC WITH DIFFERENTIAL/PLATELET
Eosinophils Relative: 1 % (ref 0–5)
HCT: 27.8 % — ABNORMAL LOW (ref 39.0–52.0)
Hemoglobin: 9.2 g/dL — ABNORMAL LOW (ref 13.0–17.0)
Lymphocytes Relative: 13 % (ref 12–46)
Lymphs Abs: 1.1 10*3/uL (ref 0.7–4.0)
MCV: 95.2 fL (ref 78.0–100.0)
Monocytes Absolute: 1.2 10*3/uL — ABNORMAL HIGH (ref 0.1–1.0)
Monocytes Relative: 15 % — ABNORMAL HIGH (ref 3–12)
Platelets: 499 10*3/uL — ABNORMAL HIGH (ref 150–400)
RBC: 2.92 MIL/uL — ABNORMAL LOW (ref 4.22–5.81)
WBC: 8.1 10*3/uL (ref 4.0–10.5)

## 2013-10-09 LAB — HEPATIC FUNCTION PANEL
AST: 14 U/L (ref 0–37)
Albumin: 2.7 g/dL — ABNORMAL LOW (ref 3.5–5.2)
Alkaline Phosphatase: 57 U/L (ref 39–117)
Bilirubin, Direct: 0.3 mg/dL (ref 0.0–0.3)
Total Bilirubin: 0.5 mg/dL (ref 0.3–1.2)
Total Protein: 6.7 g/dL (ref 6.0–8.3)

## 2013-10-09 LAB — BASIC METABOLIC PANEL
BUN: 39 mg/dL — ABNORMAL HIGH (ref 6–23)
CO2: 24 mEq/L (ref 19–32)
Calcium: 9.4 mg/dL (ref 8.4–10.5)
Creatinine, Ser: 2.12 mg/dL — ABNORMAL HIGH (ref 0.50–1.35)
GFR calc Af Amer: 35 mL/min — ABNORMAL LOW (ref >90)
Glucose, Bld: 118 mg/dL — ABNORMAL HIGH (ref 70–99)
Sodium: 128 mEq/L — ABNORMAL LOW (ref 135–145)

## 2013-10-09 LAB — URINALYSIS, ROUTINE W REFLEX MICROSCOPIC
Bilirubin Urine: NEGATIVE
Glucose, UA: NEGATIVE mg/dL
Hgb urine dipstick: NEGATIVE
Ketones, ur: NEGATIVE mg/dL
Nitrite: NEGATIVE
Protein, ur: NEGATIVE mg/dL
Specific Gravity, Urine: 1.025 (ref 1.005–1.030)

## 2013-10-09 LAB — LIPASE, BLOOD: Lipase: 22 U/L (ref 11–59)

## 2013-10-09 MED ORDER — OXYCODONE HCL 5 MG PO TABS
10.0000 mg | ORAL_TABLET | Freq: Four times a day (QID) | ORAL | Status: DC | PRN
  Administered 2013-10-09 – 2013-10-10 (×3): 10 mg via ORAL
  Filled 2013-10-09 (×4): qty 2

## 2013-10-09 MED ORDER — ENOXAPARIN SODIUM 40 MG/0.4ML ~~LOC~~ SOLN
40.0000 mg | SUBCUTANEOUS | Status: DC
  Administered 2013-10-09 – 2013-10-10 (×2): 40 mg via SUBCUTANEOUS
  Filled 2013-10-09 (×2): qty 0.4

## 2013-10-09 MED ORDER — SODIUM CHLORIDE 0.9 % IV BOLUS (SEPSIS)
500.0000 mL | Freq: Once | INTRAVENOUS | Status: AC
  Administered 2013-10-09: 500 mL via INTRAVENOUS

## 2013-10-09 MED ORDER — KETOCONAZOLE 2 % EX CREA
TOPICAL_CREAM | CUTANEOUS | Status: AC
  Filled 2013-10-09: qty 15

## 2013-10-09 MED ORDER — TACROLIMUS 1 MG PO CAPS
ORAL_CAPSULE | ORAL | Status: AC
  Filled 2013-10-09: qty 3

## 2013-10-09 MED ORDER — PROMETHAZINE HCL 25 MG/ML IJ SOLN
12.5000 mg | Freq: Four times a day (QID) | INTRAMUSCULAR | Status: DC | PRN
  Administered 2013-10-10: 12.5 mg via INTRAVENOUS
  Filled 2013-10-09: qty 1

## 2013-10-09 MED ORDER — PROMETHAZINE HCL 25 MG/ML IJ SOLN
12.5000 mg | Freq: Once | INTRAMUSCULAR | Status: AC
  Administered 2013-10-09: 12.5 mg via INTRAVENOUS
  Filled 2013-10-09: qty 1

## 2013-10-09 MED ORDER — ENSURE COMPLETE PO LIQD
237.0000 mL | Freq: Three times a day (TID) | ORAL | Status: DC
  Administered 2013-10-09: 237 mL via ORAL

## 2013-10-09 MED ORDER — FENTANYL CITRATE 0.05 MG/ML IJ SOLN
INTRAMUSCULAR | Status: AC
  Administered 2013-10-09: 50 ug via INTRAVENOUS
  Filled 2013-10-09: qty 2

## 2013-10-09 MED ORDER — SODIUM CHLORIDE 0.9 % IV SOLN
INTRAVENOUS | Status: AC
  Administered 2013-10-09: 1000 mL via INTRAVENOUS

## 2013-10-09 MED ORDER — DEXTROSE 5 % IV SOLN
INTRAVENOUS | Status: AC
  Filled 2013-10-09: qty 10

## 2013-10-09 MED ORDER — MORPHINE SULFATE 2 MG/ML IJ SOLN
2.0000 mg | INTRAMUSCULAR | Status: DC | PRN
  Administered 2013-10-09: 2 mg via INTRAVENOUS
  Filled 2013-10-09 (×2): qty 1

## 2013-10-09 MED ORDER — SODIUM CHLORIDE 0.9 % IV SOLN
INTRAVENOUS | Status: DC
  Administered 2013-10-09: 500 mL via INTRAVENOUS
  Administered 2013-10-10 – 2013-10-11 (×2): via INTRAVENOUS

## 2013-10-09 MED ORDER — FENTANYL CITRATE 0.05 MG/ML IJ SOLN
50.0000 ug | Freq: Once | INTRAMUSCULAR | Status: AC
  Administered 2013-10-09: 50 ug via INTRAVENOUS

## 2013-10-09 MED ORDER — NYSTATIN 100000 UNIT/ML MT SUSP
5.0000 mL | Freq: Four times a day (QID) | OROMUCOSAL | Status: DC
  Administered 2013-10-09 – 2013-10-11 (×6): 500000 [IU] via ORAL
  Filled 2013-10-09 (×6): qty 5

## 2013-10-09 MED ORDER — METRONIDAZOLE 500 MG PO TABS
500.0000 mg | ORAL_TABLET | Freq: Three times a day (TID) | ORAL | Status: DC
  Administered 2013-10-09 – 2013-10-11 (×5): 500 mg via ORAL
  Filled 2013-10-09 (×5): qty 1

## 2013-10-09 MED ORDER — MORPHINE SULFATE 2 MG/ML IJ SOLN
2.0000 mg | Freq: Once | INTRAMUSCULAR | Status: AC
  Administered 2013-10-09: 2 mg via INTRAVENOUS

## 2013-10-09 MED ORDER — DRONABINOL 2.5 MG PO CAPS
2.5000 mg | ORAL_CAPSULE | Freq: Two times a day (BID) | ORAL | Status: DC
  Administered 2013-10-10 – 2013-10-11 (×3): 2.5 mg via ORAL
  Filled 2013-10-09 (×3): qty 1

## 2013-10-09 MED ORDER — KETOCONAZOLE 2 % EX CREA
1.0000 "application " | TOPICAL_CREAM | Freq: Two times a day (BID) | CUTANEOUS | Status: DC
  Administered 2013-10-10 – 2013-10-11 (×3): 1 via TOPICAL
  Filled 2013-10-09: qty 15

## 2013-10-09 MED ORDER — ONDANSETRON HCL 4 MG/2ML IJ SOLN
INTRAMUSCULAR | Status: AC
  Administered 2013-10-09: 4 mg via INTRAVENOUS
  Filled 2013-10-09: qty 2

## 2013-10-09 MED ORDER — FAMOTIDINE 20 MG PO TABS
20.0000 mg | ORAL_TABLET | Freq: Two times a day (BID) | ORAL | Status: DC
  Administered 2013-10-09 – 2013-10-11 (×4): 20 mg via ORAL
  Filled 2013-10-09 (×4): qty 1

## 2013-10-09 MED ORDER — CARVEDILOL 12.5 MG PO TABS
25.0000 mg | ORAL_TABLET | Freq: Two times a day (BID) | ORAL | Status: DC
  Administered 2013-10-09 – 2013-10-11 (×4): 25 mg via ORAL
  Filled 2013-10-09 (×4): qty 2

## 2013-10-09 MED ORDER — AMLODIPINE BESYLATE 5 MG PO TABS
5.0000 mg | ORAL_TABLET | Freq: Every day | ORAL | Status: DC
  Administered 2013-10-09 – 2013-10-11 (×3): 5 mg via ORAL
  Filled 2013-10-09 (×3): qty 1

## 2013-10-09 MED ORDER — DEXTROSE 5 % IV SOLN
1.0000 g | INTRAVENOUS | Status: DC
  Administered 2013-10-10 – 2013-10-11 (×2): 1 g via INTRAVENOUS
  Filled 2013-10-09 (×4): qty 10

## 2013-10-09 MED ORDER — ONDANSETRON HCL 4 MG/2ML IJ SOLN
4.0000 mg | Freq: Once | INTRAMUSCULAR | Status: AC
  Administered 2013-10-09: 4 mg via INTRAVENOUS
  Filled 2013-10-09: qty 2

## 2013-10-09 MED ORDER — TACROLIMUS 1 MG PO CAPS
3.0000 mg | ORAL_CAPSULE | Freq: Two times a day (BID) | ORAL | Status: DC
  Administered 2013-10-10 – 2013-10-11 (×3): 3 mg via ORAL
  Filled 2013-10-09 (×8): qty 3

## 2013-10-09 MED ORDER — IOHEXOL 300 MG/ML  SOLN
50.0000 mL | Freq: Once | INTRAMUSCULAR | Status: AC | PRN
  Administered 2013-10-09: 50 mL via ORAL

## 2013-10-09 MED ORDER — PREDNISONE 10 MG PO TABS
5.0000 mg | ORAL_TABLET | Freq: Every day | ORAL | Status: DC
  Administered 2013-10-09 – 2013-10-11 (×3): 5 mg via ORAL
  Filled 2013-10-09 (×3): qty 1

## 2013-10-09 MED ORDER — LOPERAMIDE HCL 2 MG PO CAPS
2.0000 mg | ORAL_CAPSULE | Freq: Two times a day (BID) | ORAL | Status: DC
  Administered 2013-10-09 – 2013-10-11 (×4): 2 mg via ORAL
  Filled 2013-10-09 (×4): qty 1

## 2013-10-09 MED ORDER — ONDANSETRON HCL 4 MG/2ML IJ SOLN
4.0000 mg | Freq: Once | INTRAMUSCULAR | Status: AC
  Administered 2013-10-09: 4 mg via INTRAVENOUS

## 2013-10-09 MED ORDER — HYDROMORPHONE HCL PF 1 MG/ML IJ SOLN
1.0000 mg | Freq: Once | INTRAMUSCULAR | Status: AC
  Administered 2013-10-09: 1 mg via INTRAVENOUS
  Filled 2013-10-09: qty 1

## 2013-10-09 NOTE — ED Notes (Signed)
Pt states the meds are starting to work but still hurts. Nad.

## 2013-10-09 NOTE — H&P (Signed)
PCP:   [name], MD   Chief Complaint:  Dehydration  HPI: 67 year old male with complex medical problems, who was recently discharged from [facility] on Friday last week, came to the ED with concerns for dehydration. Patient was seen by his home nurse will visit him this a.m. and was concerned for dehydration based on the labs taken at the PCPs office 2 days ago. Patient has been having nausea and mild dyspnea but denies any fever. He does have chronic abdominal pain since the surgery last month. Patient hadDiverticulitis with perforation s/p laparoscopic assisted Hartman's procedure (sigmoid colectomy with end colostomy) w/ lysis of adhesions. He also has history of heart transplant/chronic immunosuppression/history of Chronic Systolic heart failure/New grade 2 DD. Patient is on chronic immunosuppressive therapy for the heart transplant. He also has been prescribed antibiotics Rocephin and Flagyl daily. Patient took IV Rocephin this morning. He denies chest pain or shortness of breath. But said that his appetite is down he is not able to eat due to  Nausea. Patient has colostomy bag in place, and has not noticed any increase in the volume of stool.   Allergies:   Allergies  Allergen Reactions  . Pepto-Bismol [Bismuth Subsalicylate]     rash      Past Medical History  Diagnosis Date  . Hypertension   . H/O heart transplant 2003  . Wears glasses   . Wears dentures     upper  . Renal insufficiency   . Cancer     tonsil  . Throat cancer     Past Surgical History  Procedure Laterality Date  . Heart transplant N/A 2003    [facility]-  . Appendectomy    . Colonoscopy    . Hernia repair  2008    rt ing  . Cardiac catheterization  2011  . Tonsillectomy Left 07/07/2013    Procedure: TONSILLECTOMY;  Surgeon: Darletta Moll, MD;  Location: [facility];  Service: ENT;  Laterality: Left;  . Direct laryngoscopy N/A 07/07/2013    Procedure: DIRECT LARYNGOSCOPY WITH BIOPSY;   Surgeon: Darletta Moll, MD;  Location: [facility];  Service: ENT;  Laterality: N/A;  . Colon resection N/A 09/09/2013    Procedure: Lap. Assisted Colectomy, lysis of adhesions, Colostomy;  Surgeon: Atilano Ina, MD;  Location: [facility] OR;  Service: General;  Laterality: N/A;    Prior to Admission medications   Medication Sig Start Date End Date Taking? Authorizing Provider  amLODipine (NORVASC) 5 MG tablet Take 5 mg by mouth daily.   Yes Historical Provider, MD  carvedilol (COREG) 25 MG tablet Take 25 mg by mouth 2 (two) times daily with a meal.   Yes Historical Provider, MD  dextrose 5 % SOLN 50 mL with cefTRIAXone 1 G SOLR 1 g Inject 1 g into the vein daily.   Yes Historical Provider, MD  dronabinol (MARINOL) 2.5 MG capsule Take 2.5 mg by mouth 2 (two) times daily before a meal.   Yes Historical Provider, MD  feeding supplement (ENSURE COMPLETE) LIQD Take 237 mLs by mouth 3 (three) times daily between meals. 09/22/13  Yes Belkys A Regalado, MD  ketoconazole (NIZORAL) 2 % cream Apply 1 application topically 2 (two) times daily.   Yes Historical Provider, MD  loperamide (IMODIUM) 2 MG capsule Take 1 capsule (2 mg total) by mouth 4 (four) times daily. 09/22/13  Yes Belkys A Regalado, MD  loperamide (IMODIUM) 2 MG capsule Take 2 mg by mouth 2 (two) times daily.  Yes Historical Provider, MD  metroNIDAZOLE (FLAGYL) 500 MG tablet Take 500 mg by mouth 3 (three) times daily.   Yes Historical Provider, MD  Multiple Vitamins-Minerals (CENTRUM SILVER PO) Take 1 tablet by mouth daily.    Yes Historical Provider, MD  nystatin (MYCOSTATIN) 100000 UNIT/ML suspension Take 5 mLs (500,000 Units total) by mouth 4 (four) times daily. 09/22/13  Yes Belkys A Regalado, MD  ondansetron (ZOFRAN) 4 MG tablet Take 4 mg by mouth every 8 (eight) hours as needed for nausea.   Yes Historical Provider, MD  Oxycodone HCl 10 MG TABS Take 10 mg by mouth every 6 (six) hours as needed (pain).   Yes Historical Provider, MD   predniSONE (DELTASONE) 5 MG tablet Take 5 mg by mouth daily.  05/13/13  Yes Historical Provider, MD  ranitidine (ZANTAC) 150 MG tablet Take 150 mg by mouth 2 (two) times daily.   Yes Historical Provider, MD  tacrolimus (PROGRAF) 1 MG capsule Take 3 mg by mouth 2 (two) times daily.   Yes Historical Provider, MD    Social History:  reports that he has been smoking Pipe.  He has never used smokeless tobacco. He reports that he does not drink alcohol or use illicit drugs.  History reviewed. No pertinent family history.   All the positives are listed in BOLD  Review of Systems:  HEENT: Headache, blurred vision, runny nose, sore throat Neck: Hypothyroidism, hyperthyroidism,,lymphadenopathy Chest : Shortness of breath, history of COPD, Asthma Heart : Chest pain, history of coronary arterey disease GI:  Nausea, vomiting, diarrhea, constipation, GERD, anorexia GU: Dysuria, urgency, frequency of urination, hematuria Neuro: Stroke, seizures, syncope Psych: Depression, anxiety, hallucinations   Physical Exam: Blood pressure 125/83, pulse 98, temperature 98.7 F (37.1 C), temperature source Oral, resp. rate 18, height 6' (1.829 m), weight 70.761 kg (156 lb), SpO2 98.00%. Constitutional:   Patient is a well-developed and well-nourished male* in no acute distress and cooperative with exam. Head: Normocephalic and atraumatic Mouth: Mucus membranes moist Eyes: PERRL, EOMI, conjunctivae normal Neck: Supple, No Thyromegaly Cardiovascular: RRR, S1 normal, S2 normal Pulmonary/Chest: CTAB, no wheezes, rales, or rhonchi Abdominal: Soft. Mild generalized tenderness on palpation, non-distended, bowel sounds are normal, no masses, organomegaly, or guarding present. Colostomy bag noticed in the left lower quadrant , 2 drains noted one at the left side and other at the back  Neurological: A&O x3, Strenght is normal and symmetric bilaterally, cranial nerve II-XII are grossly intact, no focal motor deficit,  sensory intact to light touch bilaterally.  Extremities : No Cyanosis, Clubbing or Edema   Labs on Admission:  Results for orders placed during the hospital encounter of 10/09/13 (from the past 48 hour(s))  CBC WITH DIFFERENTIAL     Status: Abnormal   Collection Time    10/09/13 12:56 PM      Result Value Range   WBC 8.1  4.0 - 10.5 K/uL   RBC 2.92 (*) 4.22 - 5.81 MIL/uL   Hemoglobin 9.2 (*) 13.0 - 17.0 g/dL   HCT 52.8 (*) 41.3 - 24.4 %   MCV 95.2  78.0 - 100.0 fL   MCH 31.5  26.0 - 34.0 pg   MCHC 33.1  30.0 - 36.0 g/dL   RDW 01.0  27.2 - 53.6 %   Platelets 499 (*) 150 - 400 K/uL   Neutrophils Relative % 72  43 - 77 %   Neutro Abs 5.8  1.7 - 7.7 K/uL   Lymphocytes Relative 13  12 - 46 %  Lymphs Abs 1.1  0.7 - 4.0 K/uL   Monocytes Relative 15 (*) 3 - 12 %   Monocytes Absolute 1.2 (*) 0.1 - 1.0 K/uL   Eosinophils Relative 1  0 - 5 %   Eosinophils Absolute 0.0  0.0 - 0.7 K/uL   Basophils Relative 0  0 - 1 %   Basophils Absolute 0.0  0.0 - 0.1 K/uL  BASIC METABOLIC PANEL     Status: Abnormal   Collection Time    10/09/13 12:56 PM      Result Value Range   Sodium 128 (*) 135 - 145 mEq/L   Potassium 4.7  3.5 - 5.1 mEq/L   Chloride 93 (*) 96 - 112 mEq/L   CO2 24  19 - 32 mEq/L   Glucose, Bld 118 (*) 70 - 99 mg/dL   BUN 39 (*) 6 - 23 mg/dL   Creatinine, Ser 5.78 (*) 0.50 - 1.35 mg/dL   Calcium 9.4  8.4 - 46.9 mg/dL   GFR calc non Af Amer 31 (*) >90 mL/min   GFR calc Af Amer 35 (*) >90 mL/min   Comment: (NOTE)     The eGFR has been calculated using the CKD EPI equation.     This calculation has not been validated in all clinical situations.     eGFR's persistently <90 mL/min signify possible Chronic Kidney     Disease.  PRO B NATRIURETIC PEPTIDE     Status: Abnormal   Collection Time    10/09/13 12:56 PM      Result Value Range   Pro B Natriuretic peptide (BNP) 827.5 (*) 0 - 125 pg/mL  HEPATIC FUNCTION PANEL     Status: Abnormal   Collection Time    10/09/13 12:56 PM       Result Value Range   Total Protein 6.7  6.0 - 8.3 g/dL   Albumin 2.7 (*) 3.5 - 5.2 g/dL   AST 14  0 - 37 U/L   ALT 10  0 - 53 U/L   Alkaline Phosphatase 57  39 - 117 U/L   Total Bilirubin 0.5  0.3 - 1.2 mg/dL   Bilirubin, Direct 0.3  0.0 - 0.3 mg/dL   Indirect Bilirubin 0.2 (*) 0.3 - 0.9 mg/dL  LIPASE, BLOOD     Status: None   Collection Time    10/09/13 12:56 PM      Result Value Range   Lipase 22  11 - 59 U/L  URINALYSIS, ROUTINE W REFLEX MICROSCOPIC     Status: None   Collection Time    10/09/13  7:02 PM      Result Value Range   Color, Urine YELLOW  YELLOW   APPearance CLEAR  CLEAR   Specific Gravity, Urine 1.025  1.005 - 1.030   pH 5.5  5.0 - 8.0   Glucose, UA NEGATIVE  NEGATIVE mg/dL   Hgb urine dipstick NEGATIVE  NEGATIVE   Bilirubin Urine NEGATIVE  NEGATIVE   Ketones, ur NEGATIVE  NEGATIVE mg/dL   Protein, ur NEGATIVE  NEGATIVE mg/dL   Urobilinogen, UA 0.2  0.0 - 1.0 mg/dL   Nitrite NEGATIVE  NEGATIVE   Leukocytes, UA NEGATIVE  NEGATIVE   Comment: MICROSCOPIC NOT DONE ON URINES WITH NEGATIVE PROTEIN, BLOOD, LEUKOCYTES, NITRITE, OR GLUCOSE <1000 mg/dL.    Radiological Exams on Admission: Ct Abdomen Pelvis Wo Contrast  10/09/2013   CLINICAL DATA:  Wound infection. Abdomen pain, status post surgery.  EXAM: CT ABDOMEN AND PELVIS WITHOUT CONTRAST  TECHNIQUE: Multidetector CT imaging of the abdomen and pelvis was performed following the standard protocol without intravenous contrast.  COMPARISON:  September 21, 2013, September 19, 2013  FINDINGS: There 2 pigtail drainage tubes in the pelvis and lower abdomen. The previously noted collections in the central lower abdomen and pelvis is almost completely resolved. There is mild residual inflammation and stranding in the mesenteries of the lower abdomen and pelvis.  There are multiple low-density lesions within the liver. The spleen, pancreas adrenal glands are normal. There is probable sludge/stone within the gallbladder. The  gallbladder is distended without pericholecystic inflammation. There is mild bilateral chronic perinephric stranding. No hydronephrosis is noted bilaterally. There is evidence of prior colon surgery. There is no evidence of bowel obstruction. There is atherosclerosis of the abdominal aorta without aneurysmal dilatation.  Fluid-filled bladder is normal. There is left inguinal herniation of mesenteric fat measuring 4.1 cm. There is mild dependent atelectasis of the posterior lung bases. Degenerative joint changes of the bones are noted.  IMPRESSION: There 2 pigtail drainage tubes in the pelvis and lower abdomen. The previously noted collections in the central lower abdomen and pelvis is almost completely resolved. There is mild residual inflammation and stranding in the mesenteries of the lower abdomen and pelvis.   Electronically Signed   By: Sherian Rein M.D.   On: 10/09/2013 17:13   Dg Chest 1 View  10/09/2013   CLINICAL DATA:  Nausea, wound infection  EXAM: CHEST - 1 VIEW  COMPARISON:  None.  FINDINGS: The cardiac shadow is within normal limits. A right-sided PICC line is seen with the tip at the cavoatrial junction. The lungs are well-aerated without focal infiltrate.  IMPRESSION: No acute abnormality noted.   Electronically Signed   By: Alcide Clever M.D.   On: 10/09/2013 16:36    Assessment/Plan Active Problems:   History of heart transplant/chronic immunosupression   HTN (hypertension)   Diverticulitis with perforation   CKD (chronic kidney disease) stage 3, GFR 30-59 ml/min AKI   67 year old male with complex medical problems, the hospital with good by mouth intake, anorexia and nausea. Lab work shows dehydration with creatinine of 2.1 to BUN 29. His last creatinine was 1.13 and BUN was 28 on 09/23/2013. Patient will be started on IV hydration with normal saline at 100 mL per hour 88 and followed the patient's BMP in a.m. regarding his nausea he'll be started on IV Phenergan as Zofran is not  helping the patient. Also patient has not been able to hold oxycodone down so we started him on IV morphine when necessary for pain. Patient has chronic hyponatremia likely due to  GI losses, will obtain serum osmolarity. Patient has been on antibiotics at home included Rocephin and Flagyl. We'll continue antibiotics in the hospital. Also continue to patient's immunosuppressive medications including Prograf and prednisone. Patient has been prescribed Marinol for appetite stimulation, will continue on that medication. Also given Imodium when necessary for diarrhea.  Code status: DO NOT RESUSCITATE  Family discussion: discussed the patient's wife at bedside   Time Spent on Admission: *70 min  Brion Hedges S [facility] Pager: [phone] 10/09/2013, 7:50 PM  If 7PM-7AM, please contact night-coverage  [email or web address]  Password [email or web address]

## 2013-10-09 NOTE — ED Notes (Signed)
Patient sent here from Dr Cathlyn Parsons office for infections. Patient had colostomy bag placed on 09/09/13. Per office patient has reduced urine output. Patient reports last voiding this morning. Per wife patient has x2 abscessed areas on back.

## 2013-10-09 NOTE — ED Provider Notes (Addendum)
CSN: [id number]     Arrival date & time 10/09/13  1151 History  This chart was scribed for Shelda Jakes, MD by Quintella Reichert, ED scribe.  This patient was seen in room APA03/APA03 and the patient's care was started at 2:29 PM.  Chief Complaint  Patient presents with  . Dehydration    Patient is a 67 y.o. male presenting with abdominal pain. The history is provided by the patient. No language interpreter was used.  Abdominal Pain Pain location:  Generalized Pain radiates to:  Chest and back (and arms) Timing:  Constant Progression:  Worsening Chronicity:  New Associated symptoms: dysuria and nausea   Associated symptoms: no chills, no cough, no fever, no shortness of breath and no sore throat   Risk factors: multiple surgeries and recent hospitalization     HPI Comments: Peter Clarke is a 67 y.o. male who presents to the Emergency Department with concern of dehydration.  Pt reports that his home nurse was visiting this morning and was concerned for dehydration based on labs taken at his PCP's office 2 days ago.  He also complains of constant generalized abdominal pain that began last night at a severity of 8/10 at its worst.  Pain radiates upward into his chest, arms and back.  He also complains of nausea and mild dysuria.  He denies fevers, chills, cold symptoms, visual changes, SOB, leg swelling, rash or headaches.  Pt received a colon resection for perforated diverticulitis on 09/09/13 and was sent home 6 days ago.   He is moving his bowels regularly in his colostomy bag.  PCP is Dr. Gerda Diss   Past Medical History  Diagnosis Date  . Hypertension   . H/O heart transplant 2003  . Wears glasses   . Wears dentures     upper  . Renal insufficiency   . Cancer     tonsil  . Throat cancer     Past Surgical History  Procedure Laterality Date  . Heart transplant N/A 2003    [facility]  . Appendectomy    . Colonoscopy    . Hernia repair  2008    rt ing  . Cardiac  catheterization  2011  . Tonsillectomy Left 07/07/2013    Procedure: TONSILLECTOMY;  Surgeon: Darletta Moll, MD;  Location: [facility];  Service: ENT;  Laterality: Left;  . Direct laryngoscopy N/A 07/07/2013    Procedure: DIRECT LARYNGOSCOPY WITH BIOPSY;  Surgeon: Darletta Moll, MD;  Location: [facility];  Service: ENT;  Laterality: N/A;  . Colon resection N/A 09/09/2013    Procedure: Lap. Assisted Colectomy, lysis of adhesions, Colostomy;  Surgeon: Atilano Ina, MD;  Location: [facility] OR;  Service: General;  Laterality: N/A;    History reviewed. No pertinent family history.   History  Substance Use Topics  . 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 Smokeless tobacco: Never Used  . Alcohol Use: No     Review of Systems  Constitutional: Negative for fever and chills.  HENT: Negative for congestion, rhinorrhea and sore throat.   Eyes: Negative for visual disturbance.  Respiratory: Negative for cough and shortness of breath.   Cardiovascular: Negative for leg swelling.  Gastrointestinal: Positive for nausea and abdominal pain.  Genitourinary: Positive for dysuria.  Skin: Negative for rash.  Neurological: Negative for headaches.  Hematological: Does not bruise/bleed easily.  Psychiatric/Behavioral: Negative for confusion.  Allergies  Pepto-bismol  Home Medications   Current Outpatient Rx  Name  Route  Sig  Dispense  Refill  . amLODipine (NORVASC) 5 MG tablet   Oral   Take 5 mg by mouth daily.         . carvedilol (COREG) 25 MG tablet   Oral   Take 25 mg by mouth 2 (two) times daily with a meal.         . dextrose 5 % SOLN 50 mL with cefTRIAXone 1 G SOLR 1 g   Intravenous   Inject 1 g into the vein daily.         Marland Kitchen dronabinol (MARINOL) 2.5 MG capsule   Oral   Take 2.5 mg by mouth 2 (two) times daily before a meal.         . feeding supplement (ENSURE COMPLETE) LIQD   Oral   Take 237 mLs  by mouth 3 (three) times daily between meals.   30 Bottle   0   . ketoconazole (NIZORAL) 2 % cream   Topical   Apply 1 application topically 2 (two) times daily.         Marland Kitchen loperamide (IMODIUM) 2 MG capsule   Oral   Take 1 capsule (2 mg total) by mouth 4 (four) times daily.   30 capsule   0   . loperamide (IMODIUM) 2 MG capsule   Oral   Take 2 mg by mouth 2 (two) times daily.         . metroNIDAZOLE (FLAGYL) 500 MG tablet   Oral   Take 500 mg by mouth 3 (three) times daily.         . Multiple Vitamins-Minerals (CENTRUM SILVER PO)   Oral   Take 1 tablet by mouth daily.          Marland Kitchen nystatin (MYCOSTATIN) 100000 UNIT/ML suspension   Oral   Take 5 mLs (500,000 Units total) by mouth 4 (four) times daily.   60 mL   0   . ondansetron (ZOFRAN) 4 MG tablet   Oral   Take 4 mg by mouth every 8 (eight) hours as needed for nausea.         . Oxycodone HCl 10 MG TABS   Oral   Take 10 mg by mouth every 6 (six) hours as needed (pain).         . predniSONE (DELTASONE) 5 MG tablet   Oral   Take 5 mg by mouth daily.          . ranitidine (ZANTAC) 150 MG tablet   Oral   Take 150 mg by mouth 2 (two) times daily.         . tacrolimus (PROGRAF) 1 MG capsule   Oral   Take 3 mg by mouth 2 (two) times daily.          BP 129/85  Pulse 107  Temp(Src) 99.3 F (37.4 C) (Oral)  Resp 18  Ht 6' (1.829 m)  Wt 156 lb (70.761 kg)  BMI 21.15 kg/m2  SpO2 98%  Physical Exam  Nursing note and vitals reviewed. Constitutional: He is oriented to person, place, and time. He appears well-developed and well-nourished. No distress.  HENT:  Head: Normocephalic and atraumatic.  Eyes: EOM are normal.  Neck: Neck supple. No tracheal deviation present.  Cardiovascular: Regular rhythm and normal heart sounds.  Tachycardia present.   No murmur heard. Pulmonary/Chest: Effort normal and breath sounds normal. No respiratory distress. He has  no wheezes. He has no rales.  Abdominal: Soft.  There is no tenderness.  Incision healing by secondary intention  Musculoskeletal: Normal range of motion.  Neurological: He is alert and oriented to person, place, and time.  Skin: Skin is warm and dry.  Psychiatric: He has a normal mood and affect. His behavior is normal.    ED Course  Procedures (including critical care time)  DIAGNOSTIC STUDIES: Oxygen Saturation is 98% on room air, normal by my interpretation.    COORDINATION OF CARE: 2:26 PM: Discussed treatment plan which includes pain medication, anti-emetics and labs.  Pt expressed understanding and agreed to plan.   Labs Review Labs Reviewed  CBC WITH DIFFERENTIAL - Abnormal; Notable for the following:    RBC 2.92 (*)    Hemoglobin 9.2 (*)    HCT 27.8 (*)    Platelets 499 (*)    Monocytes Relative 15 (*)    Monocytes Absolute 1.2 (*)    All other components within normal limits  BASIC METABOLIC PANEL - Abnormal; Notable for the following:    Sodium 128 (*)    Chloride 93 (*)    Glucose, Bld 118 (*)    BUN 39 (*)    Creatinine, Ser 2.12 (*)    GFR calc non Af Amer 31 (*)    GFR calc Af Amer 35 (*)    All other components within normal limits  PRO B NATRIURETIC PEPTIDE - Abnormal; Notable for the following:    Pro B Natriuretic peptide (BNP) 827.5 (*)    All other components within normal limits  HEPATIC FUNCTION PANEL - Abnormal; Notable for the following:    Albumin 2.7 (*)    Indirect Bilirubin 0.2 (*)    All other components within normal limits  LIPASE, BLOOD  URINALYSIS, ROUTINE W REFLEX MICROSCOPIC   Results for orders placed during the hospital encounter of 10/09/13  CBC WITH DIFFERENTIAL      Result Value Range   WBC 8.1  4.0 - 10.5 K/uL   RBC 2.92 (*) 4.22 - 5.81 MIL/uL   Hemoglobin 9.2 (*) 13.0 - 17.0 g/dL   HCT 84.6 (*) 96.2 - 95.2 %   MCV 95.2  78.0 - 100.0 fL   MCH 31.5  26.0 - 34.0 pg   MCHC 33.1  30.0 - 36.0 g/dL   RDW 84.1  32.4 - 40.1 %   Platelets 499 (*) 150 - 400 K/uL    Neutrophils Relative % 72  43 - 77 %   Neutro Abs 5.8  1.7 - 7.7 K/uL   Lymphocytes Relative 13  12 - 46 %   Lymphs Abs 1.1  0.7 - 4.0 K/uL   Monocytes Relative 15 (*) 3 - 12 %   Monocytes Absolute 1.2 (*) 0.1 - 1.0 K/uL   Eosinophils Relative 1  0 - 5 %   Eosinophils Absolute 0.0  0.0 - 0.7 K/uL   Basophils Relative 0  0 - 1 %   Basophils Absolute 0.0  0.0 - 0.1 K/uL  BASIC METABOLIC PANEL      Result Value Range   Sodium 128 (*) 135 - 145 mEq/L   Potassium 4.7  3.5 - 5.1 mEq/L   Chloride 93 (*) 96 - 112 mEq/L   CO2 24  19 - 32 mEq/L   Glucose, Bld 118 (*) 70 - 99 mg/dL   BUN 39 (*) 6 - 23 mg/dL   Creatinine, Ser 0.27 (*) 0.50 - 1.35 mg/dL   Calcium  9.4  8.4 - 10.5 mg/dL   GFR calc non Af Amer 31 (*) >90 mL/min   GFR calc Af Amer 35 (*) >90 mL/min  PRO B NATRIURETIC PEPTIDE      Result Value Range   Pro B Natriuretic peptide (BNP) 827.5 (*) 0 - 125 pg/mL  HEPATIC FUNCTION PANEL      Result Value Range   Total Protein 6.7  6.0 - 8.3 g/dL   Albumin 2.7 (*) 3.5 - 5.2 g/dL   AST 14  0 - 37 U/L   ALT 10  0 - 53 U/L   Alkaline Phosphatase 57  39 - 117 U/L   Total Bilirubin 0.5  0.3 - 1.2 mg/dL   Bilirubin, Direct 0.3  0.0 - 0.3 mg/dL   Indirect Bilirubin 0.2 (*) 0.3 - 0.9 mg/dL  LIPASE, BLOOD      Result Value Range   Lipase 22  11 - 59 U/L    Imaging Review Ct Abdomen Pelvis Wo Contrast  10/09/2013   CLINICAL DATA:  Wound infection. Abdomen pain, status post surgery.  EXAM: CT ABDOMEN AND PELVIS WITHOUT CONTRAST  TECHNIQUE: Multidetector CT imaging of the abdomen and pelvis was performed following the standard protocol without intravenous contrast.  COMPARISON:  September 21, 2013, September 19, 2013  FINDINGS: There 2 pigtail drainage tubes in the pelvis and lower abdomen. The previously noted collections in the central lower abdomen and pelvis is almost completely resolved. There is mild residual inflammation and stranding in the mesenteries of the lower abdomen and pelvis.  There  are multiple low-density lesions within the liver. The spleen, pancreas adrenal glands are normal. There is probable sludge/stone within the gallbladder. The gallbladder is distended without pericholecystic inflammation. There is mild bilateral chronic perinephric stranding. No hydronephrosis is noted bilaterally. There is evidence of prior colon surgery. There is no evidence of bowel obstruction. There is atherosclerosis of the abdominal aorta without aneurysmal dilatation.  Fluid-filled bladder is normal. There is left inguinal herniation of mesenteric fat measuring 4.1 cm. There is mild dependent atelectasis of the posterior lung bases. Degenerative joint changes of the bones are noted.  IMPRESSION: There 2 pigtail drainage tubes in the pelvis and lower abdomen. The previously noted collections in the central lower abdomen and pelvis is almost completely resolved. There is mild residual inflammation and stranding in the mesenteries of the lower abdomen and pelvis.   Electronically Signed   By: Sherian Rein M.D.   On: 10/09/2013 17:13   Dg Chest 1 View  10/09/2013   CLINICAL DATA:  Nausea, wound infection  EXAM: CHEST - 1 VIEW  COMPARISON:  None.  FINDINGS: The cardiac shadow is within normal limits. A right-sided PICC line is seen with the tip at the cavoatrial junction. The lungs are well-aerated without focal infiltrate.  IMPRESSION: No acute abnormality noted.   Electronically Signed   By: Alcide Clever M.D.   On: 10/09/2013 16:36    EKG Interpretation     Ventricular Rate:  102 PR Interval:  166 QRS Duration: 60 QT Interval:  314 QTC Calculation: 409 R Axis:   68 Text Interpretation:  Sinus tachycardia Right atrial enlargement Septal infarct (cited on or before 22-Dec-2001) Abnormal ECG When compared with ECG of 08-Sep-2013 13:33, No significant change was found            MDM   1. Renal failure   2. Dehydration    Urinalysis is still pending. However patient was significant  change in  renal function. This is consistent with acute renal failure but may be prerenal in nature due to dehydration. Patient's abdominal CT without any significant new or worse findings actually showed improvement. Patient's chest x-rays negative. Patient is a heart transplant patient for 9 years ago is followed at [facility] for this twice a year. Patient seems to be improving some with some hydration here. Chest x-ray shows no evidence of pulmonary edema. Patient's EKG has some mild sinus tachycardia otherwise no sniffing change from his preoperative EKG on September 22. Patient's vital signs improves some with fluid heart rate 98 blood pressure 128 systolic.  Patient will require admission discussed with hospitalist. Patient's primary care Dr. locally his [employer].    I personally performed the services described in this documentation, which was scribed in my presence. The recorded information has been reviewed and is accurate.     Shelda Jakes, MD 10/09/13 1747  Discussed with hospitalist. There was discussion about whether patient needed to be transferred to [name] to the transplant service. Or whether the patient needed to be transferred [facility] where [facility] was [facility] on and cardiology is available to the weekend. Was decided to try him here overnight basic IV fluids cardiology consult in the morning but not improved may require transfer. Currently it this point in time CT scan of the abdomen they seem like there is not a acute surgical problem going on. No evidence of sepsis. Urinalysis is still pending in and out cath has been ordered.  Shelda Jakes, MD 10/09/13 [phone]

## 2013-10-09 NOTE — ED Notes (Addendum)
Patient to ED per request of his Dr. Because patient has not been drinking much recently. Patient states that he was sent up here because he is dehydrated. Patient states his doctor told him that he needs fluids. Patient states that he has felt nauseated, vomited 1 time in the past 24 hours. Patient was place an ostomy September 23rd and has had back pain, shoulder pain, and lower abdominal pain ever since. Patient's ostomy in the LLQ is red and beefy. Patient has 2 JP drains, 1 on his right back and the other on his left side, beside his ostomy on the left. Both draining serous fluid approx 12.5cc at this time. Patient states that these infections began while patient was in the hospital for the ostomy. Patient has surgical scar midline lower abdomen approx 3 inches long. Wound packed, scant serosanguinous drainage on packing. Edges of surgical scar and pink. Patient grimacing and appears to be in pain. Wife at bedside alert and attentive to patient. Patient has appropriate color for ethnicity. Alert and oriented X4, able to answer questions and participate in assessment.PICC line present on upper right arm upon arrival to ED, covered in dressing, no signs or symptoms of infection of the PICC at this time.

## 2013-10-10 DIAGNOSIS — N19 Unspecified kidney failure: Secondary | ICD-10-CM

## 2013-10-10 DIAGNOSIS — I5032 Chronic diastolic (congestive) heart failure: Secondary | ICD-10-CM

## 2013-10-10 DIAGNOSIS — E43 Unspecified severe protein-calorie malnutrition: Secondary | ICD-10-CM | POA: Insufficient documentation

## 2013-10-10 DIAGNOSIS — N179 Acute kidney failure, unspecified: Principal | ICD-10-CM

## 2013-10-10 DIAGNOSIS — E860 Dehydration: Secondary | ICD-10-CM

## 2013-10-10 LAB — OSMOLALITY: Osmolality: 287 mOsm/kg (ref 275–300)

## 2013-10-10 LAB — BASIC METABOLIC PANEL
BUN: 29 mg/dL — ABNORMAL HIGH (ref 6–23)
CO2: 23 mEq/L (ref 19–32)
Calcium: 9 mg/dL (ref 8.4–10.5)
Chloride: 99 mEq/L (ref 96–112)
Creatinine, Ser: 1.74 mg/dL — ABNORMAL HIGH (ref 0.50–1.35)
GFR calc Af Amer: 45 mL/min — ABNORMAL LOW (ref >90)
Potassium: 5.3 mEq/L — ABNORMAL HIGH (ref 3.5–5.1)

## 2013-10-10 LAB — CBC
HCT: 27.4 % — ABNORMAL LOW (ref 39.0–52.0)
Hemoglobin: 8.9 g/dL — ABNORMAL LOW (ref 13.0–17.0)
MCV: 95.8 fL (ref 78.0–100.0)
Platelets: 434 10*3/uL — ABNORMAL HIGH (ref 150–400)
RBC: 2.86 MIL/uL — ABNORMAL LOW (ref 4.22–5.81)
RDW: 15.2 % (ref 11.5–15.5)
WBC: 6.6 10*3/uL (ref 4.0–10.5)

## 2013-10-10 MED ORDER — PRO-STAT SUGAR FREE PO LIQD
30.0000 mL | Freq: Three times a day (TID) | ORAL | Status: DC
  Administered 2013-10-10 – 2013-10-11 (×4): 30 mL via ORAL
  Filled 2013-10-10 (×4): qty 30

## 2013-10-10 MED ORDER — SODIUM POLYSTYRENE SULFONATE 15 GM/60ML PO SUSP
15.0000 g | Freq: Once | ORAL | Status: AC
  Administered 2013-10-10: 15 g via ORAL
  Filled 2013-10-10: qty 60

## 2013-10-10 MED ORDER — ADULT MULTIVITAMIN W/MINERALS CH
1.0000 | ORAL_TABLET | Freq: Every day | ORAL | Status: DC
  Administered 2013-10-10 – 2013-10-11 (×2): 1 via ORAL
  Filled 2013-10-10 (×2): qty 1

## 2013-10-10 MED ORDER — DEXTROSE 5 % IV SOLN
INTRAVENOUS | Status: AC
  Filled 2013-10-10: qty 10

## 2013-10-10 NOTE — Progress Notes (Signed)
INITIAL NUTRITION ASSESSMENT  DOCUMENTATION CODES Per approved criteria  -Severe malnutrition in the context of chronic illness   Pt meets criteria for severe MALNUTRITION in the context of chronic illness as evidenced by energy intake </=75% for >/= 1 month and unintentional wt loss of >5% in 1 month.  INTERVENTION:  Recommend d/c Ensure due to taste fatigue  Magic Cup with lunch and dinner meals, each supplement provides 290 kcal and 9  grams of protein.  ProStat 30 ml TID (each 30 ml provides 100 kcal, 15 gr protein)  MVI po daily  Advance diet as tolerated to Regular  NUTRITION DIAGNOSIS: Malnutrition (severe) related to inadequate energy intake as evidenced by pt reported diet hx and unplanned wt loss of 21#, 12% <30days.      Goal: Pt to meet >/= 90% of their estimated nutrition needs   Monitor:  Diet prescription, meals and supplements, labs and wt trends  Reason for Assessment: Malnutrition screen score= 3  67 y.o. male  Admitting Dx: AKI (acute kidney injury)  ASSESSMENT: His hx is significant for heart transplant, squamous cell cancer (left tonsil). He is s/p colostomy last month at [facility] and abdominal abscess. He was transferred to [facility] from [facility] and was discharged from there last week per spouse.  Pt c/o "non-existent" appetite. The foods that he had previously liked, now he is unable to tolerate the taste (may be mediation related?). He has been taking Marinol for the past week.  If appetite not improved in the next week recommend consider changing to different appetite stimulant. For > month he says po intake has been minimal. He has experienced additonal wt loss of 21#,12% <30 days. Current wt reflects worsening malnutrition. Pt may need to consider alternate nutrition source if he continues to be unable to meet nutrition requirements orally.  He has been drinking Ensure but says "I'm burn-out" on that. Will try protein modular and Magic Cup and add MVI suspect he is  nutritionally deficient.  Patient Active Problem List   Diagnosis Date Noted  . AKI (acute kidney injury) 10/09/2013  . Quality of life palliative care encounter 09/16/2013  . Malnutrition of moderate degree 09/12/2013  . Hypomagnesemia 09/12/2013  . Chronic diastolic heart failure 09/08/2013  . Pre-operative cardiovascular examination 09/08/2013  . Fever 09/08/2013  . Leukocytosis 09/08/2013  . CKD (chronic kidney disease) stage 3, GFR 30-59 ml/min 09/08/2013  . HTN (hypertension) 09/07/2013  . Diverticulitis with perforation 09/07/2013  . Head and neck cancer 07/26/2013  . History of heart transplant/chronic immunosupression 06/17/2013   Nutrition Focused Physical Exam:   Subcutaneous Fat:  Orbital Region: moderate loss Upper Arm Region: moderate loss Thoracic and Lumbar Region: not assessed   Muscle:  Temple Region: mild wasting  Clavicle Bone Region: moderate wasting  Clavicle and Acromion Bone Region: mild-moderate wasting  Scapular Bone Region: not assessed Dorsal Hand: moderate-severe wasting  Patellar Region: mild wasting  Anterior Thigh Region: severe wasting  Posterior Calf Region: not assessed  Edema: none   Height: Ht Readings from Last 1 Encounters:  10/09/13 6' (1.829 m)    Weight: Wt Readings from Last 1 Encounters:  10/09/13 157 lb 10.1 oz (71.5 kg)  Obtain re-wt to confirm severe wt loss   Ideal Body Weight: 178# (80.9 kg)  % Ideal Body Weight: 89%  Wt Readings from Last 10 Encounters:  10/09/13 157 lb 10.1 oz (71.5 kg)  09/18/13 179 lb 3.7 oz (81.3 kg)  09/18/13 179 lb 3.7 oz (81.3 kg)  08/20/13 176 lb (79.833 kg)  07/23/13 178 lb (80.74 kg)  07/07/13 176 lb 6.4 oz (80.015 kg)  07/07/13 176 lb 6.4 oz (80.015 kg)  06/13/13 182 lb (82.555 kg)    Usual Body Weight: 179-182#  % Usual Body Weight: 88%  BMI:  Body mass index is 21.37 kg/(m^2). normal range  Estimated Nutritional Needs: Kcal: 2160-2420 Protein: 94-108 Fluid: 2200  ml/day  Skin: abdominal incision with minimal purulent drainage, and 2 closed system JP (left abdomen and right back) drains   Diet Order: Full Liquid  EDUCATION NEEDS: -Education not appropriate at this time   Intake/Output Summary (Last 24 hours) at 10/10/13 0949 Last data filed at 10/10/13 0548  Gross per 24 hour  Intake      0 ml  Output   1210 ml  Net  -1210 ml    Last BM:  Colostomy (300 ml) output 10/10/13  Labs:   Recent Labs Lab 10/09/13 1256 10/10/13 0840  NA 128* 132*  K 4.7 5.3*  CL 93* 99  CO2 24 23  BUN 39* 29*  CREATININE 2.12* 1.74*  CALCIUM 9.4 9.0  GLUCOSE 118* 134*    CBG (last 3)  No results found for this basename: GLUCAP,  in the last 72 hours  Scheduled Meds: . amLODipine  5 mg Oral Daily  . carvedilol  25 mg Oral BID WC  . cefTRIAXone (ROCEPHIN) 1 g IVPB  1 g Intravenous Q24H  . dronabinol  2.5 mg Oral BID AC  . enoxaparin (LOVENOX) injection  40 mg Subcutaneous Q24H  . famotidine  20 mg Oral BID  . feeding supplement (ENSURE COMPLETE)  237 mL Oral TID BM  . ketoconazole  1 application Topical BID  . loperamide  2 mg Oral BID  . metroNIDAZOLE  500 mg Oral TID  . nystatin  5 mL Oral QID  . predniSONE  5 mg Oral Daily  . tacrolimus  3 mg Oral BID    Continuous Infusions: . sodium chloride Stopped (10/09/13 1903)    Past Medical History  Diagnosis Date  . Hypertension   . H/O heart transplant 2003  . Wears glasses   . Wears dentures     upper  . Renal insufficiency   . Cancer     tonsil  . Throat cancer     Past Surgical History  Procedure Laterality Date  . Heart transplant N/A 2003    [facility]  . Appendectomy    . Colonoscopy    . Hernia repair  2008    rt ing  . Cardiac catheterization  2011  . Tonsillectomy Left 07/07/2013    Procedure: TONSILLECTOMY;  Surgeon: Darletta Moll, MD;  Location: [facility];  Service: ENT;  Laterality: Left;  . Direct laryngoscopy N/A 07/07/2013    Procedure: DIRECT  LARYNGOSCOPY WITH BIOPSY;  Surgeon: Darletta Moll, MD;  Location:  SURGERY CENTER;  Service: ENT;  Laterality: N/A;  . Colon resection N/A 09/09/2013    Procedure: Lap. Assisted Colectomy, lysis of adhesions, Colostomy;  Surgeon: Atilano Ina, MD;  [facility] OR;  Service: General;  Laterality: N/A;    Royann Shivers MS,RD,LDN,CSG Office: [phone] Pager: [phone]

## 2013-10-10 NOTE — Care Management Note (Signed)
    Page 1 of 2   10/10/2013     12:50:18 PM   CARE MANAGEMENT NOTE 10/10/2013  Patient:  Peter Clarke, Peter Clarke   Account Number:  [id number]  Date Initiated:  10/10/2013  Documentation initiated by:  Sharrie Rothman  Subjective/Objective Assessment:   Pt admitted from home with dehydration and renal failure. Pt lives with his wife and will return home at discharge. Pt is active with [facility] R[facility] and PT. Pt receiving IV AB at home prior to admission and pts wife has been taking care of IV AB     Action/Plan:   and providing wound care to pt. CM will arrange resumption of HH at discharge. Weekend staff will call [facility] when d/c written. MD will need to rewrite IV AB orders for home.   Anticipated DC Date:  10/13/2013   Anticipated DC Plan:  HOME W HOME HEALTH SERVICES      DC Planning Services  CM consult      [facility] Building Choice  Resumption Of Svcs/PTA Provider   Choice offered to / List presented to:  C-1 Patient        HH arranged  HH-1 RN  HH-2 PT  IV Antibiotics      HH agency  [facility]   Status of service:  Completed, signed off Medicare Important Message given?   (If response is "NO", the following Medicare IM given date fields will be blank) Date Medicare IM given:   Date Additional Medicare IM given:    Discharge Disposition:  HOME W HOME HEALTH SERVICES  Per UR Regulation:    If discussed at Long Length of Stay Meetings, dates discussed:    Comments:  10/10/13 1250 Arlyss Queen, RN BSN CM

## 2013-10-10 NOTE — Progress Notes (Signed)
Pt PCR screen for Cdiff negative. Isolation removed.

## 2013-10-10 NOTE — Progress Notes (Addendum)
[facility] PROGRESS NOTE  Peter Clarke FAO:[id number] DOB: [birth year] DOA: 10/09/2013 PCP: [name], MD  Assessment/Plan:  1. Dehydration/hyponatremia -suspect from combination of poor appetite/PO intake and increase colostomy output -continue IVF today, advance diet, encourage PO intake -r/o Cdiff -he was started on imodium Bid at [facility] due to increased ostomy output(Cdiff and CMV negative then), would increase dose to TID once Cdiff confirmed negative now  2. Recent Complex admission with Diverticulitis/perforation, s/p Hartmann's procedure at [facility], complicated by abdominal abscesses, he had 1 drain placed at [facility], Cx grew [facility], Then transferred to [facility], then had 3 more drains placed and finally 2 of then removed before discharge and was discharged home with 2 drains and on IV Ceftriaxone/Flagyl for 3 weeks on 10/17 and due to FU with Dr.Eric Wilson([facility]) 11/20, this should be preponed. CT Abd pelvis 10/23 shows resolution of abscesses, will not need drains much longer -Has Fu with ID at [facility] 10/29  3. S/p Heart transplant for NICM -continue Prograf and prednisone -continue Coreg  4. AKI -creatinine 2.1 on admission, improved to 1.7 today with hydration -continue IVF, creatinine was 1.7 at discharge from [facility]  5. Hyperkalemia -due to ARF, kayexalate x1 now -bmet in am  6. Head and neck cancer/Stage 3 tonsil -FU with Oncologist as outpatient  7. Protein calorie malnutrition in setting of Prolong illness/cancer/chemo/anorexia/current abs use -continue marinol and ensure  DVt proph: lovenox  Code Status: Full Code Family Communication: d/w wife at bedside Disposition Plan: home tomorrow   Antibiotics:  IV ceftraixone/Flagyl from OSH  HPI/Subjective: Feels much better, more energy  Objective: Filed Vitals:   10/10/13 1000  BP: 161/96  Pulse: 95  Temp: 98.1 F (36.7 C)  Resp: 18    Intake/Output Summary (Last 24 hours) at 10/10/13 1240 Last data  filed at 10/10/13 0800  Gross per 24 hour  Intake    480 ml  Output   1210 ml  Net   -730 ml   Filed Weights   10/09/13 1204 10/09/13 1959  Weight: 70.761 kg (156 lb) 71.5 kg (157 lb 10.1 oz)    Exam:   General:  AAOx3, no distress, frail appearing  Cardiovascular: S1S2/RRR  Respiratory: CTAB  Abdomen: midline laparotomy incision with dressing, Soft, Nt, ND, BS present, colostomy with liquid stool, 2 JP drains in situ-minimal output  Musculoskeletal: no edema c/c  Data Reviewed: Basic Metabolic Panel:  Recent Labs Lab 10/09/13 1256 10/10/13 0840  NA 128* 132*  K 4.7 5.3*  CL 93* 99  CO2 24 23  GLUCOSE 118* 134*  BUN 39* 29*  CREATININE 2.12* 1.74*  CALCIUM 9.4 9.0   Liver Function Tests:  Recent Labs Lab 10/09/13 1256  AST 14  ALT 10  ALKPHOS 57  BILITOT 0.5  PROT 6.7  ALBUMIN 2.7*    Recent Labs Lab 10/09/13 1256  LIPASE 22   No results found for this basename: AMMONIA,  in the last 168 hours CBC:  Recent Labs Lab 10/09/13 1256 10/10/13 0840  WBC 8.1 6.6  NEUTROABS 5.8  --   HGB 9.2* 8.9*  HCT 27.8* 27.4*  MCV 95.2 95.8  PLT 499* 434*   Cardiac Enzymes: No results found for this basename: CKTOTAL, CKMB, CKMBINDEX, TROPONINI,  in the last 168 hours BNP (last 3 results)  Recent Labs  10/09/13 1256  PROBNP 827.5*   CBG: No results found for this basename: GLUCAP,  in the last 168 hours  No results found for this or any previous visit (  from the past 240 hour(s)).   Studies: Ct Abdomen Pelvis Wo Contrast  10/09/2013   CLINICAL DATA:  Wound infection. Abdomen pain, status post surgery.  EXAM: CT ABDOMEN AND PELVIS WITHOUT CONTRAST  TECHNIQUE: Multidetector CT imaging of the abdomen and pelvis was performed following the standard protocol without intravenous contrast.  COMPARISON:  September 21, 2013, September 19, 2013  FINDINGS: There 2 pigtail drainage tubes in the pelvis and lower abdomen. The previously noted collections in the central  lower abdomen and pelvis is almost completely resolved. There is mild residual inflammation and stranding in the mesenteries of the lower abdomen and pelvis.  There are multiple low-density lesions within the liver. The spleen, pancreas adrenal glands are normal. There is probable sludge/stone within the gallbladder. The gallbladder is distended without pericholecystic inflammation. There is mild bilateral chronic perinephric stranding. No hydronephrosis is noted bilaterally. There is evidence of prior colon surgery. There is no evidence of bowel obstruction. There is atherosclerosis of the abdominal aorta without aneurysmal dilatation.  Fluid-filled bladder is normal. There is left inguinal herniation of mesenteric fat measuring 4.1 cm. There is mild dependent atelectasis of the posterior lung bases. Degenerative joint changes of the bones are noted.  IMPRESSION: There 2 pigtail drainage tubes in the pelvis and lower abdomen. The previously noted collections in the central lower abdomen and pelvis is almost completely resolved. There is mild residual inflammation and stranding in the mesenteries of the lower abdomen and pelvis.   Electronically Signed   By: Sherian Rein M.D.   On: 10/09/2013 17:13   Dg Chest 1 View  10/09/2013   CLINICAL DATA:  Nausea, wound infection  EXAM: CHEST - 1 VIEW  COMPARISON:  None.  FINDINGS: The cardiac shadow is within normal limits. A right-sided PICC line is seen with the tip at the cavoatrial junction. The lungs are well-aerated without focal infiltrate.  IMPRESSION: No acute abnormality noted.   Electronically Signed   By: Alcide Clever M.D.   On: 10/09/2013 16:36    Scheduled Meds: . amLODipine  5 mg Oral Daily  . carvedilol  25 mg Oral BID WC  . cefTRIAXone (ROCEPHIN) 1 g IVPB  1 g Intravenous Q24H  . dronabinol  2.5 mg Oral BID AC  . enoxaparin (LOVENOX) injection  40 mg Subcutaneous Q24H  . famotidine  20 mg Oral BID  . feeding supplement (ENSURE COMPLETE)  237 mL  Oral TID BM  . feeding supplement (PRO-STAT SUGAR FREE 64)  30 mL Oral TID WC  . ketoconazole  1 application Topical BID  . loperamide  2 mg Oral BID  . metroNIDAZOLE  500 mg Oral TID  . multivitamin with minerals  1 tablet Oral Daily  . nystatin  5 mL Oral QID  . predniSONE  5 mg Oral Daily  . sodium polystyrene  15 g Oral Once  . tacrolimus  3 mg Oral BID   Continuous Infusions: . sodium chloride Stopped (10/09/13 1903)    Principal Problem:   AKI (acute kidney injury) Active Problems:   History of heart transplant/chronic immunosupression   HTN (hypertension)   Diverticulitis with perforation   CKD (chronic kidney disease) stage 3, GFR 30-59 ml/min   Protein-calorie malnutrition, severe    Time spent: , including time to review records from [facility]  [facility] Pager [phone]. If 7PM-7AM, please contact night-coverage at [email or web address], password [facility] 10/10/2013, 12:40 PM  LOS: 1 day

## 2013-10-10 NOTE — Progress Notes (Signed)
UR chart review completed.  

## 2013-10-11 DIAGNOSIS — Z941 Heart transplant status: Secondary | ICD-10-CM

## 2013-10-11 LAB — BASIC METABOLIC PANEL
BUN: 22 mg/dL (ref 6–23)
CO2: 24 mEq/L (ref 19–32)
Calcium: 9 mg/dL (ref 8.4–10.5)
Chloride: 101 mEq/L (ref 96–112)
Creatinine, Ser: 1.57 mg/dL — ABNORMAL HIGH (ref 0.50–1.35)
GFR calc non Af Amer: 44 mL/min — ABNORMAL LOW (ref >90)
Glucose, Bld: 103 mg/dL — ABNORMAL HIGH (ref 70–99)

## 2013-10-11 LAB — URINE CULTURE
Colony Count: NO GROWTH
Culture: NO GROWTH

## 2013-10-11 LAB — CBC
HCT: 26 % — ABNORMAL LOW (ref 39.0–52.0)
Hemoglobin: 8.4 g/dL — ABNORMAL LOW (ref 13.0–17.0)
MCH: 31.2 pg (ref 26.0–34.0)
MCV: 96.7 fL (ref 78.0–100.0)
RBC: 2.69 MIL/uL — ABNORMAL LOW (ref 4.22–5.81)
WBC: 5.7 10*3/uL (ref 4.0–10.5)

## 2013-10-11 NOTE — Discharge Summary (Signed)
Physician Discharge Summary  Peter Clarke ZOX:[id number] DOB: 07/01/1946 DOA: 10/09/2013  PCP: [name], MD  Admit date: 10/09/2013 Discharge date: 10/11/2013  Time spent:  Recommendations for Outpatient Follow-up:  1. Home health for IV Abx 2. PCP in 1 week 3. Dr.Eric Wilson, Surgery in 1-2 weeks 4. ID at [facility] on 10/29  Discharge Diagnoses:  Principal Problem:   AKI (acute kidney injury)   Dehydration   Hyperkalemia   History of heart transplant/chronic immunosupression   HTN (hypertension)   Diverticulitis with perforation   CKD (chronic kidney disease) stage 3, GFR 30-59 ml/min   Protein-calorie malnutrition, severe   Discharge Condition:stable  Diet recommendation: low sodium  Filed Weights   10/09/13 1204 10/09/13 1959  Weight: 70.761 kg (156 lb) 71.5 kg (157 lb 10.1 oz)    History of present illness:  67 year old male with complex medical problems, who was recently discharged from [facility] on Friday last week, came to the ED with concerns for dehydration. Patient was seen by his home nurse will visit him this a.m. and was concerned for dehydration based on the labs taken at the PCPs office 2 days ago. Patient has been having nausea and mild dyspnea but denies any fever. He does have chronic abdominal pain since the surgery last month. Patient hadDiverticulitis with perforation s/p laparoscopic assisted Hartman's procedure (sigmoid colectomy with end colostomy) w/ lysis of adhesions. He also has history of heart transplant/chronic immunosuppression/history of Chronic Systolic heart failure/New grade 2 DD. Patient is on chronic immunosuppressive therapy for the heart transplant. He also has been prescribed antibiotics Rocephin and Flagyl daily. Patient took IV Rocephin this morning. He denies chest pain or shortness of breath. But said that his appetite is down he is not able to eat due to Nausea. Patient has colostomy bag in place, and has not noticed any  increase in the volume of stool.  Hospital Course:  1. Dehydration/hyponatremia  -suspect from combination of poor appetite/PO intake and increase colostomy output  -hydrated with IVF, advance diet, encourage PO intake  -Cdiff ruled out -he was started on imodium Bid at [facility] due to increased ostomy output(Cdiff and CMV negative then), could increase dose to TID, if continues to have increased ostomy output, this improved back to baseline by discharge.   2. Recent Complex admission with Diverticulitis/perforation, s/p Hartmann's procedure at [facility], complicated by abdominal abscesses, he had 1 drain placed at [facility], Cx grew [facility], Then transferred to [facility], then had 3 more drains placed and finally 2 of then removed before discharge and was discharged home with 2 drains and on IV Ceftriaxone and PO Flagyl for 3 weeks on 10/17 and due to FU with Dr.Eric Wilson([facility]) 11/20, this should be preponed.  CT Abd pelvis 10/23 shows resolution of abscesses, will not need drains much longer  -Has Fu with ID at [facility] 10/29, they will determine when to stop Abx  3. S/p Heart transplant for NICM  -continue Prograf and prednisone  -continue Coreg   4. AKI  -creatinine 2.1 on admission, improved to 1.57 today with hydration  - creatinine was 1.7 at discharge from [facility]   5. Hyperkalemia  -due to ARF, corrected with kayexalate x1   6. Head and neck cancer/Stage 3 tonsil  -FU with Oncologist as outpatient   7. Protein calorie malnutrition in setting of Prolong illness/cancer/chemo/anorexia/current abs use  -continue marinol and ensure      Discharge Exam: Filed Vitals:   10/11/13 0531  BP: 113/76  Pulse: 94  Temp: 98.3 F (36.8 C)  Resp: 20    General: AAOx3 Cardiovascular:S1S2/RRR Respiratory: CTAB  Discharge Instructions  Discharge Orders   Future Appointments Provider Department Dept Phone   11/06/2013 10:15 AM Atilano Ina, MD [facility] Surgery, [place] [phone]   Future Orders  Complete By Expires   Diet - low sodium heart healthy  As directed    Increase activity slowly  As directed        Medication List         amLODipine 5 MG tablet  Commonly known as:  NORVASC  Take 5 mg by mouth daily.     carvedilol 25 MG tablet  Commonly known as:  COREG  Take 25 mg by mouth 2 (two) times daily with a meal.     CENTRUM SILVER PO  Take 1 tablet by mouth daily.     dextrose 5 % SOLN 50 mL with cefTRIAXone 1 G SOLR 1 g  Inject 1 g into the vein daily.     dronabinol 2.5 MG capsule  Commonly known as:  MARINOL  Take 2.5 mg by mouth 2 (two) times daily before a meal.     feeding supplement (ENSURE COMPLETE) Liqd  Take 237 mLs by mouth 3 (three) times daily between meals.     ketoconazole 2 % cream  Commonly known as:  NIZORAL  Apply 1 application topically 2 (two) times daily.     loperamide 2 MG capsule  Commonly known as:  IMODIUM  Take 1 capsule (2 mg total) by mouth 4 (four) times daily.     loperamide 2 MG capsule  Commonly known as:  IMODIUM  Take 2 mg by mouth 2 (two) times daily.     metroNIDAZOLE 500 MG tablet  Commonly known as:  FLAGYL  Take 500 mg by mouth 3 (three) times daily.     nystatin 100000 UNIT/ML suspension  Commonly known as:  MYCOSTATIN  Take 5 mLs (500,000 Units total) by mouth 4 (four) times daily.     ondansetron 4 MG tablet  Commonly known as:  ZOFRAN  Take 4 mg by mouth every 8 (eight) hours as needed for nausea.     Oxycodone HCl 10 MG Tabs  Take 10 mg by mouth every 6 (six) hours as needed (pain).     predniSONE 5 MG tablet  Commonly known as:  DELTASONE  Take 5 mg by mouth daily.     ranitidine 150 MG tablet  Commonly known as:  ZANTAC  Take 150 mg by mouth 2 (two) times daily.     tacrolimus 1 MG capsule  Commonly known as:  PROGRAF  Take 3 mg by mouth 2 (two) times daily.       Allergies  Allergen Reactions  . Pepto-Bismol [Bismuth Subsalicylate]     rash       Follow-up Information   Follow  up with [facility].   Contact information:   [address] [place] [address] [phone]      Follow up with LUKING,SCOTT, MD. Schedule an appointment as soon as possible for a visit in 1 week.   Specialty:  Family Medicine   Contact information:   [address] [place] [address] [phone]        The results of significant diagnostics from this hospitalization (including imaging, microbiology, ancillary and laboratory) are listed below for reference.    Significant Diagnostic Studies: Ct Abdomen Pelvis Wo Contrast  10/09/2013  CLINICAL DATA:  Wound infection. Abdomen pain, status post surgery.  EXAM: CT ABDOMEN AND PELVIS WITHOUT CONTRAST  TECHNIQUE: Multidetector CT imaging of the abdomen and pelvis was performed following the standard protocol without intravenous contrast.  COMPARISON:  September 21, 2013, September 19, 2013  FINDINGS: There 2 pigtail drainage tubes in the pelvis and lower abdomen. The previously noted collections in the central lower abdomen and pelvis is almost completely resolved. There is mild residual inflammation and stranding in the mesenteries of the lower abdomen and pelvis.  There are multiple low-density lesions within the liver. The spleen, pancreas adrenal glands are normal. There is probable sludge/stone within the gallbladder. The gallbladder is distended without pericholecystic inflammation. There is mild bilateral chronic perinephric stranding. No hydronephrosis is noted bilaterally. There is evidence of prior colon surgery. There is no evidence of bowel obstruction. There is atherosclerosis of the abdominal aorta without aneurysmal dilatation.  Fluid-filled bladder is normal. There is left inguinal herniation of mesenteric fat measuring 4.1 cm. There is mild dependent atelectasis of the posterior lung bases. Degenerative joint changes of the bones are noted.  IMPRESSION: There 2 pigtail drainage tubes in the pelvis and lower  abdomen. The previously noted collections in the central lower abdomen and pelvis is almost completely resolved. There is mild residual inflammation and stranding in the mesenteries of the lower abdomen and pelvis.   Electronically Signed   By: Sherian Rein M.D.   On: 10/09/2013 17:13   Ct Abdomen Pelvis Wo Contrast  09/19/2013   CLINICAL DATA:  History of diverticulitis treated with surgery 10 days ago. Patient has fever and abdominal pain.  EXAM: CT ABDOMEN AND PELVIS WITHOUT CONTRAST  TECHNIQUE: Multidetector CT imaging of the abdomen and pelvis was performed following the standard protocol without intravenous contrast.  COMPARISON:  09/07/2013  FINDINGS: Since the prior CT, abdominal surgery has been performed. There is a midline defect along the skin and subcutaneous soft tissues extending to the fascia reflecting an open incision. The fascia was closed. A colostomy has been formed in the left lower quadrant. The sigmoid colon in its midportion has been ligated with a bowel anastomosis staple line.  There is a collection in the low mid abdomen anterior to the aortic bifurcation and iliac vessels. It contains fluid and small nondependent bubbles of air. Its wall is somewhat ill-defined. It measures 6.8 cm x 2.5 cm by 10.6 cm in size. Along its right superior margin it abuts an area of hazy and nodular soft tissue density with interspersed foci of air. This lies in the right mid abdomen and is contiguous with similar opacity and air in the central abdomen. No other formed fluid collection is seen.  The left colon is mostly decompressed. The transverse and ascending colon are normal in caliber. There are air-fluid levels in this portion of the colon. No wall thickening. There is mild dilation of proximal small bowel without a discrete transition point. Small bowel air-fluid levels are also noted. This is likely due to a mild diffuse adynamic ileus.  There are small bilateral pleural effusions. There is  dependent lower lobe atelectasis.  Multiple small low-density lesions are noted in the liver. These were present on the prior exam and were seen on exam dated 02/11/2009. They are likely cysts. The liver is otherwise unremarkable. Normal spleen. The gallbladder is distended. There are dependent gallstones. No gallbladder wall thickening or adjacent inflammation is seen. There is no bile duct dilation. Normal pancreas. No adrenal masses. There is  mild bilateral renal cortical thinning. There are no renal masses or stones. No hydronephrosis. Normal ureters and bladder. The prostate is enlarged. There is a fat containing left inguinal hernia which stable.  There are degenerative changes of the visualized spine.  IMPRESSION: 1. Fluid collection with small bubbles of air in the low central abdomen measuring 10.6 cm in greatest dimension. This is consistent with an early abscess. There is abnormal extraluminal air and adjacent hazy and nodular soft tissue attenuation in the right mid abdomen that extends across the central abdomen. This has an inflammatory appearance. The nodular areas within this may reflect prominent lymph nodes. No additional formed fluid collection is seen to suggest additional abscesses. 2. Air-fluid levels within the colon and small bowel with prominence of the proximal small bowel likely an adynamic ileus. No convincing obstruction. 3. Small bilateral pleural effusions with dependent lung base atelectasis. 4. Left lower quadrant colostomy. Hartman's pouch has been formed from the lower sigmoid colon. 5. Other chronic findings as detailed stable from the prior exams.   Electronically Signed   By: Amie Portland M.D.   On: 09/19/2013 16:04   Dg Chest 1 View  10/09/2013   CLINICAL DATA:  Nausea, wound infection  EXAM: CHEST - 1 VIEW  COMPARISON:  None.  FINDINGS: The cardiac shadow is within normal limits. A right-sided PICC line is seen with the tip at the cavoatrial junction. The lungs are  well-aerated without focal infiltrate.  IMPRESSION: No acute abnormality noted.   Electronically Signed   By: Alcide Clever M.D.   On: 10/09/2013 16:36   Dg Chest 2 View  09/17/2013   *RADIOLOGY REPORT*  Clinical Data: Fever.  Hypertension.  CHEST - 2 VIEW  Comparison: Chest radiograph 09/09/2013.  Findings: Low lung volumes.  Stable cardiac and mediastinal contours status post median sternotomy.  Redemonstrated fractured superior sternotomy wire.  Bilateral lower lung heterogeneous pulmonary opacities.  Small bilateral pleural effusions.  Regional skeleton is unremarkable.  IMPRESSION: Small bilateral pleural effusions.  Low lung volumes.  Bibasilar opacities likely represent atelectasis.  Infection not excluded.   Original Report Authenticated By: Annia Belt, M.D   Ct Head Wo Contrast  09/17/2013   CLINICAL DATA:  Confusion  EXAM: CT HEAD WITHOUT CONTRAST  TECHNIQUE: Contiguous axial images were obtained from the base of the skull through the vertex without intravenous contrast.  COMPARISON:  CT head without contrast 08/20/2013  FINDINGS: No acute cortical infarct, hemorrhage, or mass lesion is present. The ventricles are of normal size. No significant extra-axial fluid collection is present.  A fluid level is again seen within the right maxillary sinus. The remaining paranasal sinuses and the mastoid air cells are clear. The osseous skull is intact.  IMPRESSION: 1. Normal CT appearance of the brain. 2. Right maxillary sinusitis.   Electronically Signed   By: Gennette Pac   On: 09/17/2013 20:34   Ct Guided Abscess Drain  09/21/2013   *RADIOLOGY REPORT*  Indication: Post colectomy with Hartmann's pouch formation and temporary loop ileostomy, now with an intra abdominal abscess.  CT GUIDED ABDOMINAL DRAINAGE CATHETER PLACEMENT  Comparison: CT abdomen pelvis - 09/19/2013; 09/07/2013  Medications: Fentanyl 50 mcg IV; Versed 2 mg IV  Total Moderate Sedation time: 20 minutes  Contrast: None  Complications:  None immediate  Technique / Findings:  Informed written consent was obtained from the patient after a discussion of the risks, benefits and alternatives to treatment. The patient was placed supine on the CT gantry and a  pre procedural CT was performed re-demonstrating the known abscess/fluid collection within the midline of the abdomen measuring at least 5.6 x 3.1 cm (image one, series 2). The procedure was planned.   A timeout was performed prior to the initiation of the procedure.  The skin between the left lower quadrant ileostomy and midline open abdominal wound was prepped and draped in the usual sterile fashion.   The overlying soft tissues were anesthetized with 1% lidocaine with epinephrine.  Appropriate trajectory was planned with the use of a 22 gauge spinal needle.  An 18 gauge trocar needle was advanced into the abscess/fluid collection and a short Amplatz super stiff wire was coiled within the collection. Appropriate positioning was confirmed with a limited CT scan.  The tract was serially dilated allowing placement of a 10 [place] all- purpose drainage catheter.  Appropriate positioning was confirmed with a limited postprocedural CT scan.  Approximately 130 ml of purulent fluid was aspirated.  The tube was connected to a drainage bag and sutured in place.  A dressing was placed.  The patient tolerated the procedure well without immediate post procedural complication.  Impression:  Successful CT guided placement of a 10 French all purpose drain catheter into the midline intra abdominal abscess with aspiration of 130 mL of purulent fluid.  Samples were sent to the laboratory as requested by the ordering clinical team.   Original Report Authenticated By: Tacey Ruiz, MD    Microbiology: Recent Results (from the past 240 hour(s))  URINE CULTURE     Status: None   Collection Time    10/09/13  9:22 PM      Result Value Range Status   Specimen Description URINE, CLEAN CATCH   Final   Special  Requests NONE   Final   Culture  Setup Time     Final   Value: 10/10/2013 14:19     Performed at [facility]   Colony Count     Final   Value: NO GROWTH     Performed at [facility]   Culture     Final   Value: NO GROWTH     Performed at [facility]   Report Status 10/11/2013 FINAL   Final  CLOSTRIDIUM DIFFICILE BY PCR     Status: None   Collection Time    10/10/13  6:30 PM      Result Value Range Status   C difficile by pcr NEGATIVE  NEGATIVE Final     Labs: Basic Metabolic Panel:  Recent Labs Lab 10/09/13 1256 10/10/13 0840 10/11/13 0639  NA 128* 132* 135  K 4.7 5.3* 4.2  CL 93* 99 101  CO2 24 23 24   GLUCOSE 118* 134* 103*  BUN 39* 29* 22  CREATININE 2.12* 1.74* 1.57*  CALCIUM 9.4 9.0 9.0   Liver Function Tests:  Recent Labs Lab 10/09/13 1256  AST 14  ALT 10  ALKPHOS 57  BILITOT 0.5  PROT 6.7  ALBUMIN 2.7*    Recent Labs Lab 10/09/13 1256  LIPASE 22   No results found for this basename: AMMONIA,  in the last 168 hours CBC:  Recent Labs Lab 10/09/13 1256 10/10/13 0840 10/11/13 0639  WBC 8.1 6.6 5.7  NEUTROABS 5.8  --   --   HGB 9.2* 8.9* 8.4*  HCT 27.8* 27.4* 26.0*  MCV 95.2 95.8 96.7  PLT 499* 434* 391   Cardiac Enzymes: No results found for this basename: CKTOTAL, CKMB, CKMBINDEX, TROPONINI,  in the  last 168 hours BNP: BNP (last 3 results)  Recent Labs  10/09/13 1256  PROBNP 827.5*   CBG: No results found for this basename: GLUCAP,  in the last 168 hours     Signed:  Terron Merfeld  [facility] 10/11/2013, 12:52 PM

## 2013-10-11 NOTE — Progress Notes (Signed)
Pt and his wife verbalize understanding of d/c instructions, care of incision & drains, medications and follow up appts. No questions at this time. PICC line was flushed and blood return noted. Pt d/c via wheelchair by NT with his wife. HH was contacted and will be resumed.  Sheryn Bison

## 2013-10-20 ENCOUNTER — Telehealth (INDEPENDENT_AMBULATORY_CARE_PROVIDER_SITE_OTHER): Payer: Self-pay | Admitting: General Surgery

## 2013-10-20 NOTE — Telephone Encounter (Signed)
Message copied by Liliana Cline on Mon Oct 20, 2013  9:37 AM ------      Message from: [name], ERIC M      Created: Fri Oct 17, 2013  6:36 PM      Regarding: Postop       I need to see this guy. Complex DOW pt. I actually thought he had died. Was tx to [facility]. Has multiple perc drains. Ho heart transplant came in with perf diverticulitis- got sigmoid resection with ostomy.             Needs to have wound looked at, ostomy checked, and drains evaluated.             Has [facility] nursing.             Pls call pt and see how doing. They need to be keeping individual drain journal and pushing food- ensure/boost.            Oh BTW he has active oral squamous cell Cancer that he was getting txt for before perforation.            I guess put him on for Tuesday am- I can start sooner. But we can't put anyother pts on for that morning.            If he is having issues and needs to be seen this week, I guess urgent office or look at my op note and see if there was an assist who could eyeball him            Thanks and sorry             I signed off on his [name] records Friday pm - there are mixed in with other papers I left in pod 3            Wilson ------

## 2013-10-20 NOTE — Telephone Encounter (Signed)
Called and spoke with patient's wife. Patient is doing okay. He is having some nausea in the mornings. His incision looks good. It is closing. He only has one drain left - others removed by [name] - last drain is draining so little they aren't even measuring it. He is eating but not much. He is mainly eating yogurt and drinking at least two boosts a day. Appt made for him to be seen Wednesday to evalutate for drain removal.

## 2013-10-22 ENCOUNTER — Encounter (INDEPENDENT_AMBULATORY_CARE_PROVIDER_SITE_OTHER): Payer: Self-pay | Admitting: Surgery

## 2013-10-22 ENCOUNTER — Encounter (HOSPITAL_COMMUNITY): Payer: Self-pay | Admitting: Emergency Medicine

## 2013-10-22 ENCOUNTER — Encounter (INDEPENDENT_AMBULATORY_CARE_PROVIDER_SITE_OTHER): Payer: Self-pay

## 2013-10-22 ENCOUNTER — Inpatient Hospital Stay (HOSPITAL_COMMUNITY)
Admission: EM | Admit: 2013-10-22 | Discharge: 2013-10-24 | DRG: 299 | Disposition: A | Discharge status: Home / Self Care | Payer: Medicare Other | Attending: Internal Medicine | Admitting: Internal Medicine

## 2013-10-22 ENCOUNTER — Ambulatory Visit (INDEPENDENT_AMBULATORY_CARE_PROVIDER_SITE_OTHER): Payer: Medicare Other | Admitting: Surgery

## 2013-10-22 ENCOUNTER — Ambulatory Visit (HOSPITAL_COMMUNITY)
Admission: RE | Admit: 2013-10-22 | Discharge: 2013-10-22 | Disposition: A | Discharge status: Home / Self Care | Payer: Medicare Other | Source: Ambulatory Visit | Attending: Surgery | Admitting: Surgery

## 2013-10-22 VITALS — BP 124/68 | HR 72 | Resp 16 | Ht 72.0 in | Wt 155.4 lb

## 2013-10-22 DIAGNOSIS — R600 Localized edema: Secondary | ICD-10-CM

## 2013-10-22 DIAGNOSIS — C760 Malignant neoplasm of head, face and neck: Secondary | ICD-10-CM

## 2013-10-22 DIAGNOSIS — Z9889 Other specified postprocedural states: Secondary | ICD-10-CM

## 2013-10-22 DIAGNOSIS — Z85819 Personal history of malignant neoplasm of unspecified site of lip, oral cavity, and pharynx: Secondary | ICD-10-CM

## 2013-10-22 DIAGNOSIS — B370 Candidal stomatitis: Secondary | ICD-10-CM | POA: Diagnosis present

## 2013-10-22 DIAGNOSIS — Z79899 Other long term (current) drug therapy: Secondary | ICD-10-CM

## 2013-10-22 DIAGNOSIS — Z980 Intestinal bypass and anastomosis status: Secondary | ICD-10-CM

## 2013-10-22 DIAGNOSIS — Z9049 Acquired absence of other specified parts of digestive tract: Secondary | ICD-10-CM

## 2013-10-22 DIAGNOSIS — E43 Unspecified severe protein-calorie malnutrition: Secondary | ICD-10-CM | POA: Diagnosis present

## 2013-10-22 DIAGNOSIS — Z933 Colostomy status: Secondary | ICD-10-CM

## 2013-10-22 DIAGNOSIS — K5780 Diverticulitis of intestine, part unspecified, with perforation and abscess without bleeding: Secondary | ICD-10-CM

## 2013-10-22 DIAGNOSIS — Z09 Encounter for follow-up examination after completed treatment for conditions other than malignant neoplasm: Secondary | ICD-10-CM

## 2013-10-22 DIAGNOSIS — I129 Hypertensive chronic kidney disease with stage 1 through stage 4 chronic kidney disease, or unspecified chronic kidney disease: Secondary | ICD-10-CM | POA: Diagnosis present

## 2013-10-22 DIAGNOSIS — Z8589 Personal history of malignant neoplasm of other organs and systems: Secondary | ICD-10-CM

## 2013-10-22 DIAGNOSIS — IMO0002 Reserved for concepts with insufficient information to code with codable children: Secondary | ICD-10-CM

## 2013-10-22 DIAGNOSIS — I82A19 Acute embolism and thrombosis of unspecified axillary vein: Secondary | ICD-10-CM | POA: Diagnosis present

## 2013-10-22 DIAGNOSIS — I82409 Acute embolism and thrombosis of unspecified deep veins of unspecified lower extremity: Principal | ICD-10-CM | POA: Diagnosis present

## 2013-10-22 DIAGNOSIS — Z941 Heart transplant status: Secondary | ICD-10-CM

## 2013-10-22 DIAGNOSIS — I82401 Acute embolism and thrombosis of unspecified deep veins of right lower extremity: Secondary | ICD-10-CM

## 2013-10-22 DIAGNOSIS — R609 Edema, unspecified: Secondary | ICD-10-CM

## 2013-10-22 DIAGNOSIS — I10 Essential (primary) hypertension: Secondary | ICD-10-CM | POA: Diagnosis present

## 2013-10-22 DIAGNOSIS — F17200 Nicotine dependence, unspecified, uncomplicated: Secondary | ICD-10-CM | POA: Diagnosis present

## 2013-10-22 DIAGNOSIS — E860 Dehydration: Secondary | ICD-10-CM | POA: Diagnosis present

## 2013-10-22 DIAGNOSIS — I5032 Chronic diastolic (congestive) heart failure: Secondary | ICD-10-CM

## 2013-10-22 DIAGNOSIS — N183 Chronic kidney disease, stage 3 unspecified: Secondary | ICD-10-CM | POA: Diagnosis present

## 2013-10-22 LAB — BASIC METABOLIC PANEL
CO2: 27 mEq/L (ref 19–32)
Calcium: 9.2 mg/dL (ref 8.4–10.5)
Chloride: 94 mEq/L — ABNORMAL LOW (ref 96–112)
Creatinine, Ser: 1.36 mg/dL — ABNORMAL HIGH (ref 0.50–1.35)
Glucose, Bld: 119 mg/dL — ABNORMAL HIGH (ref 70–99)
Potassium: 4.5 mEq/L (ref 3.5–5.1)
Sodium: 132 mEq/L — ABNORMAL LOW (ref 135–145)

## 2013-10-22 LAB — CBC WITH DIFFERENTIAL/PLATELET
Eosinophils Relative: 0 % (ref 0–5)
Hemoglobin: 10 g/dL — ABNORMAL LOW (ref 13.0–17.0)
Lymphocytes Relative: 13 % (ref 12–46)
Lymphs Abs: 0.8 10*3/uL (ref 0.7–4.0)
MCH: 31.3 pg (ref 26.0–34.0)
MCV: 95.3 fL (ref 78.0–100.0)
Monocytes Absolute: 0.7 10*3/uL (ref 0.1–1.0)
Monocytes Relative: 12 % (ref 3–12)
Platelets: 266 10*3/uL (ref 150–400)
RBC: 3.19 MIL/uL — ABNORMAL LOW (ref 4.22–5.81)
RDW: 15 % (ref 11.5–15.5)
WBC: 6.1 10*3/uL (ref 4.0–10.5)

## 2013-10-22 MED ORDER — ONDANSETRON HCL 4 MG/2ML IJ SOLN
4.0000 mg | Freq: Four times a day (QID) | INTRAMUSCULAR | Status: DC | PRN
  Administered 2013-10-23: 4 mg via INTRAVENOUS
  Filled 2013-10-22: qty 2

## 2013-10-22 MED ORDER — FAMOTIDINE 20 MG PO TABS
20.0000 mg | ORAL_TABLET | Freq: Every day | ORAL | Status: DC
  Administered 2013-10-23 (×2): 20 mg via ORAL
  Filled 2013-10-22 (×2): qty 1

## 2013-10-22 MED ORDER — CARVEDILOL 12.5 MG PO TABS
25.0000 mg | ORAL_TABLET | Freq: Two times a day (BID) | ORAL | Status: DC
  Administered 2013-10-23 – 2013-10-24 (×4): 25 mg via ORAL
  Filled 2013-10-22 (×4): qty 2

## 2013-10-22 MED ORDER — OXYCODONE HCL 5 MG PO TABS
10.0000 mg | ORAL_TABLET | Freq: Four times a day (QID) | ORAL | Status: DC | PRN
  Administered 2013-10-23 – 2013-10-24 (×5): 10 mg via ORAL
  Filled 2013-10-22 (×5): qty 2

## 2013-10-22 MED ORDER — SODIUM CHLORIDE 0.9 % IJ SOLN: 3.0000 mL | Freq: Two times a day (BID) | INTRAMUSCULAR | Status: DC

## 2013-10-22 MED ORDER — PREDNISONE 10 MG PO TABS
5.0000 mg | ORAL_TABLET | Freq: Every day | ORAL | Status: DC
Start: 2013-10-23 — End: 2013-10-24
  Administered 2013-10-23 – 2013-10-24 (×2): 5 mg via ORAL
  Filled 2013-10-22 (×2): qty 1

## 2013-10-22 MED ORDER — SODIUM CHLORIDE 0.9 % IV SOLN
INTRAVENOUS | Status: DC
  Administered 2013-10-23: via INTRAVENOUS

## 2013-10-22 MED ORDER — ENOXAPARIN SODIUM 80 MG/0.8ML ~~LOC~~ SOLN
1.0000 mg/kg | Freq: Once | SUBCUTANEOUS | Status: AC
  Administered 2013-10-22: 70 mg via SUBCUTANEOUS
  Filled 2013-10-22: qty 0.8

## 2013-10-22 MED ORDER — PROMETHAZINE HCL 25 MG PO TABS: 25.0000 mg | ORAL_TABLET | Freq: Four times a day (QID) | ORAL | Status: DC | PRN

## 2013-10-22 MED ORDER — ONDANSETRON HCL 4 MG PO TABS: 4.0000 mg | ORAL_TABLET | Freq: Four times a day (QID) | ORAL | Status: DC | PRN

## 2013-10-22 MED ORDER — ACETAMINOPHEN 325 MG PO TABS: 650.0000 mg | ORAL_TABLET | Freq: Four times a day (QID) | ORAL | Status: DC | PRN

## 2013-10-22 MED ORDER — SODIUM CHLORIDE 0.9 % IV BOLUS (SEPSIS): 1000.0000 mL | Freq: Once | INTRAVENOUS | Status: AC

## 2013-10-22 MED ORDER — HYDROMORPHONE HCL PF 1 MG/ML IJ SOLN
0.5000 mg | Freq: Once | INTRAMUSCULAR | Status: AC
  Administered 2013-10-22: 0.5 mg via INTRAVENOUS
  Filled 2013-10-22: qty 1

## 2013-10-22 MED ORDER — PROMETHAZINE HCL 25 MG/ML IJ SOLN
12.5000 mg | Freq: Once | INTRAMUSCULAR | Status: AC
  Administered 2013-10-22: 12.5 mg via INTRAVENOUS
  Filled 2013-10-22: qty 1

## 2013-10-22 MED ORDER — NYSTATIN 100000 UNIT/ML MT SUSP
5.0000 mL | Freq: Four times a day (QID) | OROMUCOSAL | Status: DC
  Administered 2013-10-23 – 2013-10-24 (×8): 500000 [IU] via ORAL
  Filled 2013-10-22 (×8): qty 5

## 2013-10-22 MED ORDER — ACETAMINOPHEN 650 MG RE SUPP: 650.0000 mg | Freq: Four times a day (QID) | RECTAL | Status: DC | PRN

## 2013-10-22 MED ORDER — DEXTROSE 5 % IV SOLN
1.0000 g | INTRAVENOUS | Status: DC
  Administered 2013-10-23 – 2013-10-24 (×2): 1 g via INTRAVENOUS
  Filled 2013-10-22 (×5): qty 10

## 2013-10-22 MED ORDER — SODIUM CHLORIDE 0.9 % IV BOLUS (SEPSIS)
1000.0000 mL | Freq: Once | INTRAVENOUS | Status: AC
  Administered 2013-10-22: 1000 mL via INTRAVENOUS

## 2013-10-22 MED ORDER — METRONIDAZOLE 500 MG PO TABS
500.0000 mg | ORAL_TABLET | Freq: Three times a day (TID) | ORAL | Status: DC
  Administered 2013-10-23 – 2013-10-24 (×6): 500 mg via ORAL
  Filled 2013-10-22 (×7): qty 1

## 2013-10-22 MED ORDER — FLUCONAZOLE 100 MG PO TABS
100.0000 mg | ORAL_TABLET | Freq: Every day | ORAL | Status: DC
  Administered 2013-10-23 (×2): 100 mg via ORAL
  Filled 2013-10-22 (×2): qty 1

## 2013-10-22 MED ORDER — FLUCONAZOLE 200 MG PO TABS: 200.0000 mg | ORAL_TABLET | Freq: Every day | ORAL | Status: DC

## 2013-10-22 MED ORDER — TACROLIMUS 1 MG PO CAPS
3.0000 mg | ORAL_CAPSULE | Freq: Two times a day (BID) | ORAL | Status: DC
  Administered 2013-10-23 – 2013-10-24 (×4): 3 mg via ORAL
  Filled 2013-10-22 (×10): qty 3

## 2013-10-22 MED ORDER — DEXTROSE 5 % IV SOLN
INTRAVENOUS | Status: AC
  Filled 2013-10-22: qty 10

## 2013-10-22 MED ORDER — TACROLIMUS 1 MG PO CAPS
ORAL_CAPSULE | ORAL | Status: AC
  Filled 2013-10-22: qty 3

## 2013-10-22 NOTE — Progress Notes (Signed)
This patient comes in for a postoperative followup, the appointment being made to have a drain removed.In talking with the patient and his family he apparently underwent laparoscopic sigmoid colectomy and Hartmann for perforated diverticulitis in September by Dr. Gaynelle Adu. He developed at least one postoperative abscess requiring a percutaneous drain. He then apparently was transferred to [facility] where additional drains were placed. He's been on continuous antibiotics and has a PICC line currently. Most of his drains have been removed he has one left and was told that this should be taken out when he quits draining. It is really draining only 1 or 2 cc per day for the last 3 or 4 days and essentially none overnight. He is not having any abdominal pain, fevers or chills.  He does have multiple complaints however. He notes that when he wakes up in the morning he feels fine, but shortly thereafter begins to develop a little bit of nausea which gets worse during the day. He is not taking very much in. He occasionally has some "dry heaves" but isn't really vomiting up food or intestinal contents. His colostomy continues to work. After his discharge from [facility] he was readmitted to [facility] for dehydration and was placed on IV fluids. He was discharged 4 days ago. He still not taking much in orally. He notes that food doesn't taste good and he has a poor appetite. He feels tired all the time and hasn't made any progress that he can tell. He is on nystatin swish and swallow apparently for yeast in his mouth. His parents, who accompanied him today, note is developed over the last few days some swelling of his ankles. It is not painful however.  He notes he had some labs done on Monday, and these were done by the home health care team. He does not know where the results from them are to be sent. He thinks they were ordered by his physicians at [facility].  Past history etc. Are noted in the electronic medical record  and reviewed, not dictated into this node.  Exam: Vital signs: BP 124/68  Pulse 72  Resp 16  Ht 6' (1.829 m)  Wt 155 lb 6.4 oz (70.489 kg)  BMI 21.07 kg/m2 General: The patient is alert oriented, does not appear toxic or ill. He is appears chronically malnourished and is somewhat pale. Mouth: His tongue is coated with a whitish material, consistent with thrush. Lungs: Normal respirations Abdomen: Soft and benign. Is not distended or tender. The midline incision is nearly healed, with the exception of a few areas of hypertrophic granulation which I touched with silver nitrate. His colostomy is in place in the left lower quadrant and there is stool in the bag. Extremities: He has bilateral pedal edema, there is no evidence of cellulitis. Drain: There is a drain in place just to the left of the midline. The reservoir has essentially no material in it.  Impression: 1. Drain no longer functioning, should be removed 2. Probable oral thrush, inadequately treated with oral nystatin 3. Nausea, persistent and may be related to this thrush, or just  From his overall abdominal situation. Apparently not relieved with Zofran 4. New development of bilateral ankle edema  Plan: 1. I removed the drain without difficulty and applied clean dressing 2. Will put him on some Diflucan to see if this helps with the thrush 3. We'll stop the Zofran and try Phenergan to see if this helps with the nausea. 4. We will send him  for bilateral venous Doppler exams today. I am suspicious he may have DVT. 5. If he does not improve his oral intake he may need to be readmitted for some IV fluids.  Unfortunately, there seemed to be multiple physicians managing this gentleman. It is not clear where the lines of responsibility are. Nevertheless, his first surgery was done by one of [place] so we will continue to work with him. The family is going to contact [name] regarding how long he should be on antibiotics. If indeed he has a DVT  I think he'll need to be admitted, either here, in [place], or at [facility].

## 2013-10-22 NOTE — ED Notes (Signed)
Pt arrives to ED after having doppler US at [facility] which found a DVT in right leg. Pt recently had sx to remove portion of his colon and was visiting Dr. Jamey Ripa in [place] for a routine follow-up. Dr. Jamey Ripa referred him to [facility] for the doppler US due to swelling in right leg. Pt denies pain in right leg. Pt reports back pain that radiates to chest which states has been present since his sx. Pt also reports lack of appetite and constant nausea. [name] mouth is dry with thrush. Pt denies fevers but does report chills intermittently.

## 2013-10-22 NOTE — Patient Instructions (Signed)
We will try some Difucan to see if this helps with what appears to be thrush in the mouth. We will try phenergan for the nausea to see if it works better than the Zofran. DON'T TAKE BOTH PHENERGAN AND ZOFRAN, ONE OR THE OTHER, NOT BOTH. We will arrange a Doppler study to be sure there are no clots in your legs. You need to check with your physicians at [facility] about your lab studies. If you are unable to keep enogh down by mouth you may ned some home IV fluids, but need to talk to your physicians at [facility] or at [facility] that have been looking after you. Dr [name] can see you back next week if you are not being seen at [facility].

## 2013-10-22 NOTE — H&P (Signed)
[facility] History and Physical  Peter Clarke ZOX:[id number] DOB: 04-07-1946 DOA: 10/22/2013   PCP: [name], MD  Specialists: He is followed by the transplant clinic at [facility]. He's also followed by general surgery and infectious disease at [facility]. He is also followed by general surgery at [facility].  Chief Complaint: Swelling of the legs  HPI: Peter Clarke is a 67 y.o. male with a past medical history of heart transplant in 2003 at [facility] for, what sounds like viral myocarditis, history of throat cancer, status post surgery in August of this year and then he is status post Hartman's procedure for perforated diverticulitis in September of this year. Patient has had a long Hospital course in the last couple of months. He's had multiple visits to the physician's. He had drains placed in his abdomen for an abscess that developed postoperatively. His last drain was removed today by general surgery at [facility]. He has also followed up with the heart transplant clinic on Wednesday, and by general surgery, as well as the infectious disease specialist at [facility]. He had a routine visit with the general surgeon at [facility] today, when it was noticed that the patient had lower extremity edema. He was sent over for an ultrasound which revealed acute DVT in the right leg. Patient denies any acute chest pain, per se. He says he has back pain, and chest pain, but that has been ongoing ever since surgery in September. There is no pleuritic component to this pain. He has nausea, but no vomiting. He has abdominal pain from surgery, which is 3-4 of 10, but this is chronic as well. He has a colostomy, which has had stool output without any problems. Denies any dizziness or syncopal episodes. Denies any weakness that is more than usual. Overall, he seem to be progressing quite well.  Home Medications: Prior to Admission medications   Medication Sig Start Date End Date Taking?  Authorizing Provider  carvedilol (COREG) 25 MG tablet Take 25 mg by mouth 2 (two) times daily with a meal.   Yes Historical Provider, MD  dextrose 5 % SOLN 50 mL with cefTRIAXone 1 G SOLR 1 g Inject 1 g into the vein daily.   Yes Historical Provider, MD  ketoconazole (NIZORAL) 2 % cream Apply 1 application topically 2 (two) times daily.   Yes Historical Provider, MD  metroNIDAZOLE (FLAGYL) 500 MG tablet Take 500 mg by mouth 3 (three) times daily. 6 days left   Yes Historical Provider, MD  Multiple Vitamins-Minerals (CENTRUM SILVER PO) Take 1 tablet by mouth daily.    Yes Historical Provider, MD  nystatin (MYCOSTATIN) 100000 UNIT/ML suspension Take 5 mLs (500,000 Units total) by mouth 4 (four) times daily. 09/22/13  Yes Belkys A Regalado, MD  ondansetron (ZOFRAN) 4 MG tablet Take 4 mg by mouth every 8 (eight) hours as needed for nausea.   Yes Historical Provider, MD  Oxycodone HCl 10 MG TABS Take 10 mg by mouth every 6 (six) hours as needed (pain).   Yes Historical Provider, MD  predniSONE (DELTASONE) 5 MG tablet Take 5 mg by mouth daily.  05/13/13  Yes Historical Provider, MD  ranitidine (ZANTAC) 150 MG tablet Take 150 mg by mouth 2 (two) times daily.   Yes Historical Provider, MD  tacrolimus (PROGRAF) 1 MG capsule Take 3 mg by mouth 2 (two) times daily.   Yes Historical Provider, MD  fluconazole (DIFLUCAN) 200 MG tablet Take 1 tablet (200 mg total) by mouth daily. 10/22/13  Currie Paris, MD  promethazine (PHENERGAN) 25 MG tablet Take 1 tablet (25 mg total) by mouth every 6 (six) hours as needed for nausea or vomiting. 10/22/13   Currie Paris, MD    Allergies:  Allergies  Allergen Reactions  . Pepto-Bismol [Bismuth Subsalicylate]     rash    Past Medical History: Past Medical History  Diagnosis Date  . Hypertension   . H/O heart transplant 2003  . Wears glasses   . Wears dentures     upper  . Renal insufficiency   . Cancer     tonsil  . Throat cancer     Past Surgical  History  Procedure Laterality Date  . Heart transplant N/A 2003    [facility]-  . Appendectomy    . Colonoscopy    . Hernia repair  2008    rt ing  . Cardiac catheterization  2011  . Tonsillectomy Left 07/07/2013    Procedure: TONSILLECTOMY;  Surgeon: Darletta Moll, MD;  Location: [facility];  Service: ENT;  Laterality: Left;  . Direct laryngoscopy N/A 07/07/2013    Procedure: DIRECT LARYNGOSCOPY WITH BIOPSY;  Surgeon: Darletta Moll, MD;  Location: [facility];  Service: ENT;  Laterality: N/A;  . Colon resection N/A 09/09/2013    Procedure: Lap. Assisted Colectomy, lysis of adhesions, Colostomy;  Surgeon: Atilano Ina, MD;  Location: [facility] OR;  Service: General;  Laterality: N/A;    Social History: He lives in [place] with his wife. He's independent with daily activities. Denies any smoking alcohol use or illicit drug use.  Family History:  Family History  Problem Relation Age of Onset  . Cancer Mother     certain cancer unknown to pt     Review of Systems - History obtained from the patient General ROS: positive for  - fatigue Psychological ROS: negative Ophthalmic ROS: negative ENT ROS: negative Allergy and Immunology ROS: negative Hematological and Lymphatic ROS: negative Endocrine ROS: negative Respiratory ROS: no cough, shortness of breath, or wheezing Cardiovascular ROS: no chest pain or dyspnea on exertion Gastrointestinal ROS: chronic abdo pain due to surgery Genito-Urinary ROS: no dysuria, trouble voiding, or hematuria Musculoskeletal ROS: as in hpi Neurological ROS: no TIA or stroke symptoms Dermatological ROS: negative  Physical Examination  Filed Vitals:   10/22/13 1824 10/22/13 2102  BP: 124/86 135/84  Pulse: 105 101  Temp: 98.8 F (37.1 C) 98.4 F (36.9 C)  TempSrc: Oral Oral  Resp: 16 20  Height: 6' (1.829 m)   Weight: 70.308 kg (155 lb)   SpO2: 98% 100%    General appearance: alert, cooperative, appears stated age and no  distress Head: Normocephalic, without obvious abnormality, atraumatic Eyes: conjunctivae/corneas clear. PERRL, EOM's intact. Throat: thrush is noted back of throat Resp: clear to auscultation bilaterally Cardio: regular rate and rhythm, S1, S2 normal, no murmur, click, rub or gallop GI: Scars from recent surgery is noted. Colostomy bag is noted with brown stool. Abdomen is slightly tender to palpation without any rebound, rigidity, or guarding. Bowel sounds are present. No active inflammation or erythema is noted. Extremities: Edema is noted in lower extremities. Pulses: 2+ and symmetric Skin: Skin color, texture, turgor normal. No rashes or lesions Lymph nodes: Cervical, supraclavicular, and axillary nodes normal. Neurologic: He is alert and oriented x3. No focal neurological deficits are present.  Laboratory Data: Results for orders placed during the hospital encounter of 10/22/13 (from the past 48 hour(s))  BASIC METABOLIC PANEL  Status: Abnormal   Collection Time    10/22/13  8:11 PM      Result Value Range   Sodium 132 (*) 135 - 145 mEq/L   Potassium 4.5  3.5 - 5.1 mEq/L   Chloride 94 (*) 96 - 112 mEq/L   CO2 27  19 - 32 mEq/L   Glucose, Bld 119 (*) 70 - 99 mg/dL   BUN 19  6 - 23 mg/dL   Creatinine, Ser 1.61 (*) 0.50 - 1.35 mg/dL   Calcium 9.2  8.4 - 09.6 mg/dL   GFR calc non Af Amer 52 (*) >90 mL/min   GFR calc Af Amer 61 (*) >90 mL/min   Comment: (NOTE)     The eGFR has been calculated using the CKD EPI equation.     This calculation has not been validated in all clinical situations.     eGFR's persistently <90 mL/min signify possible Chronic Kidney     Disease.  CBC WITH DIFFERENTIAL     Status: Abnormal   Collection Time    10/22/13  8:11 PM      Result Value Range   WBC 6.1  4.0 - 10.5 K/uL   RBC 3.19 (*) 4.22 - 5.81 MIL/uL   Hemoglobin 10.0 (*) 13.0 - 17.0 g/dL   HCT 04.5 (*) 40.9 - 81.1 %   MCV 95.3  78.0 - 100.0 fL   MCH 31.3  26.0 - 34.0 pg   MCHC 32.9   30.0 - 36.0 g/dL   RDW 91.4  78.2 - 95.6 %   Platelets 266  150 - 400 K/uL   Neutrophils Relative % 75  43 - 77 %   Neutro Abs 4.5  1.7 - 7.7 K/uL   Lymphocytes Relative 13  12 - 46 %   Lymphs Abs 0.8  0.7 - 4.0 K/uL   Monocytes Relative 12  3 - 12 %   Monocytes Absolute 0.7  0.1 - 1.0 K/uL   Eosinophils Relative 0  0 - 5 %   Eosinophils Absolute 0.0  0.0 - 0.7 K/uL   Basophils Relative 0  0 - 1 %   Basophils Absolute 0.0  0.0 - 0.1 K/uL    Radiology Reports: No results found.  Electrocardiogram: None today  Problem List  Principal Problem:   Acute DVT (deep venous thrombosis) Active Problems:   History of heart transplant/chronic immunosupression   Head and neck cancer   HTN (hypertension)   CKD (chronic kidney disease) stage 3, GFR 30-59 ml/min   Status post Hartmann's procedure   Assessment: This is a 67 year old, Caucasian male, who presents after being diagnosed with an acute DVT in the right leg. Due to his complicated medical history he merits observation in the hospital overnight. He has been given Lovenox, which will be continued for now. His DVT is likely a result of his recent hospitalization.  Plan: #1 acute DVT in the right lower extremity: Continue with Lovenox for now. Ideally we would like to discuss his new diagnosis with his heart transplant team, but this can be done in the morning. This will help determine how to further treat him in view of his transplant and use of immunosuppressants. It may also be beneficial to discuss this with his surgeon, but that is not as necessary. He does have chronic kidney disease which will need to be taken into consideration if Rivaroxaban is considered.   #2 recent GI surgery for perforated diverticulitis: He is status post Hartman's procedure. He  is stable. Colostomy seems to be functioning. Scars did not show any active infection. He, however, is still on antibiotics for an abscess that was detected ostoperatively. Continue  with ceftriaxone intravenously and Flagyl orally for now. Is supposed to take these for one more week. This is being managed by infectious disease at [facility].  #3 oral thrush: Continue with nystatin and Diflucan.  #4 history of heart transplant in 2003: He is stable from a cardiac standpoint. As discussed above management of DVT may need to be discussed with his transplant team in the morning. The name of the coordinator is Lattie Corns, phone number [phone]. The name of the physician is Dr. Aundria Rud. Continue with his immunosuppressants. Continue with his beta blockers.  #5 history of CKD stage III: Creatinine, appears to be stable. It's actually better compared to previous values. Continue to monitor for now.  #6 history of recent throat cancer: Stable. Can follow up with Dr. Suszanne Conners.   DVT Prophylaxis: Lovenox full dose per pharmacy Code Status: Full code Family Communication: Discussed with the patient and his wife  Disposition Plan: Observe on telemetry.   Further management decisions will depend on results of further testing and patient's response to treatment.  [facility]  [facility] Pager [phone]  If 7PM-7AM, please contact night-coverage [email or web address] Password [email or web address]  10/22/2013, 10:19 PM

## 2013-10-22 NOTE — Progress Notes (Signed)
VASCULAR LAB PRELIMINARY  PRELIMINARY  PRELIMINARY  PRELIMINARY  Bilateral lower extremity venous duplex completed.    Preliminary report:  Right leg is positive for acute DVT throughout the entire gastrocnemius veins located in the distal popliteal and proximal calf regions. Left leg is negative for deep and superficial vein thrombosis. Report called to Dr. Cyndia Bent.  He instructed patient to go to ED of his choice, [facility]) for evaluation and treatment of acute DVT.  Verlon Au RN at [facility] ED called at 5:45 pm and told of patient's anticipated arrival.  Preliminary report sent with patient.  Wyn Nettle, RVT 10/22/2013, 5:51 PM

## 2013-10-22 NOTE — ED Provider Notes (Signed)
CSN: [id number]     Arrival date & time 10/22/13  1816 History   First MD Initiated Contact with Patient 10/22/13 1829     Chief Complaint  Patient presents with  . DVT   (Consider location/radiation/quality/duration/timing/severity/associated sxs/prior Treatment) HPI..... patient diagnosed today with a DVT in right popliteal area. Additionally he feels very dehydrated. Recently patient has been treated for both throat cancer and a perforated diverticulum which resulted in surgery and ultimately a colostomy bag. He has required admission to [facility] for abdominal abscesses status post surgery. He has not been eating or drinking much.  He had a heart transplant in 2003  Past Medical History  Diagnosis Date  . Hypertension   . H/O heart transplant 2003  . Wears glasses   . Wears dentures     upper  . Renal insufficiency   . Cancer     tonsil  . Throat cancer    Past Surgical History  Procedure Laterality Date  . Heart transplant N/A 2003    [facility]-  . Appendectomy    . Colonoscopy    . Hernia repair  2008    rt ing  . Cardiac catheterization  2011  . Tonsillectomy Left 07/07/2013    Procedure: TONSILLECTOMY;  Surgeon: Darletta Moll, MD;  Location: [facility];  Service: ENT;  Laterality: Left;  . Direct laryngoscopy N/A 07/07/2013    Procedure: DIRECT LARYNGOSCOPY WITH BIOPSY;  Surgeon: Darletta Moll, MD;  Location: [facility];  Service: ENT;  Laterality: N/A;  . Colon resection N/A 09/09/2013    Procedure: Lap. Assisted Colectomy, lysis of adhesions, Colostomy;  Surgeon: Atilano Ina, MD;  Location: [facility];  Service: General;  Laterality: N/A;   Family History  Problem Relation Age of Onset  . Cancer Mother     certain cancer unknown to pt   History  Substance Use Topics  . 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 Smokeless tobacco: Never Used  . Alcohol Use: No    Review of Systems  Unable to  perform ROS: Acuity of condition    Allergies  Pepto-bismol  Home Medications   Current Outpatient Rx  Name  Route  Sig  Dispense  Refill  . carvedilol (COREG) 25 MG tablet   Oral   Take 25 mg by mouth 2 (two) times daily with a meal.         . dextrose 5 % SOLN 50 mL with cefTRIAXone 1 G SOLR 1 g   Intravenous   Inject 1 g into the vein daily.         Marland Kitchen ketoconazole (NIZORAL) 2 % cream   Topical   Apply 1 application topically 2 (two) times daily.         . metroNIDAZOLE (FLAGYL) 500 MG tablet   Oral   Take 500 mg by mouth 3 (three) times daily. 6 days left         . Multiple Vitamins-Minerals (CENTRUM SILVER PO)   Oral   Take 1 tablet by mouth daily.          Marland Kitchen nystatin (MYCOSTATIN) 100000 UNIT/ML suspension   Oral   Take 5 mLs (500,000 Units total) by mouth 4 (four) times daily.   60 mL   0   . ondansetron (ZOFRAN) 4 MG tablet   Oral   Take 4 mg by mouth every 8 (eight) hours as  needed for nausea.         . Oxycodone HCl 10 MG TABS   Oral   Take 10 mg by mouth every 6 (six) hours as needed (pain).         . predniSONE (DELTASONE) 5 MG tablet   Oral   Take 5 mg by mouth daily.          . ranitidine (ZANTAC) 150 MG tablet   Oral   Take 150 mg by mouth 2 (two) times daily.         . tacrolimus (PROGRAF) 1 MG capsule   Oral   Take 3 mg by mouth 2 (two) times daily.         . fluconazole (DIFLUCAN) 200 MG tablet   Oral   Take 1 tablet (200 mg total) by mouth daily.   3 tablet   2   . promethazine (PHENERGAN) 25 MG tablet   Oral   Take 1 tablet (25 mg total) by mouth every 6 (six) hours as needed for nausea or vomiting.   10 tablet   0    BP 135/84  Pulse 101  Temp(Src) 98.4 F (36.9 C) (Oral)  Resp 20  Ht 6' (1.829 m)  Wt 155 lb (70.308 kg)  BMI 21.02 kg/m2  SpO2 100% Physical Exam  Nursing note and vitals reviewed. Constitutional: He is oriented to person, place, and time. He appears well-developed and well-nourished.   HENT:  Head: Normocephalic and atraumatic.  Eyes: Conjunctivae and EOM are normal. Pupils are equal, round, and reactive to light.  Neck: Normal range of motion. Neck supple.  Cardiovascular: Normal rate, regular rhythm and normal heart sounds.   Pulmonary/Chest: Effort normal and breath sounds normal.  Abdominal: Soft. Bowel sounds are normal.  Musculoskeletal:  Tender proximal right calf and popliteal area  Neurological: He is alert and oriented to person, place, and time.  Skin: Skin is warm and dry.  Psychiatric: He has a normal mood and affect.    ED Course  Procedures (including critical care time) Labs Review Labs Reviewed  BASIC METABOLIC PANEL - Abnormal; Notable for the following:    Sodium 132 (*)    Chloride 94 (*)    Glucose, Bld 119 (*)    Creatinine, Ser 1.36 (*)    GFR calc non Af Amer 52 (*)    GFR calc Af Amer 61 (*)    All other components within normal limits  CBC WITH DIFFERENTIAL - Abnormal; Notable for the following:    RBC 3.19 (*)    Hemoglobin 10.0 (*)    HCT 30.4 (*)    All other components within normal limits   Imaging Review No results found.  EKG Interpretation   None       MDM  No diagnosis found. Discussed with Dr. Rito Ehrlich. He will evaluate patient for DVT and dehydration.    Donnetta Hutching, MD 10/22/13 2201

## 2013-10-22 NOTE — ED Notes (Signed)
Pt was sent from [place], for treatment of DVT to right calf area.

## 2013-10-23 ENCOUNTER — Encounter (HOSPITAL_COMMUNITY): Payer: Medicare Other

## 2013-10-23 DIAGNOSIS — I5032 Chronic diastolic (congestive) heart failure: Secondary | ICD-10-CM

## 2013-10-23 DIAGNOSIS — C760 Malignant neoplasm of head, face and neck: Secondary | ICD-10-CM

## 2013-10-23 LAB — BASIC METABOLIC PANEL
BUN: 15 mg/dL (ref 6–23)
CO2: 27 mEq/L (ref 19–32)
Calcium: 9.1 mg/dL (ref 8.4–10.5)
GFR calc non Af Amer: 62 mL/min — ABNORMAL LOW (ref >90)
Glucose, Bld: 87 mg/dL (ref 70–99)
Potassium: 3.8 mEq/L (ref 3.5–5.1)

## 2013-10-23 LAB — CBC
Hemoglobin: 9.7 g/dL — ABNORMAL LOW (ref 13.0–17.0)
MCH: 31.6 pg (ref 26.0–34.0)
MCHC: 33 g/dL (ref 30.0–36.0)
MCV: 95.8 fL (ref 78.0–100.0)
RDW: 15.2 % (ref 11.5–15.5)

## 2013-10-23 LAB — PROTIME-INR: Prothrombin Time: 15.5 seconds — ABNORMAL HIGH (ref 11.6–15.2)

## 2013-10-23 MED ORDER — STERILE WATER FOR INJECTION IJ SOLN
INTRAMUSCULAR | Status: AC
  Filled 2013-10-23: qty 10

## 2013-10-23 MED ORDER — PRO-STAT SUGAR FREE PO LIQD
30.0000 mL | Freq: Three times a day (TID) | ORAL | Status: DC
  Administered 2013-10-23: 30 mL via ORAL
  Filled 2013-10-23 (×3): qty 30

## 2013-10-23 MED ORDER — RIVAROXABAN 20 MG PO TABS: 20.0000 mg | ORAL_TABLET | Freq: Every day | ORAL | Status: DC

## 2013-10-23 MED ORDER — ENOXAPARIN SODIUM 80 MG/0.8ML ~~LOC~~ SOLN
70.0000 mg | Freq: Two times a day (BID) | SUBCUTANEOUS | Status: DC
  Administered 2013-10-23: 08:00:00 via SUBCUTANEOUS
  Filled 2013-10-23: qty 0.8

## 2013-10-23 MED ORDER — SODIUM CHLORIDE 0.9 % IJ SOLN
10.0000 mL | Freq: Two times a day (BID) | INTRAMUSCULAR | Status: DC
  Administered 2013-10-23: 20 mL
  Administered 2013-10-23 – 2013-10-24 (×2): 10 mL

## 2013-10-23 MED ORDER — RIVAROXABAN 15 MG PO TABS
15.0000 mg | ORAL_TABLET | Freq: Two times a day (BID) | ORAL | Status: DC
  Administered 2013-10-23 – 2013-10-24 (×2): 15 mg via ORAL
  Filled 2013-10-23 (×2): qty 1

## 2013-10-23 MED ORDER — SODIUM CHLORIDE 0.9 % IJ SOLN: 10.0000 mL | INTRAMUSCULAR | Status: DC | PRN

## 2013-10-23 MED ORDER — ALTEPLASE 2 MG IJ SOLR
INTRAMUSCULAR | Status: AC
  Filled 2013-10-23: qty 2

## 2013-10-23 MED ORDER — ENOXAPARIN SODIUM 80 MG/0.8ML ~~LOC~~ SOLN: 1.0000 mg/kg | Freq: Two times a day (BID) | SUBCUTANEOUS | Status: DC

## 2013-10-23 NOTE — Progress Notes (Signed)
Discussed case with physician advisor who recommends inpatient status due to s/p heart transplant - relayed information to Dr Jomarie Longs who agreed.  Order for inpatient written

## 2013-10-23 NOTE — Progress Notes (Signed)
INITIAL NUTRITION ASSESSMENT  DOCUMENTATION CODES Per approved criteria  -Severe malnutrition in the context of chronic illness   Pt meets criteria for severe MALNUTRITION in the context of chronic illness as evidenced by energy intake </=75% for >/= 1 month and unintentional wt loss of >5% in 1 month.   INTERVENTION:  Magic Cup with lunch and dinner meals, each supplement provides 290 kcal and 9  grams of protein.  ProStat 30 ml TID (each 30 ml provides 100 kcal, 15 gr protein)  MVI po daily  NUTRITION DIAGNOSIS: Malnutrition (severe) related to inadequate energy intake as evidenced by anorexia and ongoing unplanned wt loss.      Goal: Pt to meet >/= 90% of their estimated nutrition needs   Monitor: Po intake of  meals and supplements, labs and wt trends  Reason for Assessment: Malnutrition screen score= 3  67 y.o. male  Admitting Dx: Acute DVT (deep venous thrombosis)  ASSESSMENT: Pt admitted with DVT. Appetite and po intake continue to be poor. Dehydrated on admission and c/o nausea. Oral thrush and additional wt loss of 4%, 6# x 12 days. Recommend consider alternate nutrition support to meet nutrition needs given pt worsening nutrition status.  10/10/13 Nutr. Hx: His hx is significant for heart transplant, squamous cell cancer (left tonsil). He is s/p colostomy last month at [facility] and abdominal abscess. He was transferred to [facility] from [facility] and was discharged from there last week per spouse.  Pt c/o "non-existent" appetite. The foods that he had previously liked, now he is unable to tolerate the taste (may be mediation related?). He has been taking Marinol for the past week.  If appetite not improved in the next week recommend consider changing to different appetite stimulant. For > month he says po intake has been minimal. He has experienced additonal wt loss of 21#,12% <30 days. Current wt reflects worsening malnutrition. Pt may need to consider alternate nutrition source if he  continues to be unable to meet nutrition requirements orally.  He has been drinking Ensure but says "I'm burn-out" on that. Will try protein modular and Magic Cup and add MVI suspect he is nutritionally deficient.  Patient Active Problem List   Diagnosis Date Noted  . Acute DVT (deep venous thrombosis) 10/22/2013  . Status post Hartmann's procedure 10/22/2013  . Protein-calorie malnutrition, severe 10/10/2013  . AKI (acute kidney injury) 10/09/2013  . Quality of life palliative care encounter 09/16/2013  . Malnutrition of moderate degree 09/12/2013  . Hypomagnesemia 09/12/2013  . Chronic diastolic heart failure 09/08/2013  . Pre-operative cardiovascular examination 09/08/2013  . Fever 09/08/2013  . Leukocytosis 09/08/2013  . CKD (chronic kidney disease) stage 3, GFR 30-59 ml/min 09/08/2013  . HTN (hypertension) 09/07/2013  . Diverticulitis with perforation 09/07/2013  . Head and neck cancer 07/26/2013  . History of heart transplant/chronic immunosupression 06/17/2013   Nutrition Focused Physical Exam:   Subcutaneous Fat:  Orbital Region: moderate loss Upper Arm Region: moderate loss Thoracic and Lumbar Region: not assessed   Muscle:  Temple Region: mild wasting  Clavicle Bone Region: moderate wasting  Clavicle and Acromion Bone Region: mild-moderate wasting  Scapular Bone Region: not assessed Dorsal Hand: moderate-severe wasting  Patellar Region: mild wasting  Anterior Thigh Region: severe wasting  Posterior Calf Region: not assessed  Edema: none   Height: Ht Readings from Last 1 Encounters:  10/22/13 6' (1.829 m)    Weight: Wt Readings from Last 1 Encounters:  10/22/13 151 lb 8 oz (68.72 kg)   Ideal  Body Weight: 178# (80.9 kg)  % Ideal Body Weight: 85 %  Wt Readings from Last 10 Encounters:  10/22/13 151 lb 8 oz (68.72 kg)  10/22/13 155 lb 6.4 oz (70.489 kg)  10/09/13 157 lb 10.1 oz (71.5 kg)  09/18/13 179 lb 3.7 oz (81.3 kg)  09/18/13 179 lb 3.7 oz (81.3  kg)  08/20/13 176 lb (79.833 kg)  07/23/13 178 lb (80.74 kg)  07/07/13 176 lb 6.4 oz (80.015 kg)  07/07/13 176 lb 6.4 oz (80.015 kg)  06/13/13 182 lb (82.555 kg)    Usual Body Weight: 179-182#  % Usual Body Weight: 85%  BMI:  Body mass index is 20.54 kg/(m^2). normal range  Estimated Nutritional Needs: Kcal: 2160-2420 Protein: 94-108 Fluid: 2200 ml/day  Skin: none noted  Diet Order: Cardiac  EDUCATION NEEDS: -Education not appropriate at this time   Intake/Output Summary (Last 24 hours) at 10/23/13 1306 Last data filed at 10/23/13 1100  Gross per 24 hour  Intake      0 ml  Output    150 ml  Net   -150 ml    Last BM:  Colostomy , small,soft, brown-green stool 10/23/13  Labs:   Recent Labs Lab 10/22/13 2011 10/23/13 0552  NA 132* 136  K 4.5 3.8  CL 94* 98  CO2 27 27  BUN 19 15  CREATININE 1.36* 1.18  CALCIUM 9.2 9.1  GLUCOSE 119* 87    CBG (last 3)  No results found for this basename: GLUCAP,  in the last 72 hours  Scheduled Meds: . carvedilol  25 mg Oral BID WC  . cefTRIAXone (ROCEPHIN) 1 g IVPB  1 g Intravenous Q24H  . enoxaparin (LOVENOX) injection  70 mg Subcutaneous Q12H  . famotidine  20 mg Oral QHS  . fluconazole  100 mg Oral Daily  . metroNIDAZOLE  500 mg Oral TID  . nystatin  5 mL Oral QID  . predniSONE  5 mg Oral Daily  . sodium chloride  10-40 mL Intracatheter Q12H  . sodium chloride  3 mL Intravenous Q12H  . tacrolimus  3 mg Oral BID    Continuous Infusions: . sodium chloride 20 mL/hr at 10/23/13 0001    Past Medical History  Diagnosis Date  . Hypertension   . H/O heart transplant 2003  . Wears glasses   . Wears dentures     upper  . Renal insufficiency   . Cancer     tonsil  . Throat cancer     Past Surgical History  Procedure Laterality Date  . Heart transplant N/A 2003    [facility]-  . Appendectomy    . Colonoscopy    . Hernia repair  2008    rt ing  . Cardiac catheterization  2011  . Tonsillectomy Left 07/07/2013     Procedure: TONSILLECTOMY;  Surgeon: Darletta Moll, MD;  Location: [facility];  Service: ENT;  Laterality: Left;  . Direct laryngoscopy N/A 07/07/2013    Procedure: DIRECT LARYNGOSCOPY WITH BIOPSY;  Surgeon: Darletta Moll, MD;  Location: [facility];  Service: ENT;  Laterality: N/A;  . Colon resection N/A 09/09/2013    Procedure: Lap. Assisted Colectomy, lysis of adhesions, Colostomy;  Surgeon: Atilano Ina, MD;  Location: [facility] OR;  Service: General;  Laterality: N/A;    Royann Shivers MS,RD,LDN,CSG Office: [phone] Pager: [phone]

## 2013-10-23 NOTE — Progress Notes (Signed)
ANTICOAGULATION CONSULT NOTE - Initial Consult  Pharmacy Consult for Xarelto Indication: DVT  Allergies  Allergen Reactions  . Pepto-Bismol [Bismuth Subsalicylate]     rash    Patient Measurements: Height: 6' (182.9 cm) Weight: 151 lb 8 oz (68.72 kg) IBW/kg (Calculated) : 77.6  Vital Signs: Temp: 98.1 F (36.7 C) (11/06 0513) Temp src: Oral (11/06 0513) BP: 106/75 mmHg (11/06 0513) Pulse Rate: 100 (11/06 0513)  Labs:  Recent Labs  10/22/13 2011 10/23/13 0552  HGB 10.0* 9.7*  HCT 30.4* 29.4*  PLT 266 309  APTT  --  35  LABPROT  --  15.5*  INR  --  1.26  CREATININE 1.36* 1.18    Estimated Creatinine Clearance: 59 ml/min (by C-G formula based on Cr of 1.18).   Medical History: Past Medical History  Diagnosis Date  . Hypertension   . H/O heart transplant 2003  . Wears glasses   . Wears dentures     upper  . Renal insufficiency   . Cancer     tonsil  . Throat cancer     Medications:  Scheduled:  . carvedilol  25 mg Oral BID WC  . cefTRIAXone (ROCEPHIN) 1 g IVPB  1 g Intravenous Q24H  . famotidine  20 mg Oral QHS  . feeding supplement (PRO-STAT SUGAR FREE 64)  30 mL Oral TID WC  . metroNIDAZOLE  500 mg Oral TID  . nystatin  5 mL Oral QID  . predniSONE  5 mg Oral Daily  . rivaroxaban  15 mg Oral Q12H   Followed by  . [START ON 11/13/2013] rivaroxaban  20 mg Oral Q supper  . sodium chloride  10-40 mL Intracatheter Q12H  . sodium chloride  3 mL Intravenous Q12H  . tacrolimus  3 mg Oral BID    Assessment: 67 yo M with extensive PMH significant for heart transplant in 2003, recent GI surgery, and long-term IV antibiotics for post-op abscess.  He has now developed RLE DVT.  He was started on Lovenox in ED with plans to transition to Xarelto as recommended by patient's transplant team at [facility].   He is noted to have some chronic renal insufficiency with estimated CrCl 50-60 ml/min.   Xarelto is not recommended to given concomitantly with tacrolimus in  patients with renal insufficiency (CrCl<70ml/min) due to increased concentrations of Xarelto/increase risk of bleeding.   No bleeding noted.   Goal of Therapy:  Monitor platelets by anticoagulation protocol: Yes   Plan:  Xarelto 15mg  po bid x 21 days followed by Xarelto 20mg  po daily with food. Monitor CBC Educate patient  Elson Clan 10/23/2013,2:32 PM

## 2013-10-23 NOTE — Progress Notes (Signed)
ANTICOAGULATION CONSULT NOTE - Initial Consult  Pharmacy Consult for Lovenox Indication: DVT  Allergies  Allergen Reactions  . Pepto-Bismol [Bismuth Subsalicylate]     rash    Patient Measurements: Height: 6' (182.9 cm) Weight: 151 lb 8 oz (68.72 kg) IBW/kg (Calculated) : 77.6  Vital Signs: Temp: 98.1 F (36.7 C) (11/06 0513) Temp src: Oral (11/06 0513) BP: 106/75 mmHg (11/06 0513) Pulse Rate: 100 (11/06 0513)  Labs:  Recent Labs  10/22/13 2011 10/23/13 0552  HGB 10.0* 9.7*  HCT 30.4* 29.4*  PLT 266 309  APTT  --  35  LABPROT  --  15.5*  INR  --  1.26  CREATININE 1.36* 1.18    Estimated Creatinine Clearance: 59 ml/min (by C-G formula based on Cr of 1.18).   Medical History: Past Medical History  Diagnosis Date  . Hypertension   . H/O heart transplant 2003  . Wears glasses   . Wears dentures     upper  . Renal insufficiency   . Cancer     tonsil  . Throat cancer     Medications:  Scheduled:  . carvedilol  25 mg Oral BID WC  . cefTRIAXone (ROCEPHIN) 1 g IVPB  1 g Intravenous Q24H  . enoxaparin (LOVENOX) injection  70 mg Subcutaneous Q12H  . famotidine  20 mg Oral QHS  . fluconazole  100 mg Oral Daily  . metroNIDAZOLE  500 mg Oral TID  . nystatin  5 mL Oral QID  . predniSONE  5 mg Oral Daily  . sodium chloride  3 mL Intravenous Q12H  . tacrolimus  3 mg Oral BID    Assessment: 67 yo M with extensive PMH significant for heart transplant in 2003, recent GI surgery, and long-term IV antibiotics for post-op abscess.  He has now developed RLE DVT.   He is noted to have some chronic renal insufficiency with estimated CrCl 50-60 ml/min.   No bleeding noted.  He was started on Lovenox in ED.  Long-term anticoagulation decisions are pending conversation with patient's transplant team at [facility].   Concern for drug interactions with transplant meds and oral anticoagulation includes: Xarelto is not recommended to given concomitantly with tacrolimus in patients  with renal insufficiency due to increased concentrations of Xarelto/increase risk of bleeding.  He patient also currently on fluconazole with can also increase concentrations of Xarelto.  Apixaban interacts with these medications as well.  Warfarin can interact with fluconazole and metronidazole and lead to increased concentrations, however may be a safer option given that INR can be monitored, reversal agent available for warfarin, and medication is temporary (course to be completed in 1 week).    Goal of Therapy:  Anti-Xa level 0.6-1.2 units/ml 4hrs after LMWH dose given Monitor platelets by anticoagulation protocol: Yes   Plan:  Lovenox 70mg  sq q12h Monitor CBC F/U long-term anticoagulation plans  Elson Clan 10/23/2013,7:43 AM

## 2013-10-23 NOTE — Progress Notes (Signed)
[facility] PROGRESS NOTE  Peter Clarke ZOX:[id number] DOB: [birth year] DOA: 10/22/2013 PCP: [name], MD  Assessment/Plan: #1. DVT in the right lower extremity: -transition to xarelto since GFR /CL clearance>30 -d/w Transplant team at [name]   2. Recent Complex admission with Diverticulitis/perforation, s/p Hartmann's procedure at [facility], complicated by abdominal abscesses, he had 1 drain placed at [facility], Cx grew [facility], Then transferred to [facility], then had 3 more drains placed and finally 2 of then removed before discharge and was discharged home with 2 drains and on IV Ceftriaxone and PO Flagyl for 3 weeks on 10/17, had last drain removed yesterday at [facility], per [name] ID will likely need Abx for 1 more week. CT Abd pelvis 10/23 shows resolution of abscesses, will not need drains much longer  -supposed to FU at [facility] ID in 1 week per transplant coordinator  #3 Oral thrush: Continue with nystatin and DC Diflucan, per Transplant coordinator  Fluconazole can increase prograf levels and cause toxicity and hence this was discontinued.   #4 history of heart transplant in 2003:  -He is stable from a cardiac standpoint.  Continue with his immunosuppressants. Continue with his beta blockers.   #5 history of CKD stage III: Creatinine, appears to be improved compared to previous values. Continue to monitor for now.   #6 history of recent throat cancer: Stable. Can follow up with Dr. Suszanne Conners.   #7. Chronic nausea and poor appetite -suspect related to #3 and prolonged illness and PO flagyl -hopefully will not need FLagyl much longer  DVT Prophylaxis: Xarelto  Code Status: Full code  Family Communication: none at bedside Disposition Plan:home tomorrow  HPI/Subjective: Poor appetite, persistent nausea  Objective: Filed Vitals:   10/23/13 0513  BP: 106/75  Pulse: 100  Temp: 98.1 F (36.7 C)  Resp: 20    Intake/Output Summary (Last 24 hours) at 10/23/13 1359 Last data filed at 10/23/13  1100  Gross per 24 hour  Intake      0 ml  Output    150 ml  Net   -150 ml   Filed Weights   10/22/13 1824 10/22/13 2323  Weight: 70.308 kg (155 lb) 68.72 kg (151 lb 8 oz)    Exam:   General:  AAOx3, no distress  Cardiovascular: S1S2/RRR  Respiratory: CTAB  Abdomen: soft, NT, Bs present, colostomy with liquid brown stool  Musculoskeletal: no edema c/c   Data Reviewed: Basic Metabolic Panel:  Recent Labs Lab 10/22/13 2011 10/23/13 0552  NA 132* 136  K 4.5 3.8  CL 94* 98  CO2 27 27  GLUCOSE 119* 87  BUN 19 15  CREATININE 1.36* 1.18  CALCIUM 9.2 9.1   Liver Function Tests: No results found for this basename: AST, ALT, ALKPHOS, BILITOT, PROT, ALBUMIN,  in the last 168 hours No results found for this basename: LIPASE, AMYLASE,  in the last 168 hours No results found for this basename: AMMONIA,  in the last 168 hours CBC:  Recent Labs Lab 10/22/13 2011 10/23/13 0552  WBC 6.1 5.7  NEUTROABS 4.5  --   HGB 10.0* 9.7*  HCT 30.4* 29.4*  MCV 95.3 95.8  PLT 266 309   Cardiac Enzymes: No results found for this basename: CKTOTAL, CKMB, CKMBINDEX, TROPONINI,  in the last 168 hours BNP (last 3 results)  Recent Labs  10/09/13 1256  PROBNP 827.5*   CBG: No results found for this basename: GLUCAP,  in the last 168 hours  No results found for this or any previous visit (  from the past 240 hour(s)).   Studies: No results found.  Scheduled Meds: . carvedilol  25 mg Oral BID WC  . cefTRIAXone (ROCEPHIN) 1 g IVPB  1 g Intravenous Q24H  . enoxaparin (LOVENOX) injection  70 mg Subcutaneous Q12H  . famotidine  20 mg Oral QHS  . feeding supplement (PRO-STAT SUGAR FREE 64)  30 mL Oral TID WC  . fluconazole  100 mg Oral Daily  . metroNIDAZOLE  500 mg Oral TID  . nystatin  5 mL Oral QID  . predniSONE  5 mg Oral Daily  . sodium chloride  10-40 mL Intracatheter Q12H  . sodium chloride  3 mL Intravenous Q12H  . tacrolimus  3 mg Oral BID   Continuous Infusions: .  sodium chloride 20 mL/hr at 10/23/13 0001    Principal Problem:   Acute DVT (deep venous thrombosis) Active Problems:   History of heart transplant/chronic immunosupression   Head and neck cancer   HTN (hypertension)   CKD (chronic kidney disease) stage 3, GFR 30-59 ml/min   Status post Hartmann's procedure    Time spent:    [facility]  [facility] Pager [phone]. If 7PM-7AM, please contact night-coverage at [email or web address], password [facility] 10/23/2013, 1:59 PM  LOS: 1 day

## 2013-10-24 ENCOUNTER — Inpatient Hospital Stay (HOSPITAL_COMMUNITY): Payer: Medicare Other

## 2013-10-24 DIAGNOSIS — K5732 Diverticulitis of large intestine without perforation or abscess without bleeding: Secondary | ICD-10-CM

## 2013-10-24 MED ORDER — RIVAROXABAN 20 MG PO TABS: 20.0000 mg | ORAL_TABLET | Freq: Every day | ORAL | Status: DC

## 2013-10-24 MED ORDER — RIVAROXABAN 15 MG PO TABS: 15.0000 mg | ORAL_TABLET | Freq: Two times a day (BID) | ORAL | Status: DC

## 2013-10-24 MED ORDER — LOPERAMIDE HCL 2 MG PO TABS: 2.0000 mg | ORAL_TABLET | Freq: Two times a day (BID) | ORAL | Status: DC

## 2013-10-24 NOTE — Progress Notes (Signed)
Pt began complaining of sudden, severe pain in right shoulder. Oxycodone given, and Dr. Jomarie Longs notified. I again notified Dr. Jomarie Longs that pt was stating pain medication was ineffective. Order received for Doppler of upper arms. After letting pt rest for a little longer, he stated that pain was now dulled from what it originally was as long as he keeps his arm still. Peter Clarke

## 2013-10-24 NOTE — Progress Notes (Signed)
Pt & his family verbalize understanding of d/c instructions, medications and follow up info. No questions at this time. PICC was removed per order. Pt d/c via wheelchair accompanied by his wife and NT. Sheryn Bison

## 2013-10-24 NOTE — Clinical Documentation Improvement (Signed)
Please clarify nutritional status. Thank you.  Per RD:  Pt meets criteria for severe MALNUTRITION in the context of chronic illness as evidenced by energy intake </=75% for >/= 1 month and unintentional wt loss of >5% in 1 month.  NUTRITION DIAGNOSIS:  Malnutrition (severe) related to inadequate energy intake as evidenced by anorexia and ongoing unplanned wt loss.  Goal:  Pt to meet >/= 90% of their estimated nutrition needs  Monitor:  Po intake of meals and supplements, labs and wt trends ASSESSMENT: Pt admitted with DVT. Appetite and po intake continue to be poor. Dehydrated on admission and c/o nausea. Oral thrush and additional wt loss of 4%, 6# x 12 days. Recommend consider alternate nutrition support to meet nutrition needs given pt worsening nutrition status.  INTERVENTION:  Magic Cup with lunch and dinner meals, each supplement provides 290 kcal and 9 grams of protein.  ProStat 30 ml TID (each 30 ml provides 100 kcal, 15 gr protein) MVI po daily Subcutaneous Fat:  Orbital Region: moderate loss  Upper Arm Region: moderate loss  Thoracic and Lumbar Region: not assessed  Muscle:  Temple Region: mild wasting  Clavicle Bone Region: moderate wasting  Clavicle and Acromion Bone Region: mild-moderate wasting  Scapular Bone Region: not assessed  Dorsal Hand: moderate-severe wasting  Patellar Region: mild wasting  Anterior Thigh Region: severe wasting   Ht. 6'     Wt. 151lbs   BMI  20.54  Possible Clinical Conditions?  Severe Malnutrition   Protein Calorie Malnutrition Severe Protein Calorie Malnutrition Other Condition Cannot clinically determine  Thank You,  Harless Litten ,RN Clinical Documentation Specialist:  [phone]  [facility]- Health Information Management

## 2013-10-24 NOTE — Discharge Summary (Addendum)
Physician Discharge Summary  Peter Clarke ZOX:[id number] DOB: November 29, 1946 DOA: 10/22/2013  PCP: [name], MD  Admit date: 10/22/2013 Discharge date: 10/24/2013  Time spent: 45 minutes  Recommendations for Outpatient Follow-up:  1. Transplant ID clinic thursday  Discharge Diagnoses:  Principal Problem:   Acute DVT (deep venous thrombosis) Active Problems:   History of heart transplant/chronic immunosupression   Head and neck cancer   HTN (hypertension)   CKD (chronic kidney disease) stage 3, GFR 30-59 ml/min   Status post Hartmann's procedure   Severe Protein calorie malnutrition  Discharge Condition: stable  Diet recommendation: low sodium  Filed Weights   10/22/13 1824 10/22/13 2323  Weight: 70.308 kg (155 lb) 68.72 kg (151 lb 8 oz)    History of present illness:  Peter Clarke is a 67 y.o. male with a past medical history of heart transplant in 2003 at [facility] for, what sounds like viral myocarditis, history of throat cancer, status post surgery in August of this year and then he is status post Hartman's procedure for perforated diverticulitis in September of this year. Patient has had a long Hospital course in the last couple of months. He's had multiple visits to the physician's. He had drains placed in his abdomen for an abscess that developed postoperatively. His last drain was removed today by general surgery at [facility]. He has also followed up with the heart transplant clinic on Wednesday, and by general surgery, as well as the infectious disease specialist at [facility]. He had a routine visit with the general surgeon at [facility] today, when it was noticed that the patient had lower extremity edema. He was sent over for an ultrasound which revealed acute DVT in the right leg. Patient denies any acute chest pain, per se. He says he has back pain, and chest pain, but that has been ongoing ever since surgery in September. There is no pleuritic component to  this pain. He has nausea, but no vomiting. He has abdominal pain from surgery, which is 3-4 of 10, but this is chronic as well. He has a colostomy, which has had stool output without any problems. Denies any dizziness or syncopal episodes. Denies any weakness that is more than usual. Overall, he seem to be progressing quite well.   Hospital Course:  #1. DVT in the right lower extremity:  -transitioned to xarelto since GFR /CL clearance>30  -d/w Transplant team at [name]   2. Recent Complex admission with Diverticulitis/perforation, s/p Hartmann's procedure at [facility], complicated by abdominal abscesses, he had 1 drain placed at [facility], Cx grew [facility], Then transferred to [facility], then had 3 more drains placed and finally 2 of then removed before discharge and was discharged home with 2 drains and on IV Ceftriaxone and PO Flagyl for 3 weeks on 10/17, had last drain removed Wednesday 11/5 at [facility], per [name] ID will likely need Abx till Sunday CT Abd pelvis 10/23 shows resolution of abscesses, will not need drains much longer  -since PICC had to be removed, i called and d/w Transplant ID Dr.MAzriacz and she was agreeable with stopping abx  #3 Oral thrush: Continue with nystatin and DC Diflucan, per Transplant coordinator Fluconazole can increase prograf levels and cause toxicity and hence this was discontinued.   #4 history of heart transplant in 2003:  -He is stable from a cardiac standpoint. Continue with his immunosuppressants. Continue with his beta blockers.   #5 history of CKD stage III: Creatinine, appears to be improved compared to previous values. Continue  to monitor for now. Creatinine 1.1 at discharge  #6 history of recent throat cancer: Stable. Can follow up with Dr. Suszanne Conners.   #7. Chronic nausea and poor appetite  -suspect related to #3 and prolonged illness and PO flagyl  -expect this to slowly improve  #8. On the day of discharge complained of pain in RUE, upper part proximal to his piCC, dopplers  revealed R axillary DVT, hence PICC removed, continued on xarelto -d/w transplant ID who was agreeable with stopping Abx 2 days before original stop date  #9. Severe Protein calorie malnutrition -seen by Nutrition, supplements recommended   Procedures:  PICC line removal  Consultations:  Telephone ID Maziarz via telephone  Discharge Exam: Filed Vitals:   10/24/13 0642  BP: 126/82  Pulse: 90  Temp: 98.1 F (36.7 C)  Resp: 20    General: AAOx3 Cardiovascular: S1S2/RRR Respiratory: CTAB  Discharge Instructions  Discharge Orders   Future Appointments Provider Department Dept Phone   11/06/2013 10:15 AM Atilano Ina, MD [facility], [place] [phone]   Future Orders Complete By Expires   Diet - low sodium heart healthy  As directed    Increase activity slowly  As directed        Medication List    STOP taking these medications       dextrose 5 % SOLN 50 mL with cefTRIAXone 1 G SOLR 1 g     fluconazole 200 MG tablet  Commonly known as:  DIFLUCAN     metroNIDAZOLE 500 MG tablet  Commonly known as:  FLAGYL      TAKE these medications       carvedilol 25 MG tablet  Commonly known as:  COREG  Take 25 mg by mouth 2 (two) times daily with a meal.     CENTRUM SILVER PO  Take 1 tablet by mouth daily.     ketoconazole 2 % cream  Commonly known as:  NIZORAL  Apply 1 application topically 2 (two) times daily.     loperamide 2 MG tablet  Commonly known as:  IMODIUM A-D  Take 1 tablet (2 mg total) by mouth 2 (two) times daily with a meal.     nystatin 100000 UNIT/ML suspension  Commonly known as:  MYCOSTATIN  Take 5 mLs (500,000 Units total) by mouth 4 (four) times daily.     ondansetron 4 MG tablet  Commonly known as:  ZOFRAN  Take 4 mg by mouth every 8 (eight) hours as needed for nausea.     Oxycodone HCl 10 MG Tabs  Take 10 mg by mouth every 6 (six) hours as needed (pain).     predniSONE 5 MG tablet  Commonly known as:  DELTASONE  Take 5  mg by mouth daily.     promethazine 25 MG tablet  Commonly known as:  PHENERGAN  Take 1 tablet (25 mg total) by mouth every 6 (six) hours as needed for nausea or vomiting.     ranitidine 150 MG tablet  Commonly known as:  ZANTAC  Take 150 mg by mouth 2 (two) times daily.     Rivaroxaban 15 MG Tabs tablet  Commonly known as:  XARELTO  Take 1 tablet (15 mg total) by mouth every 12 (twelve) hours. For 3 weeks, then start 20mg  daily on 11/27     Rivaroxaban 20 MG Tabs tablet  Commonly known as:  XARELTO  Take 1 tablet (20 mg total) by mouth daily with supper. Starting 11/27  Start taking on:  11/13/2013     tacrolimus 1 MG capsule  Commonly known as:  PROGRAF  Take 3 mg by mouth 2 (two) times daily.       Allergies  Allergen Reactions  . Pepto-Bismol [Bismuth Subsalicylate]     rash       Follow-up Information   Follow up with Transplant ID clinic On 11/06/2013.       The results of significant diagnostics from this hospitalization (including imaging, microbiology, ancillary and laboratory) are listed below for reference.    Significant Diagnostic Studies: Ct Abdomen Pelvis Wo Contrast  10/09/2013   CLINICAL DATA:  Wound infection. Abdomen pain, status post surgery.  EXAM: CT ABDOMEN AND PELVIS WITHOUT CONTRAST  TECHNIQUE: Multidetector CT imaging of the abdomen and pelvis was performed following the standard protocol without intravenous contrast.  COMPARISON:  September 21, 2013, September 19, 2013  FINDINGS: There 2 pigtail drainage tubes in the pelvis and lower abdomen. The previously noted collections in the central lower abdomen and pelvis is almost completely resolved. There is mild residual inflammation and stranding in the mesenteries of the lower abdomen and pelvis.  There are multiple low-density lesions within the liver. The spleen, pancreas adrenal glands are normal. There is probable sludge/stone within the gallbladder. The gallbladder is distended without  pericholecystic inflammation. There is mild bilateral chronic perinephric stranding. No hydronephrosis is noted bilaterally. There is evidence of prior colon surgery. There is no evidence of bowel obstruction. There is atherosclerosis of the abdominal aorta without aneurysmal dilatation.  Fluid-filled bladder is normal. There is left inguinal herniation of mesenteric fat measuring 4.1 cm. There is mild dependent atelectasis of the posterior lung bases. Degenerative joint changes of the bones are noted.  IMPRESSION: There 2 pigtail drainage tubes in the pelvis and lower abdomen. The previously noted collections in the central lower abdomen and pelvis is almost completely resolved. There is mild residual inflammation and stranding in the mesenteries of the lower abdomen and pelvis.   Electronically Signed   By: Sherian Rein M.D.   On: 10/09/2013 17:13   Dg Chest 1 View  10/09/2013   CLINICAL DATA:  Nausea, wound infection  EXAM: CHEST - 1 VIEW  COMPARISON:  None.  FINDINGS: The cardiac shadow is within normal limits. A right-sided PICC line is seen with the tip at the cavoatrial junction. The lungs are well-aerated without focal infiltrate.  IMPRESSION: No acute abnormality noted.   Electronically Signed   By: Alcide Clever M.D.   On: 10/09/2013 16:36   US Venous Img Upper Bilat  10/24/2013   CLINICAL DATA:  Right arm pain  EXAM: BILATERAL UPPER EXTREMITY VENOUS ULTRASOUND  TECHNIQUE: Gray-scale sonography with graded compression, as well as color Doppler and duplex ultrasound were performed to evaluate the upper extremity deep venous system from the level of the subclavian vein and including the jugular, axillary, basilic and upper cephalic vein. Spectral Doppler was utilized to evaluate flow at rest and with distal augmentation maneuvers.  COMPARISON:  None.  FINDINGS: Thrombus within deep veins: Echogenic thrombus is noted within the right axillary vein.  Compressibility of deep veins: Partial  compressibility is noted within the right axillary vein consistent with the known thrombus.  Duplex waveform respiratory phasicity:  Normal.  Duplex waveform response to augmentation:  Normal.  Venous reflux:  None visualized.  Other findings:  None visualized.  IMPRESSION: Right axillary deep venous thrombosis.  No other focal abnormality is noted.   Electronically Signed   By: Loraine Leriche  Lukens M.D.   On: 10/24/2013 13:52    Microbiology: No results found for this or any previous visit (from the past 240 hour(s)).   Labs: Basic Metabolic Panel:  Recent Labs Lab 10/22/13 2011 10/23/13 0552  NA 132* 136  K 4.5 3.8  CL 94* 98  CO2 27 27  GLUCOSE 119* 87  BUN 19 15  CREATININE 1.36* 1.18  CALCIUM 9.2 9.1   Liver Function Tests: No results found for this basename: AST, ALT, ALKPHOS, BILITOT, PROT, ALBUMIN,  in the last 168 hours No results found for this basename: LIPASE, AMYLASE,  in the last 168 hours No results found for this basename: AMMONIA,  in the last 168 hours CBC:  Recent Labs Lab 10/22/13 2011 10/23/13 0552  WBC 6.1 5.7  NEUTROABS 4.5  --   HGB 10.0* 9.7*  HCT 30.4* 29.4*  MCV 95.3 95.8  PLT 266 309   Cardiac Enzymes: No results found for this basename: CKTOTAL, CKMB, CKMBINDEX, TROPONINI,  in the last 168 hours BNP: BNP (last 3 results)  Recent Labs  10/09/13 1256  PROBNP 827.5*   CBG: No results found for this basename: GLUCAP,  in the last 168 hours     Signed:  Kalie Cabral  [facility] 10/24/2013, 4:46 PM

## 2013-10-27 NOTE — Care Management Note (Signed)
    Page 1 of 1   10/27/2013     12:07:52 PM   CARE MANAGEMENT NOTE 10/27/2013  Patient:  DAMERE, BRANDENBURG   Account Number:  [id number]  Date Initiated:  10/24/2013  Documentation initiated by:  Anibal Henderson  Subjective/Objective Assessment:   Pt is from home with spouse. Admitted with DVT. Pt is going home on Xeralto. Checked with CMA for cost to pt thru his Ins co.Pt is in the donut hole and wil have to pay 52% of the cost.  Pt was able to use one of the drug co. cards for     Action/Plan:   a free month's supply of the drug, and will only have to pay for one month, and the will be starting over next year   Anticipated DC Date:  10/24/2013   Anticipated DC Plan:  HOME/SELF CARE      DC Planning Services  CM consult      Choice offered to / List presented to:             Status of service:  Completed, signed off Medicare Important Message given?  NA - LOS <3 / Initial given by admissions (If response is "NO", the following Medicare IM given date fields will be blank) Date Medicare IM given:   Date Additional Medicare IM given:    Discharge Disposition:  HOME/SELF CARE  Per UR Regulation:  Reviewed for med. necessity/level of care/duration of stay  If discussed at Long Length of Stay Meetings, dates discussed:    Comments:  10/27/13 1206 Anibal Henderson RN/CM delayed entry

## 2013-11-06 ENCOUNTER — Encounter (INDEPENDENT_AMBULATORY_CARE_PROVIDER_SITE_OTHER): Payer: Self-pay | Admitting: General Surgery

## 2013-11-06 ENCOUNTER — Encounter (INDEPENDENT_AMBULATORY_CARE_PROVIDER_SITE_OTHER): Payer: Medicare Other | Admitting: General Surgery

## 2013-11-06 ENCOUNTER — Ambulatory Visit (INDEPENDENT_AMBULATORY_CARE_PROVIDER_SITE_OTHER): Payer: Medicare Other | Admitting: General Surgery

## 2013-11-06 DIAGNOSIS — Z933 Colostomy status: Secondary | ICD-10-CM

## 2013-11-06 NOTE — Patient Instructions (Signed)
Continue to take remeron Work on eating as much as you can

## 2013-11-07 ENCOUNTER — Ambulatory Visit (INDEPENDENT_AMBULATORY_CARE_PROVIDER_SITE_OTHER): Payer: Medicare Other | Admitting: *Deleted

## 2013-11-07 ENCOUNTER — Telehealth (INDEPENDENT_AMBULATORY_CARE_PROVIDER_SITE_OTHER): Payer: Self-pay | Admitting: General Surgery

## 2013-11-07 DIAGNOSIS — Z23 Encounter for immunization: Secondary | ICD-10-CM

## 2013-11-07 NOTE — Telephone Encounter (Signed)
Pt returned  call stating he will be going to Dr Sueanne Margarita at [facility] oncology on Dec 1,2014.

## 2013-11-07 NOTE — Telephone Encounter (Signed)
Left message on machine for patient to call back and ask for me or triage. To see if patient has decided which oncologist he would like to see. Awaiting call back.

## 2013-11-11 NOTE — Progress Notes (Signed)
Subjective:   Delayed noted entry from 11/06/2013    Patient ID: Peter Clarke, male   DOB: Sep 24, 1946, 67 y.o.   MRN: [id number]  HPI 67 year old Caucasian male with a history of liver transplant more than 10 years ago comes in for followup after undergoing Urgent exploratory laparotomy with sigmoid colectomy and end colostomy (Hartman's procedure). He had a very prolonged and protracted postoperative course. He ultimately ended up being transferred to [facility] where he ended up getting additional percutaneous drains placed for intra-abdominal fluid collections. Although strains have been subsequently removed. He was admitted to [facility] from October 23 through the 25th for dehydration. He was seen in our office around November 5 by one of my partners it was noticed to have unilateral lower extremity swelling. An ultrasound was performed which demonstrated a right lower extremity deep venous thrombus. He was admitted to [facility] from November 5 2 the seventh for anticoagulation. He was also found to have a right axillary DVT from a PICC line. He was started on xarleto for anticoagulation. He most recently has seen Dr. Allena Katz and Dr. Arva Chafe at [facility] On November 13. He has been discharged from the infectious disease clinic at [facility]. He is no longer on any antibiotics. During his hospitalization at [facility] he was started on an appetite stimulant Remeron. He states that 3 weeks ago he had lots of nausea and some dry heaves but he hasn't had any nausea for the past 2 weeks. He states for the past 2 half weeks his felt that he's made a significant improvement. He denies any fevers or chills. He denies any weakness. He denies any emesis. His colostomy is working normally. He is having daily output. He states that he still doesn't have a significant appetite. He states that his taste is still off. He reports some occasional pain in his mid upper back radiating to the front. The pain is relieved with pain  medication. The discomfort happens more in the evening. He reports normal urination.  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 He was seeing Dr. Aggie Cosier at [facility].  Review of Systems     Objective:   Physical Exam Wt 149.4 lbs; HR 88 BP 130/68  Gen: alert, NAD, non-toxic appearing, thin Pupils: equal, no scleral icterus Pulm: Lungs clear to auscultation, symmetric chest rise CV: regular rate and rhythm Abd: soft, nontender, nondistended. Well-healed incision. No cellulitis. No incisional hernia. L colostomy viable and intact - no hernia Ext: no edema, Skin: no rash, no jaundice     Assessment:     Perforated Sigmoid diverticulitis Status post exploratory laparotomy, sigmoid colectomy with end colostomy (Hartman's procedure) History of cardiac transplantation Protein calorie malnutrition Tonsillar cancer Right lower extremity and right upper extremity DVT    Plan:     Overall he is making slow progress. He has maintained his weight since he was last seen at [facility] outpatient clinic. I encouraged him to continue with the Remeron. We discussed protein supplements. I told him and his wife to keep an eye on the back pain. I advised him to contact the office should he develop fever chills, worsening abdominal pain.  We discussed colostomy reversal. I explained that I would not reverse him until he is about 6 months out from his initial surgery. I also recommended until waiting after he is done with his anticoagulation for his acute DVTs.  He initially indicated that he did not want to receive  chemotherapy and radiation treatment for his tonsillar cancer until his colostomy was reversed. I explained that I would not recommend that. I explained to him and his wife that at minimum he must followup with the head and neck oncologist to discuss adjuvant treatment for his tonsillar cancer. He and his wife had a lot of questions regarding his long-term  prognosis with or without chemotherapy and radiation for his tonsillar cancer. I explained to him that I was not qualified to give them a prognosis. He reports he had severe issues while undergoing just a few sessions of radiation treatment. I explained that a consultation with the oncologist does not committ him to receive adjuvant therapy but he and his wife should at least discuss management options as well as risk of no adjuvant therapy. I offered him referral to [facility] oncology, [facility] oncology, Or back to [facility] regional.  If he chooses not to receive adjuvant therapy for his tonsillar cancer I explained that he would have to have a full metastatic workup before I would even contemplate reversing his colostomy in 6 months or so. I also explained that he could have a permanent colostomy.  He is going to followup with me in early January 2015.  He contacted our office the day after his appointment with me and indicated he was going to see Dr. Sueanne Margarita at [facility]. [name], MD, FACS General, Bariatric, & Minimally Invasive Surgery [facility], [place]

## 2013-11-17 ENCOUNTER — Ambulatory Visit: Payer: Self-pay | Admitting: Oncology

## 2013-11-19 ENCOUNTER — Ambulatory Visit: Payer: Self-pay | Admitting: Oncology

## 2013-12-05 IMAGING — US US EXTREM  UP VENOUS BILAT
1 series · 14 of 24 positions shown · non-contrast
Comparison: None.

CLINICAL DATA: Right arm pain

EXAM:
BILATERAL UPPER EXTREMITY VENOUS ULTRASOUND
TECHNIQUE: Gray-scale sonography with graded compression, as well as color
Doppler and duplex ultrasound were performed to evaluate the upper
extremity deep venous system from the level of the subclavian vein
and including the jugular, axillary, basilic and upper cephalic
vein. Spectral Doppler was utilized to evaluate flow at rest and
with distal augmentation maneuvers.

[Series 1: us extrem up venous bilat · 0.05mm/px · 14 of 65 slices shown]
[im 1/65]
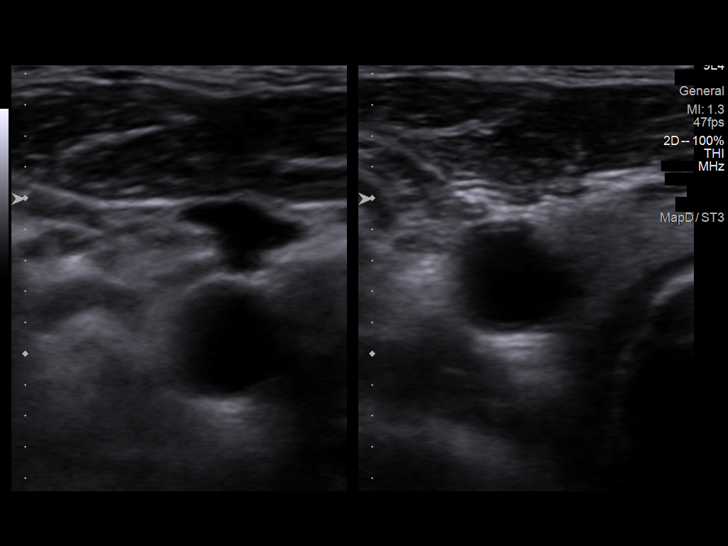
[im 6/65]
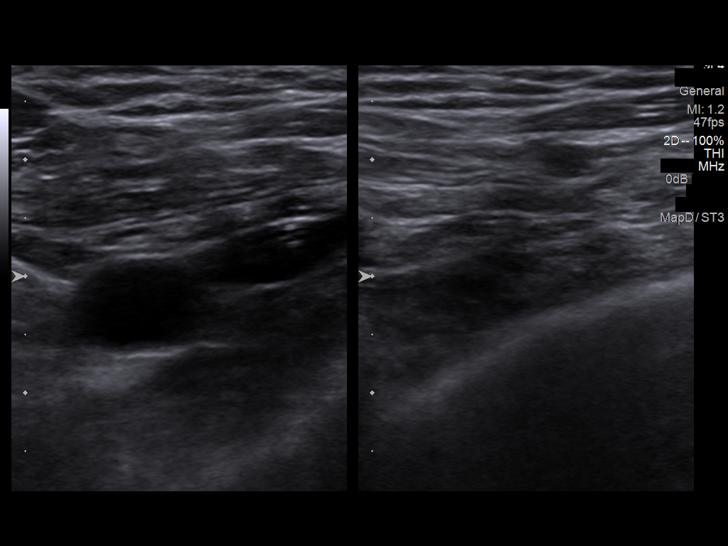
[im 12/65]
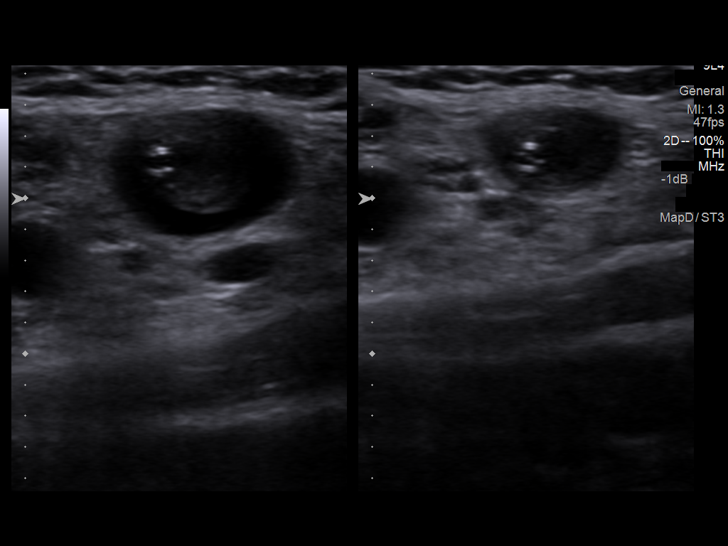
[im 17/65]
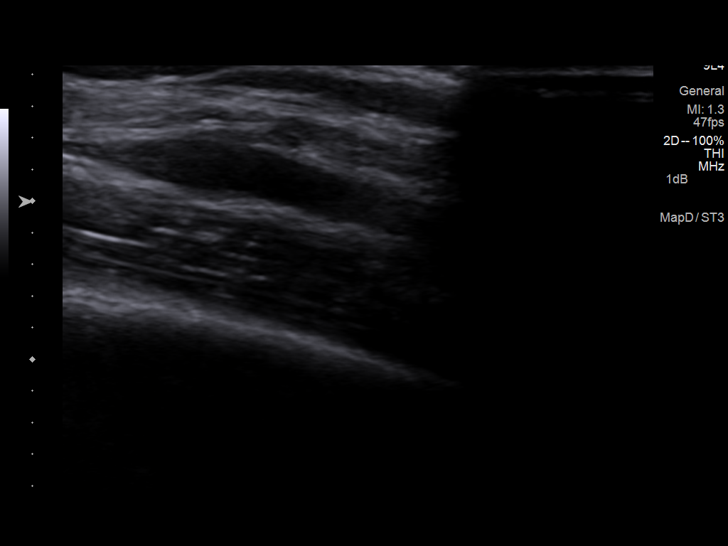
[im 20/65]
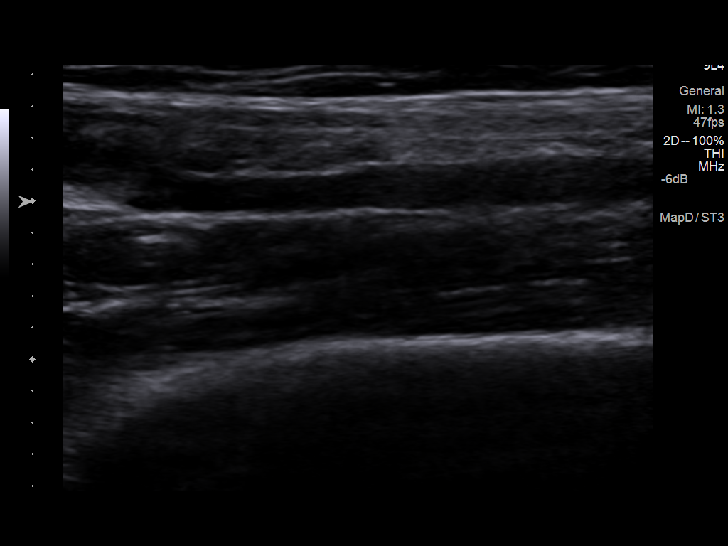
[im 26/65]
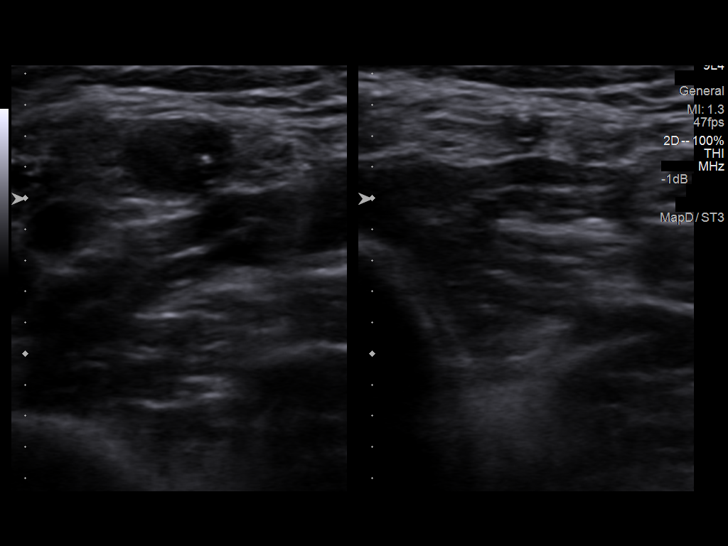
[im 31/65]
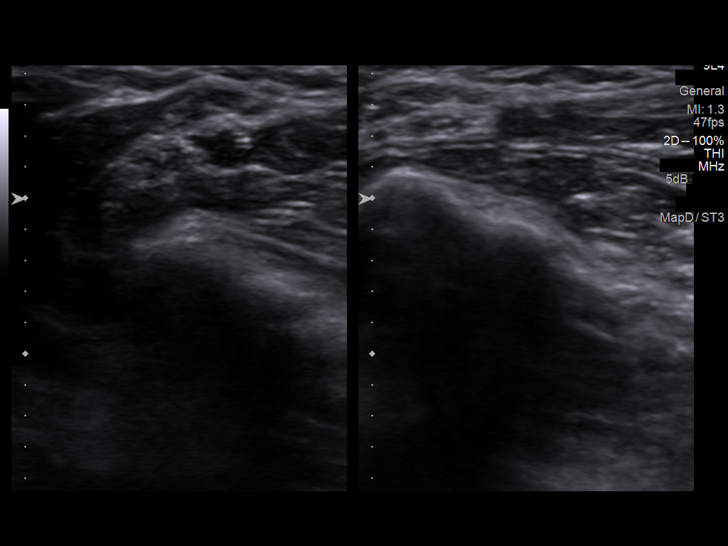
[im 34/65]
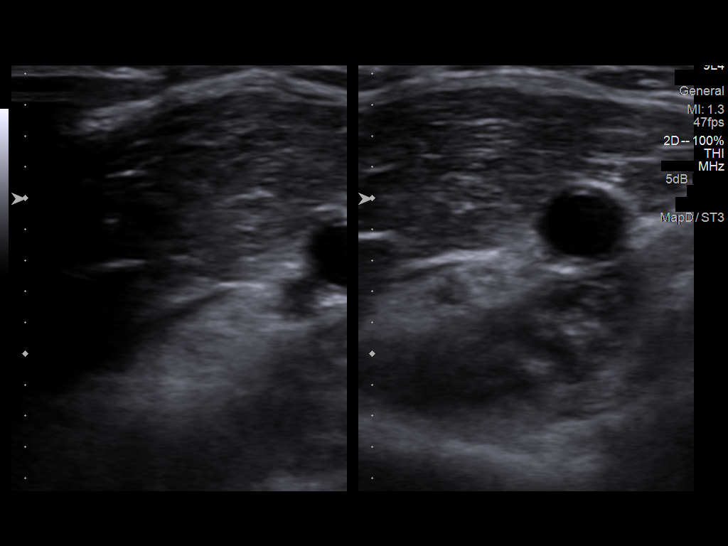
[im 39/65]
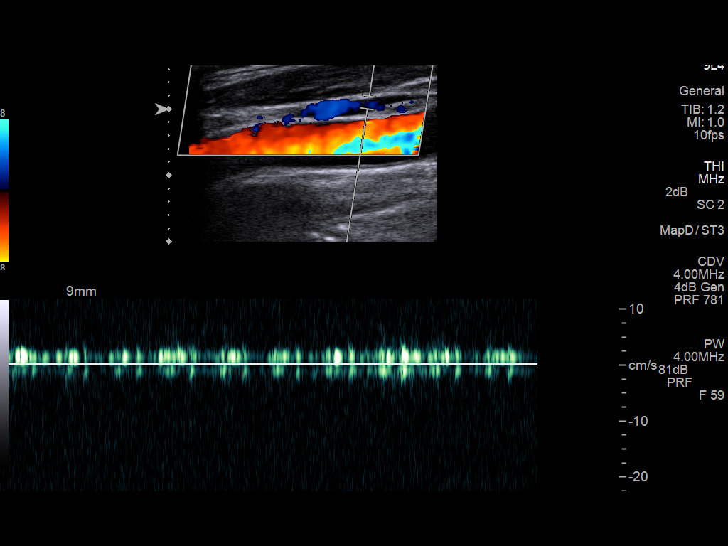
[im 45/65]
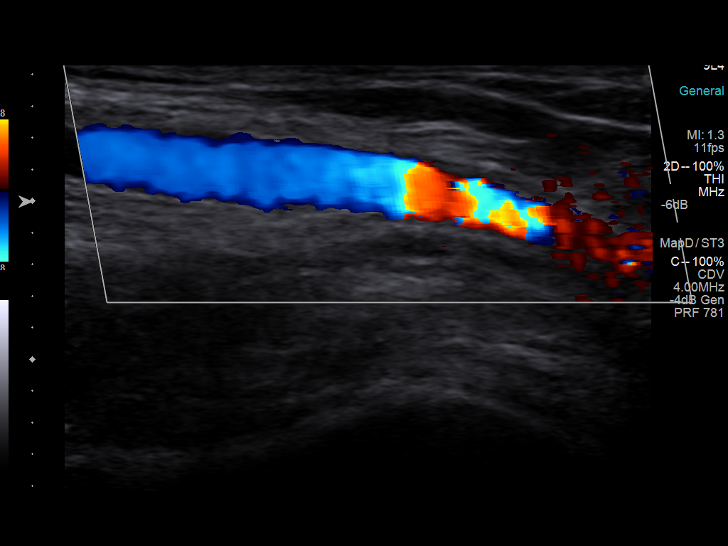
[im 51/65]
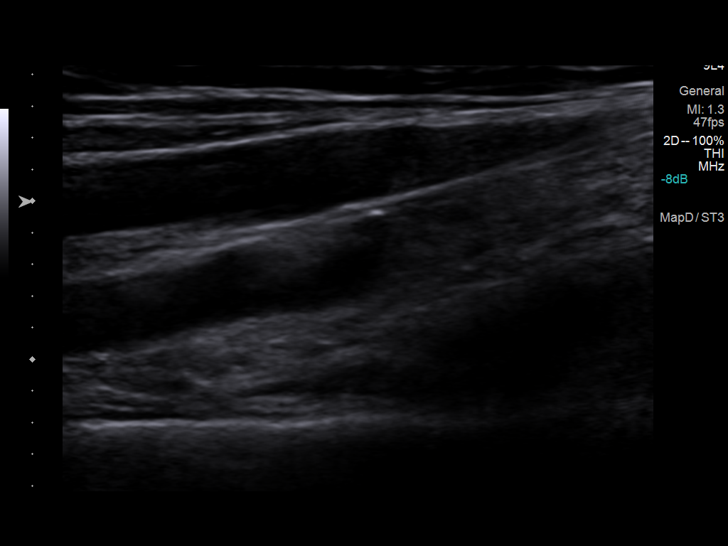
[im 53/65]
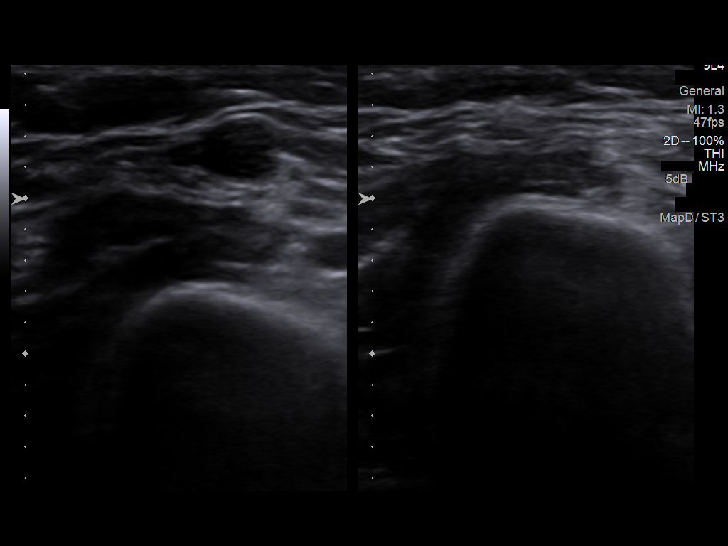
[im 59/65]
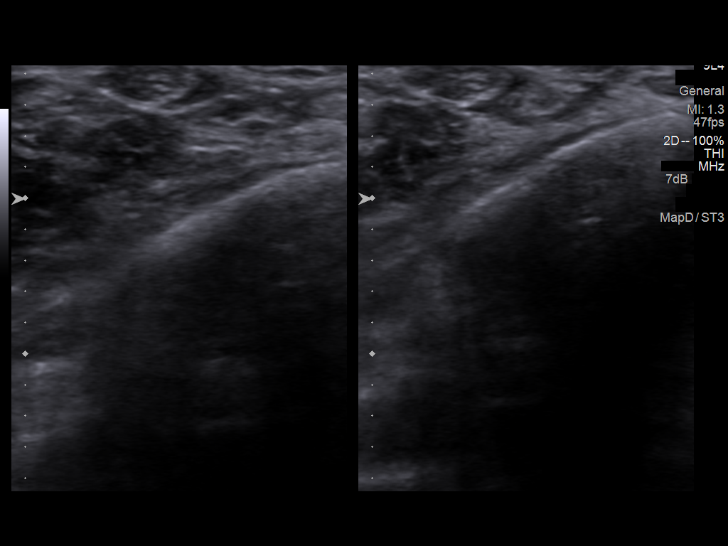
[im 65/65]
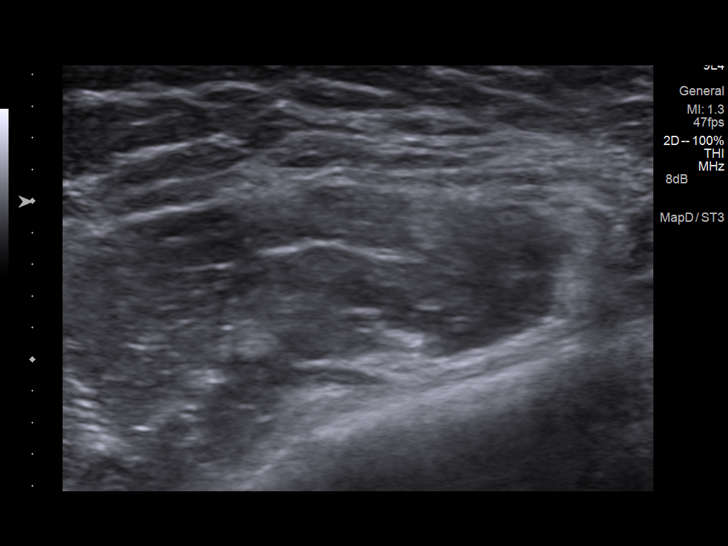

[14 of 24 positions shown; findings below may reference images not displayed]

FINDINGS: Thrombus within deep veins: Echogenic thrombus is noted within the
right axillary vein.

Compressibility of deep veins: Partial compressibility is noted
within the right axillary vein consistent with the known thrombus.

Duplex waveform respiratory phasicity:  Normal.

Duplex waveform response to augmentation:  Normal.

Venous reflux:  None visualized.

Other findings:  None visualized.
IMPRESSION: Right axillary deep venous thrombosis.

No other focal abnormality is noted.

## 2013-12-09 LAB — COMPREHENSIVE METABOLIC PANEL
Albumin: 3.6 g/dL (ref 3.4–5.0)
Anion Gap: 5 — ABNORMAL LOW (ref 7–16)
BUN: 37 mg/dL — ABNORMAL HIGH (ref 7–18)
EGFR (African American): 33 — ABNORMAL LOW
EGFR (Non-African Amer.): 29 — ABNORMAL LOW
Potassium: 4.1 mmol/L (ref 3.5–5.1)
Total Protein: 7.2 g/dL (ref 6.4–8.2)

## 2013-12-09 LAB — CBC CANCER CENTER
Basophil #: 0 x10 3/mm (ref 0.0–0.1)
Eosinophil #: 0.1 x10 3/mm (ref 0.0–0.7)
Eosinophil %: 0.9 %
HGB: 11.3 g/dL — ABNORMAL LOW (ref 13.0–18.0)
Lymphocyte #: 0.7 x10 3/mm — ABNORMAL LOW (ref 1.0–3.6)
MCH: 31.1 pg (ref 26.0–34.0)
Monocyte #: 0.9 x10 3/mm (ref 0.2–1.0)
Monocyte %: 9.5 %
Neutrophil #: 8 x10 3/mm — ABNORMAL HIGH (ref 1.4–6.5)
Neutrophil %: 81.8 %
RBC: 3.64 10*6/uL — ABNORMAL LOW (ref 4.40–5.90)
RDW: 14.5 % (ref 11.5–14.5)

## 2013-12-16 LAB — CBC CANCER CENTER
Basophil #: 0 x10 3/mm (ref 0.0–0.1)
Eosinophil #: 0.1 x10 3/mm (ref 0.0–0.7)
Eosinophil %: 1.4 %
HCT: 35.1 % — ABNORMAL LOW (ref 40.0–52.0)
HGB: 11.5 g/dL — ABNORMAL LOW (ref 13.0–18.0)
Lymphocyte #: 0.8 x10 3/mm — ABNORMAL LOW (ref 1.0–3.6)
Lymphocyte %: 10.7 %
MCHC: 32.8 g/dL (ref 32.0–36.0)
MCV: 96 fL (ref 80–100)
Monocyte #: 0.9 x10 3/mm (ref 0.2–1.0)
Monocyte %: 11.1 %
Platelet: 246 x10 3/mm (ref 150–440)
WBC: 7.7 x10 3/mm (ref 3.8–10.6)

## 2013-12-16 LAB — COMPREHENSIVE METABOLIC PANEL
Alkaline Phosphatase: 49 U/L
Anion Gap: 5 — ABNORMAL LOW (ref 7–16)
Bilirubin,Total: 0.2 mg/dL (ref 0.2–1.0)
Calcium, Total: 9.6 mg/dL (ref 8.5–10.1)
Chloride: 103 mmol/L (ref 98–107)
Creatinine: 1.99 mg/dL — ABNORMAL HIGH (ref 0.60–1.30)
EGFR (African American): 39 — ABNORMAL LOW
EGFR (Non-African Amer.): 34 — ABNORMAL LOW
Glucose: 115 mg/dL — ABNORMAL HIGH (ref 65–99)
SGOT(AST): 11 U/L — ABNORMAL LOW (ref 15–37)
Sodium: 138 mmol/L (ref 136–145)

## 2013-12-16 LAB — MAGNESIUM: Magnesium: 1.6 mg/dL — ABNORMAL LOW

## 2013-12-18 ENCOUNTER — Ambulatory Visit: Payer: Self-pay | Admitting: Oncology

## 2013-12-23 LAB — CBC CANCER CENTER
BASOS PCT: 0.4 %
Basophil #: 0 x10 3/mm (ref 0.0–0.1)
EOS ABS: 0.1 x10 3/mm (ref 0.0–0.7)
Eosinophil %: 1.1 %
HCT: 36.4 % — AB (ref 40.0–52.0)
HGB: 11.9 g/dL — ABNORMAL LOW (ref 13.0–18.0)
LYMPHS PCT: 5.3 %
Lymphocyte #: 0.4 x10 3/mm — ABNORMAL LOW (ref 1.0–3.6)
MCH: 31.4 pg (ref 26.0–34.0)
MCHC: 32.7 g/dL (ref 32.0–36.0)
MCV: 96 fL (ref 80–100)
Monocyte #: 0.7 x10 3/mm (ref 0.2–1.0)
Monocyte %: 8.4 %
Neutrophil #: 7.1 x10 3/mm — ABNORMAL HIGH (ref 1.4–6.5)
Neutrophil %: 84.8 %
PLATELETS: 242 x10 3/mm (ref 150–440)
RBC: 3.79 10*6/uL — ABNORMAL LOW (ref 4.40–5.90)
RDW: 13.5 % (ref 11.5–14.5)
WBC: 8.4 x10 3/mm (ref 3.8–10.6)

## 2013-12-23 LAB — BASIC METABOLIC PANEL
ANION GAP: 9 (ref 7–16)
BUN: 38 mg/dL — AB (ref 7–18)
CALCIUM: 9.5 mg/dL (ref 8.5–10.1)
CO2: 28 mmol/L (ref 21–32)
CREATININE: 2.36 mg/dL — AB (ref 0.60–1.30)
Chloride: 103 mmol/L (ref 98–107)
EGFR (Non-African Amer.): 27 — ABNORMAL LOW
GFR CALC AF AMER: 32 — AB
Glucose: 126 mg/dL — ABNORMAL HIGH (ref 65–99)
Osmolality: 290 (ref 275–301)
Potassium: 4.5 mmol/L (ref 3.5–5.1)
SODIUM: 140 mmol/L (ref 136–145)

## 2013-12-23 LAB — MAGNESIUM: MAGNESIUM: 1.8 mg/dL

## 2013-12-30 LAB — CBC CANCER CENTER
BASOS PCT: 0.5 %
Basophil #: 0 x10 3/mm (ref 0.0–0.1)
EOS ABS: 0.1 x10 3/mm (ref 0.0–0.7)
Eosinophil %: 1.8 %
HCT: 35.3 % — ABNORMAL LOW (ref 40.0–52.0)
HGB: 11.5 g/dL — ABNORMAL LOW (ref 13.0–18.0)
LYMPHS PCT: 6.8 %
Lymphocyte #: 0.5 x10 3/mm — ABNORMAL LOW (ref 1.0–3.6)
MCH: 31.2 pg (ref 26.0–34.0)
MCHC: 32.7 g/dL (ref 32.0–36.0)
MCV: 95 fL (ref 80–100)
MONO ABS: 1.1 x10 3/mm — AB (ref 0.2–1.0)
Monocyte %: 14 %
NEUTROS PCT: 76.9 %
Neutrophil #: 5.8 x10 3/mm (ref 1.4–6.5)
Platelet: 217 x10 3/mm (ref 150–440)
RBC: 3.71 10*6/uL — AB (ref 4.40–5.90)
RDW: 13.3 % (ref 11.5–14.5)
WBC: 7.5 x10 3/mm (ref 3.8–10.6)

## 2013-12-30 LAB — BASIC METABOLIC PANEL
ANION GAP: 7 (ref 7–16)
BUN: 36 mg/dL — ABNORMAL HIGH (ref 7–18)
CALCIUM: 9.7 mg/dL (ref 8.5–10.1)
Chloride: 102 mmol/L (ref 98–107)
Co2: 30 mmol/L (ref 21–32)
Creatinine: 2.24 mg/dL — ABNORMAL HIGH (ref 0.60–1.30)
GFR CALC AF AMER: 34 — AB
GFR CALC NON AF AMER: 29 — AB
GLUCOSE: 119 mg/dL — AB (ref 65–99)
Osmolality: 287 (ref 275–301)
POTASSIUM: 4.7 mmol/L (ref 3.5–5.1)
Sodium: 139 mmol/L (ref 136–145)

## 2013-12-30 LAB — MAGNESIUM: MAGNESIUM: 1.4 mg/dL — AB

## 2014-01-01 ENCOUNTER — Ambulatory Visit (INDEPENDENT_AMBULATORY_CARE_PROVIDER_SITE_OTHER): Payer: Medicare Other | Admitting: General Surgery

## 2014-01-01 ENCOUNTER — Encounter (INDEPENDENT_AMBULATORY_CARE_PROVIDER_SITE_OTHER): Payer: Self-pay

## 2014-01-01 ENCOUNTER — Encounter (INDEPENDENT_AMBULATORY_CARE_PROVIDER_SITE_OTHER): Payer: Self-pay | Admitting: General Surgery

## 2014-01-01 VITALS — BP 118/70 | HR 84 | Resp 14 | Wt 151.6 lb

## 2014-01-01 DIAGNOSIS — I82409 Acute embolism and thrombosis of unspecified deep veins of unspecified lower extremity: Secondary | ICD-10-CM

## 2014-01-01 DIAGNOSIS — Z933 Colostomy status: Secondary | ICD-10-CM

## 2014-01-01 NOTE — Progress Notes (Signed)
Subjective:     Patient ID: Peter Clarke, male   DOB: 09/10/1946, 68 y.o.   MRN: [id number]  HPI 68 year old Caucasian male comes in for followup after undergoing emergency Hartman's procedure in September 2014. He had a long protracted hospital course as well as readmission to outside hospitals. I last saw him in the office in November. Since that time he is restarted adjuvant therapy for his tonsillar cancer at [facility] under the care of Dr. Grayland Ormond. He has completed 17/35 radiation treatments. He has completed 4/7 chemotherapy treatments. He states that he is on track to complete adjuvant therapy sometime in February. He denies any fever, chills, nausea, vomiting, abdominal pain. His wife reports that there is some skin irritation around his ostomy site. He states that this round of adjuvant therapy is going much better than his initial radiation treatment he got prior to his perforated sigmoid diverticulitis. However he is now having certain areas of discomfort in his mouth. He has noticed some change in sensation and sensitivity in certain areas of his mouth particularly with certain foods. He is still on Xarelto for his lower extremity and upper extremity DVT that occurred postoperatively  PMHx, PSHx, SOCHx, FAMHx, ALL reviewed and unchanged Review of Systems 10 point review of systems is performed and all systems are negative except for what is mentioned in the history of present illness    Objective:   Physical Exam  Vitals reviewed. Constitutional: He appears well-developed. He appears cachectic. He is cooperative. No distress.  Eyes: Conjunctivae are normal. No scleral icterus.  Neck: No tracheal deviation present.  Cardiovascular: Normal rate, regular rhythm and normal heart sounds.   Pulmonary/Chest: Effort normal and breath sounds normal. No respiratory distress.  Abdominal: Soft. He exhibits no distension. There is no tenderness. There is no rebound.    Neurological: He  is alert.  Skin: Skin is warm and dry. No rash noted. He is not diaphoretic.  Psychiatric: He has a normal mood and affect. His behavior is normal. Judgment and thought content normal.   BP 118/70  Pulse 84  Resp 14  Wt 151 lb 9.6 oz (68.765 kg)      Assessment:     Status post Hartman's procedure for perforated diverticulitis Tonsillar cancer-receiving adjuvant chemotherapy and radiation Lower extremity and upper extremity DVT on anticoagulation     Plan:     I am glad that he agreed with resuming his adjuvant therapy for his tonsillar cancer. He has gained about 2-1/2 pounds since I last saw him. With the respect to the skin irritation around his ostomy we gave him the contact information for the ostomy nurse. We rediscussed timing of reversal. We need to wait at least 6 months from his colon resection, 6 weeks after the completion of his chemotherapy and radiation, and 6 months from the initiation of his blood thinning for his DVTs, And clearance from his cardiology team at [facility]. I explained all of these things would have to occur prior to scheduling him for ostomy reversal in order to decrease his chances of perioperative complications. I anticipate that they will take him off the blood thinner after 6 months of treatment. However he has not seen anybody in followup regarding timing of cessation of anticoagulation for his DVTs. He has not seen his primary care physician in quite a while. I instructed him to contact his primary care physician for followup appointment regarding discussion of timing of completion of his oral anticoagulation for his DVTs. Followup  with me in early March to discuss ostomy reversal  Leighton Ruff. Redmond Pulling, MD, FACS General, Bariatric, & Minimally Invasive Surgery [facility], [place]

## 2014-01-01 NOTE — Patient Instructions (Signed)
Continue to eat well

## 2014-01-06 LAB — BASIC METABOLIC PANEL
Anion Gap: 11 (ref 7–16)
BUN: 38 mg/dL — ABNORMAL HIGH (ref 7–18)
CALCIUM: 9 mg/dL (ref 8.5–10.1)
CREATININE: 2.14 mg/dL — AB (ref 0.60–1.30)
Chloride: 97 mmol/L — ABNORMAL LOW (ref 98–107)
Co2: 28 mmol/L (ref 21–32)
EGFR (African American): 36 — ABNORMAL LOW
EGFR (Non-African Amer.): 31 — ABNORMAL LOW
Glucose: 139 mg/dL — ABNORMAL HIGH (ref 65–99)
Osmolality: 283 (ref 275–301)
Potassium: 4.4 mmol/L (ref 3.5–5.1)
Sodium: 136 mmol/L (ref 136–145)

## 2014-01-06 LAB — CBC CANCER CENTER
Basophil #: 0 x10 3/mm (ref 0.0–0.1)
Basophil %: 0.4 %
Eosinophil #: 0.1 x10 3/mm (ref 0.0–0.7)
Eosinophil %: 1 %
HCT: 34 % — AB (ref 40.0–52.0)
HGB: 11.1 g/dL — ABNORMAL LOW (ref 13.0–18.0)
Lymphocyte #: 0.4 x10 3/mm — ABNORMAL LOW (ref 1.0–3.6)
Lymphocyte %: 3.7 %
MCH: 30.8 pg (ref 26.0–34.0)
MCHC: 32.7 g/dL (ref 32.0–36.0)
MCV: 94 fL (ref 80–100)
Monocyte #: 1.1 x10 3/mm — ABNORMAL HIGH (ref 0.2–1.0)
Monocyte %: 11.1 %
NEUTROS ABS: 8.1 x10 3/mm — AB (ref 1.4–6.5)
Neutrophil %: 83.8 %
PLATELETS: 266 x10 3/mm (ref 150–440)
RBC: 3.61 10*6/uL — ABNORMAL LOW (ref 4.40–5.90)
RDW: 13 % (ref 11.5–14.5)
WBC: 9.7 x10 3/mm (ref 3.8–10.6)

## 2014-01-06 LAB — MAGNESIUM: MAGNESIUM: 1.6 mg/dL — AB

## 2014-01-09 ENCOUNTER — Encounter: Payer: Self-pay | Admitting: Family Medicine

## 2014-01-09 ENCOUNTER — Ambulatory Visit (INDEPENDENT_AMBULATORY_CARE_PROVIDER_SITE_OTHER): Payer: Medicare Other | Admitting: Family Medicine

## 2014-01-09 VITALS — BP 114/80 | Ht 72.0 in | Wt 153.0 lb

## 2014-01-09 DIAGNOSIS — G894 Chronic pain syndrome: Secondary | ICD-10-CM

## 2014-01-09 DIAGNOSIS — C760 Malignant neoplasm of head, face and neck: Secondary | ICD-10-CM

## 2014-01-09 DIAGNOSIS — I82409 Acute embolism and thrombosis of unspecified deep veins of unspecified lower extremity: Secondary | ICD-10-CM

## 2014-01-09 MED ORDER — RIVAROXABAN 20 MG PO TABS: 20.0000 mg | ORAL_TABLET | Freq: Every day | ORAL | Status: DC

## 2014-01-09 MED ORDER — SUCRALFATE 1 G PO TABS: 1.0000 g | ORAL_TABLET | Freq: Three times a day (TID) | ORAL | Status: AC

## 2014-01-09 NOTE — Progress Notes (Signed)
   Subjective:    Patient ID: Peter Clarke, male    DOB: 1946/03/09, 68 y.o.   MRN: [id number]  HPI Patient is here today for a check up/follow up visit on medication. He states that he need a refill on his Xarelto 20 mg  and Oxycodone 10 mg. States he wants to have some carafate prescribed due to the radiation treatment he is receiving and the irritation it causes in his mouth.   This gentleman has gone through a lot he has head and neck cancer he also had small bowel obstruction and had to have a colostomy put in he hopefully will have surgery in the future to get that corrected. This probably won't occur until May. He does have chronic throat pain he is requesting medication Carafate to try to see Bill help with the burning in his throat. He denies any fevers he is trying eat as best he can. He had DVT last November. PMH benign Review of Systems See above. 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 But cannot rule out other problems. He will be following up with his oncologist soon Lungs are clear no crackles Heart is regular Extremities no edema.      Assessment & Plan:  Possible reconnection of colon by May 2015 Chemo- Dr Gene Kristal/ Dr Ileana Roup gave him refills of this continue this through May at that point will discuss with oncology if it is best for him to stay on long-term or stopped it shortly thereafter. No other type of intervention necessary currently Chronic pain-he currently has adequate amount of oxycodone. He was advised that if his symptoms get worse we may have to adjust medicine otherwise he may use it every 4-6 hours as needed  for pain when he needs a prescription for pain medication he will call us.

## 2014-01-13 LAB — CBC CANCER CENTER
BASOS ABS: 0 x10 3/mm (ref 0.0–0.1)
Basophil %: 0.5 %
Eosinophil #: 0.2 x10 3/mm (ref 0.0–0.7)
Eosinophil %: 2.3 %
HCT: 33.5 % — ABNORMAL LOW (ref 40.0–52.0)
HGB: 10.9 g/dL — AB (ref 13.0–18.0)
LYMPHS ABS: 0.3 x10 3/mm — AB (ref 1.0–3.6)
Lymphocyte %: 4.1 %
MCH: 30.7 pg (ref 26.0–34.0)
MCHC: 32.7 g/dL (ref 32.0–36.0)
MCV: 94 fL (ref 80–100)
Monocyte #: 0.8 x10 3/mm (ref 0.2–1.0)
Monocyte %: 10.6 %
NEUTROS ABS: 6.3 x10 3/mm (ref 1.4–6.5)
NEUTROS PCT: 82.5 %
PLATELETS: 322 x10 3/mm (ref 150–440)
RBC: 3.56 10*6/uL — ABNORMAL LOW (ref 4.40–5.90)
RDW: 13.4 % (ref 11.5–14.5)
WBC: 7.7 x10 3/mm (ref 3.8–10.6)

## 2014-01-13 LAB — BASIC METABOLIC PANEL
Anion Gap: 6 — ABNORMAL LOW (ref 7–16)
BUN: 35 mg/dL — ABNORMAL HIGH (ref 7–18)
CALCIUM: 8.3 mg/dL — AB (ref 8.5–10.1)
CHLORIDE: 100 mmol/L (ref 98–107)
CO2: 30 mmol/L (ref 21–32)
Creatinine: 2.01 mg/dL — ABNORMAL HIGH (ref 0.60–1.30)
GFR CALC AF AMER: 39 — AB
GFR CALC NON AF AMER: 33 — AB
Glucose: 120 mg/dL — ABNORMAL HIGH (ref 65–99)
Osmolality: 281 (ref 275–301)
Potassium: 4.1 mmol/L (ref 3.5–5.1)
Sodium: 136 mmol/L (ref 136–145)

## 2014-01-13 LAB — MAGNESIUM: Magnesium: 1.5 mg/dL — ABNORMAL LOW

## 2014-01-18 ENCOUNTER — Ambulatory Visit: Payer: Self-pay | Admitting: Oncology

## 2014-01-20 LAB — CBC CANCER CENTER
BASOS PCT: 0.5 %
Basophil #: 0 x10 3/mm (ref 0.0–0.1)
EOS PCT: 2.2 %
Eosinophil #: 0.2 x10 3/mm (ref 0.0–0.7)
HCT: 34.2 % — ABNORMAL LOW (ref 40.0–52.0)
HGB: 11.2 g/dL — AB (ref 13.0–18.0)
LYMPHS PCT: 3 %
Lymphocyte #: 0.3 x10 3/mm — ABNORMAL LOW (ref 1.0–3.6)
MCH: 30.7 pg (ref 26.0–34.0)
MCHC: 32.7 g/dL (ref 32.0–36.0)
MCV: 94 fL (ref 80–100)
Monocyte #: 0.8 x10 3/mm (ref 0.2–1.0)
Monocyte %: 8.1 %
Neutrophil #: 8.2 x10 3/mm — ABNORMAL HIGH (ref 1.4–6.5)
Neutrophil %: 86.2 %
Platelet: 347 x10 3/mm (ref 150–440)
RBC: 3.65 10*6/uL — ABNORMAL LOW (ref 4.40–5.90)
RDW: 13.1 % (ref 11.5–14.5)
WBC: 9.6 x10 3/mm (ref 3.8–10.6)

## 2014-01-20 LAB — BASIC METABOLIC PANEL
Anion Gap: 9 (ref 7–16)
BUN: 31 mg/dL — AB (ref 7–18)
CALCIUM: 8.3 mg/dL — AB (ref 8.5–10.1)
CO2: 29 mmol/L (ref 21–32)
CREATININE: 1.99 mg/dL — AB (ref 0.60–1.30)
Chloride: 102 mmol/L (ref 98–107)
GFR CALC AF AMER: 39 — AB
GFR CALC NON AF AMER: 34 — AB
GLUCOSE: 99 mg/dL (ref 65–99)
OSMOLALITY: 286 (ref 275–301)
POTASSIUM: 4 mmol/L (ref 3.5–5.1)
Sodium: 140 mmol/L (ref 136–145)

## 2014-01-20 LAB — MAGNESIUM: Magnesium: 1.3 mg/dL — ABNORMAL LOW

## 2014-01-27 LAB — BASIC METABOLIC PANEL
ANION GAP: 7 (ref 7–16)
BUN: 36 mg/dL — AB (ref 7–18)
CALCIUM: 8.6 mg/dL (ref 8.5–10.1)
CO2: 30 mmol/L (ref 21–32)
Chloride: 102 mmol/L (ref 98–107)
Creatinine: 2.16 mg/dL — ABNORMAL HIGH (ref 0.60–1.30)
EGFR (African American): 35 — ABNORMAL LOW
GFR CALC NON AF AMER: 31 — AB
GLUCOSE: 110 mg/dL — AB (ref 65–99)
Osmolality: 287 (ref 275–301)
POTASSIUM: 4.7 mmol/L (ref 3.5–5.1)
Sodium: 139 mmol/L (ref 136–145)

## 2014-01-27 LAB — CBC CANCER CENTER
Basophil #: 0.1 x10 3/mm (ref 0.0–0.1)
Basophil %: 0.6 %
Eosinophil #: 0.3 x10 3/mm (ref 0.0–0.7)
Eosinophil %: 3.1 %
HCT: 34.6 % — ABNORMAL LOW (ref 40.0–52.0)
HGB: 11.2 g/dL — ABNORMAL LOW (ref 13.0–18.0)
LYMPHS PCT: 4 %
Lymphocyte #: 0.4 x10 3/mm — ABNORMAL LOW (ref 1.0–3.6)
MCH: 30.4 pg (ref 26.0–34.0)
MCHC: 32.3 g/dL (ref 32.0–36.0)
MCV: 94 fL (ref 80–100)
Monocyte #: 0.9 x10 3/mm (ref 0.2–1.0)
Monocyte %: 10.1 %
NEUTROS ABS: 7.3 x10 3/mm — AB (ref 1.4–6.5)
Neutrophil %: 82.2 %
Platelet: 259 x10 3/mm (ref 150–440)
RBC: 3.67 10*6/uL — ABNORMAL LOW (ref 4.40–5.90)
RDW: 13.6 % (ref 11.5–14.5)
WBC: 8.9 x10 3/mm (ref 3.8–10.6)

## 2014-01-27 LAB — MAGNESIUM: Magnesium: 1.4 mg/dL — ABNORMAL LOW

## 2014-02-09 ENCOUNTER — Encounter (INDEPENDENT_AMBULATORY_CARE_PROVIDER_SITE_OTHER): Payer: Self-pay | Admitting: General Surgery

## 2014-02-09 ENCOUNTER — Telehealth (INDEPENDENT_AMBULATORY_CARE_PROVIDER_SITE_OTHER): Payer: Self-pay | Admitting: General Surgery

## 2014-02-09 NOTE — Telephone Encounter (Signed)
[name] wife called for help with husband's ostomy bag.  Gave her advice to call either [facility] Na or [place] to speak with an ostomy speciallist or [place] to speak (or meet with) Marice Potter.  She will try this.

## 2014-02-15 ENCOUNTER — Ambulatory Visit: Payer: Self-pay | Admitting: Oncology

## 2014-02-19 LAB — MAGNESIUM: Magnesium: 1.3 mg/dL — ABNORMAL LOW

## 2014-03-11 ENCOUNTER — Encounter (INDEPENDENT_AMBULATORY_CARE_PROVIDER_SITE_OTHER): Payer: Self-pay | Admitting: General Surgery

## 2014-03-11 ENCOUNTER — Ambulatory Visit (INDEPENDENT_AMBULATORY_CARE_PROVIDER_SITE_OTHER): Payer: Medicare Other | Admitting: General Surgery

## 2014-03-11 VITALS — BP 133/85 | HR 68 | Temp 97.1°F | Resp 16 | Ht 72.0 in | Wt 145.6 lb

## 2014-03-11 DIAGNOSIS — Z09 Encounter for follow-up examination after completed treatment for conditions other than malignant neoplasm: Secondary | ICD-10-CM

## 2014-03-11 NOTE — Patient Instructions (Signed)
Continue to try to increase her weight. We need you to followup with all of your subspecialists including your medical oncologist, radiation oncologist, heart transplant team-to make sure you are in the best shape possible for ostomy Reversal. If your PET scan is positive then we will need to delay ostomy reversal. We also need to figure out what the plan is with respect to your anticoagulation-this would probably be best answered by your primary care physician or your heart transplant team. I will plan to see you back in early May

## 2014-03-11 NOTE — Progress Notes (Signed)
Subjective:     Patient ID: Peter Clarke, male   DOB: Mar 31, 1946, 68 y.o.   MRN: [id number]  HPI 68 year old Caucasian male comes in for long-term followup after undergoing emergent Hartman's procedure in September 2014 for perforated diverticulitis. He had a very prolonged and complicated postoperative course requiring admission to outside hospitals x2. He developed upper and lower extremity DVTs requiring xarelto. Complicating the matter was his recent diagnosis of tonsillar cancer just prior to his hospitalization. He states he has completed chemotherapy and radiation for his tonsillar cancer. He reports that his energy level is pretty good. He is back to work. He complains of some soreness with swallowing primarily on his left side. He states that his taste buds have not returned since treatment. He denies any abdominal pain. He states his ostomy is functioning normally however occasionally does have some hard stools. He is scheduled to see his primary care physician on April 28. He is scheduled to have a PET scan on April 2 with followup with his oncology team on April 7 with Dr. Baruch Gouty and Dr. Grayland Ormond. He is scheduled to see his heart transplant team at [facility] in early April as well.  PMHx, PSHx, SOCHx, FAMHx, ALL reviewed and unchanged  Review of Systems 10 point ROS performed and negative except for HPI    Objective:   Physical Exam  Vitals reviewed. Constitutional: He is oriented to person, place, and time. He appears well-developed. He appears cachectic.  HENT:  Head: Normocephalic and atraumatic.  Eyes: Conjunctivae are normal. No scleral icterus.  Cardiovascular: Normal rate.   Pulmonary/Chest: Effort normal and breath sounds normal. No respiratory distress.  Abdominal: Soft. He exhibits no distension. There is no tenderness.    Musculoskeletal: He exhibits no edema and no tenderness.  Neurological: He is alert and oriented to person, place, and time.  Skin: Skin is warm and  dry. No rash noted. No erythema.  Psychiatric: He has a normal mood and affect. His behavior is normal. Judgment and thought content normal.       Assessment:     Status post Hartman's procedure for perforated diverticulitis History of heart transplant Tonsillar cancer Upper/Lower extremity DVTs     Plan:     He appears to be doing well however his weight is down since his last visit. I again reminded him that I would not consider ostomy reversal until he has "clean bill of health "from an oncology standpoint-i.e. Negative PET scan. I also explained that he would need clearance from his heart transplant team and his primary care physician. He states it still unclear who was managing his anticoagulation. I explained that generally he need 6 months of treatment. His 6 months will be up in early April. Advised him to discuss it with his heart transplant team. Assuming that he gets "clearance "from all specialists then we will probably plan for ostomy reversal sometime in May. I will to see him back in early May after he has seen all these physicians to rediscuss ostomy reversal. We briefly discussed the risk of surgery including but not limited to bleeding, infection, anastomotic leak, blood clot formation, Prolonged ileus, hernia formation, anesthesia risks, cardiac and pulmonary issues. He did have issues with morphine during his last hospitalization with hallucinations and delirium. Prior to ostomy reversal he will need a barium enema. In the interim he was encouraged to work on his weight and appetite  Leighton Ruff. Redmond Pulling, MD, FACS General, Bariatric, & Minimally Invasive Surgery [facility]  [place]

## 2014-03-18 ENCOUNTER — Ambulatory Visit: Payer: Self-pay | Admitting: Oncology

## 2014-03-19 ENCOUNTER — Ambulatory Visit: Payer: Self-pay | Admitting: Oncology

## 2014-03-20 ENCOUNTER — Ambulatory Visit: Payer: Self-pay | Admitting: Oncology

## 2014-04-02 ENCOUNTER — Ambulatory Visit (INDEPENDENT_AMBULATORY_CARE_PROVIDER_SITE_OTHER): Payer: Medicare Other | Admitting: Otolaryngology

## 2014-04-02 DIAGNOSIS — C099 Malignant neoplasm of tonsil, unspecified: Secondary | ICD-10-CM

## 2014-04-14 ENCOUNTER — Encounter: Payer: Self-pay | Admitting: Family Medicine

## 2014-04-14 ENCOUNTER — Ambulatory Visit (INDEPENDENT_AMBULATORY_CARE_PROVIDER_SITE_OTHER): Payer: Medicare Other | Admitting: Family Medicine

## 2014-04-14 VITALS — BP 136/82 | Ht 72.0 in | Wt 146.0 lb

## 2014-04-14 DIAGNOSIS — G894 Chronic pain syndrome: Secondary | ICD-10-CM

## 2014-04-14 DIAGNOSIS — C760 Malignant neoplasm of head, face and neck: Secondary | ICD-10-CM

## 2014-04-14 DIAGNOSIS — I82409 Acute embolism and thrombosis of unspecified deep veins of unspecified lower extremity: Secondary | ICD-10-CM

## 2014-04-14 DIAGNOSIS — K5732 Diverticulitis of large intestine without perforation or abscess without bleeding: Secondary | ICD-10-CM

## 2014-04-14 DIAGNOSIS — K5780 Diverticulitis of intestine, part unspecified, with perforation and abscess without bleeding: Secondary | ICD-10-CM

## 2014-04-14 MED ORDER — OXYCODONE HCL 10 MG PO TABS: 10.0000 mg | ORAL_TABLET | Freq: Four times a day (QID) | ORAL | Status: DC | PRN

## 2014-04-14 MED ORDER — OXYCODONE HCL 10 MG PO TABS: 10.0000 mg | ORAL_TABLET | Freq: Four times a day (QID) | ORAL | Status: AC | PRN

## 2014-04-14 NOTE — Progress Notes (Signed)
   Subjective:    Patient ID: Peter Clarke, male    DOB: September 22, 1946, 68 y.o.   MRN: [id number]  HPIRequesting Rx for oxycodone.  Discuss starting back on xarelto. Stopped last Tuesday. Cath put in on Friday.   Throat pain- Dr Benjamine Mola relates scar tissue, could go on for 6 months Left side of tongue raw (Dr Donella Stade -radiation doc- said to remove plate )  Also has surgery coming up  Review of Systems     Objective:   Physical Exam Eardrums were normal mucous membranes moist throat he does have a area where the the cancer was on the left side that his left scar tissue Lungs are clear there is no crackles Heart is regular in Abdomen soft skin warm dry neurologic grossly normal he does have a colostomy bag.       Assessment & Plan:  1. Chronic pain syndrome Patient has pain related to the previous cancer I believe the best approach is his pain medication we did talk about neuropathic medicine such as Lyrica or gabapentin but patient without at this point in time just use oxycodone he denies abusing it he'll followup again in 3 months time.  2. Acute DVT (deep venous thrombosis) Patient has been treated for a DVT for the past 6 months. He has surgery coming up in May. He would prefer to stop the medication not be back on it. We will discuss with his hematologist oncologist regarding this area .  3. Diverticulitis with perforation The patient has surgery coming up in May. I believe this patient is medically stable for this. His cardiologist would need to give him clearance from a cardiology standpoint. But from a medical standpoint I believe this patient can undergo the surgery for reanastomosis of the colon. But patient is at risk for infection because of his previous transplant plus also patient is at risk for DVTs because of cancer history anti-DVT measures would be wise to be taken.  4. Head and neck cancer No sign of reoccurrence he does have scar tissue on the left side of his  neck  Greater and 25 minutes spent with patient discussing all these issues

## 2014-04-17 ENCOUNTER — Telehealth: Payer: Self-pay | Admitting: Family Medicine

## 2014-04-17 ENCOUNTER — Ambulatory Visit: Payer: Self-pay | Admitting: Oncology

## 2014-04-17 NOTE — Telephone Encounter (Signed)
Discussed with patient. Letter faxed to Dr. Greer Pickerel.

## 2014-04-17 NOTE — Telephone Encounter (Signed)
Nurses-I discussed the case with the patient's oncologist Dr. Alyssa Grove at [facility]. He agrees that this patient may stop his Xarelto. Our nurses will go ahead and call the patient to let him know that he may stop this medication. They will also let him know that it is the opinion of the oncologist and myself that if he should have a reoccurrence of the DVT in the future he more than likely will have to be on a blood thinner for life. A letter will be sent to Dr. Greer Pickerel stating that this patient has been told he can come off of his blood thinner. This medically clears him for his surgery that is upcoming. The patient will still need to have his cardiologist be aware of the surgery in order to give cardiology clearance as well. Please fax the letter to Dr. Greer Pickerel and talk with the patient and document the above discussion.

## 2014-04-22 ENCOUNTER — Encounter (INDEPENDENT_AMBULATORY_CARE_PROVIDER_SITE_OTHER): Payer: Self-pay | Admitting: General Surgery

## 2014-04-22 ENCOUNTER — Ambulatory Visit (INDEPENDENT_AMBULATORY_CARE_PROVIDER_SITE_OTHER): Payer: Medicare Other | Admitting: General Surgery

## 2014-04-22 VITALS — BP 130/80 | HR 76 | Resp 18 | Ht 72.0 in | Wt 141.0 lb

## 2014-04-22 DIAGNOSIS — Z933 Colostomy status: Secondary | ICD-10-CM

## 2014-04-22 NOTE — Patient Instructions (Signed)
Call and let me know if you have any additional questions or concerns and how you would like to proceed

## 2014-04-22 NOTE — Progress Notes (Signed)
Patient ID: Peter Clarke, male   DOB: 09-Apr-1946, 68 y.o.   MRN: [id number]  Chief Complaint  Patient presents with  . Other    Colon check and discuss sx for ostomy reversal    HPI Peter Clarke is a 68 y.o. male.   HPI 68 year old Caucasian male comes in for long-term followup regarding his Hartman's procedure which was performed in September 2014 for perforated sigmoid diverticulitis. He has been medically cleared for colostomy reversal from his primary care physician, cardiology team at [facility], and his medical oncologist. He underwent an echocardiogram and left heart catheterization in April and had a clean coronary arteries. He is no longer taking oral anticoagulation for his extremity DVTs.His only main issue and complaint is ongoing discomfort in his tongue and mouth from radiation therapy. He states that he has severe burning pain on his tongue which eventually spread to his mouth which is limiting his oral intake. Right now he is only tolerating liquids such as boost and mushy food. He is drinking about 4 boost shakes a day.  He denies any abdominal pain. He denies any bowel movements from his bottom. He denies any problems with his ostomy. He is maintaining his weight. Past Medical History  Diagnosis Date  . Hypertension   . H/O heart transplant 2003  . Wears glasses   . Wears dentures     upper  . Renal insufficiency   . Cancer     tonsil  . Throat cancer     Past Surgical History  Procedure Laterality Date  . Heart transplant N/A 2003    [facility]-  . Appendectomy    . Colonoscopy    . Hernia repair  2008    rt ing  . Cardiac catheterization  2011  . Tonsillectomy Left 07/07/2013    Procedure: TONSILLECTOMY;  Surgeon: Ascencion Dike, MD;  Location: [place] [facility];  Service: ENT;  Laterality: Left;  . Direct laryngoscopy N/A 07/07/2013    Procedure: DIRECT LARYNGOSCOPY WITH BIOPSY;  Surgeon: Ascencion Dike, MD;  Location: [place];  Service: ENT;   Laterality: N/A;  . Colon resection N/A 09/09/2013    Procedure: Lap. Assisted Colectomy, lysis of adhesions, Colostomy;  Surgeon: Gayland Curry, MD;  Location: [facility];  Service: General;  Laterality: N/A;    Family History  Problem Relation Age of Onset  . Cancer Mother     certain cancer unknown to pt    Social History History  Substance Use Topics  . Smoking status: Former Smoker -- 1.00 packs/day for 43 years    Types: Pipe    Quit date: 10/23/2003  . Smokeless tobacco: Never Used  . Alcohol Use: No    Allergies  Allergen Reactions  . Pepto-Bismol [Bismuth Subsalicylate]     rash    Current Outpatient Prescriptions  Medication Sig Dispense Refill  . carvedilol (COREG) 25 MG tablet Take 25 mg by mouth. Take 1/2 BID      . Oxycodone HCl 10 MG TABS Take 1 tablet (10 mg total) by mouth 4 (four) times daily as needed.  120 tablet  0  . predniSONE (DELTASONE) 5 MG tablet Take 5 mg by mouth 2 (two) times daily with a meal.       . sucralfate (CARAFATE) 1 G tablet Take 1 tablet (1 g total) by mouth 4 (four) times daily -  with meals and at bedtime.  120 tablet  4  . tacrolimus (PROGRAF) 1 MG  capsule Take 3 mg by mouth 2 (two) times daily. 4 mg qam and 3mg  qhs      . Rivaroxaban (XARELTO) 20 MG TABS tablet Take 1 tablet (20 mg total) by mouth daily with supper. Starting 11/27  30 tablet  6   No current facility-administered medications for this visit.    Review of Systems Review of Systems  Constitutional: Negative for fever, chills, appetite change and unexpected weight change.  HENT: Negative for congestion and trouble swallowing.        Mouth pain; difficulty eating; only tolerating pureed foods, liquids due to mouth burning.   Eyes: Negative for visual disturbance.  Respiratory: Negative for chest tightness and shortness of breath.   Cardiovascular: Negative for chest pain and leg swelling.       No PND, no orthopnea, no DOE  Gastrointestinal:       See HPI    Genitourinary: Negative for dysuria and hematuria.  Musculoskeletal: Negative.   Skin: Negative for rash.  Neurological: Negative for seizures and speech difficulty.  Hematological: Does not bruise/bleed easily.  Psychiatric/Behavioral: Negative for behavioral problems and confusion.    Blood pressure 130/80, pulse 76, resp. rate 18, height 6' (1.829 m), weight 141 lb (63.957 kg).  Physical Exam Physical Exam  Vitals reviewed. Constitutional: He is oriented to person, place, and time. He appears well-developed and well-nourished. He appears cachectic. No distress.  HENT:  Head: Normocephalic and atraumatic.  Right Ear: External ear normal.  Left Ear: External ear normal.  Eyes: Conjunctivae are normal. No scleral icterus.  Neck: Normal range of motion. Neck supple. No tracheal deviation present. No thyromegaly present.  Cardiovascular: Normal rate, normal heart sounds and intact distal pulses.   Pulmonary/Chest: Effort normal and breath sounds normal. No respiratory distress. He has no wheezes.  Abdominal: Soft. He exhibits no distension. There is no tenderness. There is no rebound and no guarding. No hernia.    Musculoskeletal: Normal range of motion. He exhibits no edema and no tenderness.  Lymphadenopathy:    He has no cervical adenopathy.  Neurological: He is alert and oriented to person, place, and time. He exhibits normal muscle tone.  Skin: Skin is warm and dry. No rash noted. He is not diaphoretic. No erythema. No pallor.  Psychiatric: He has a normal mood and affect. His behavior is normal. Judgment and thought content normal.    Data Reviewed Medical notes from PCP, his oncologist, Dr Posey Pronto My office notes  Assessment    S/p Hartmann's procedure for perforated diverticulitis  H/o cardiac transplant Tonsillar cancer     Plan    We had a very prolonged conversation today. Unfortunately Peter. Clarke continues to suffer from oropharyngeal discomfort. He is unsure  whether or not he was to proceed with colostomy reversal at this time given his current amount of discomfort in his mouth. He is a little bit afraid of having to deal with discomfort from abdominal surgery as well as the discomfort in his mouth. He states that he has discussed his oropharyngeal discomfort and pain with his oncologist. He is tried Museum/gallery curator and Carafate without any change or relief. He asked that we discussed colon surgery.  I discussed the procedure in detail.  The patient was given Neurosurgeon.  We discussed the risks and benefits of surgery including, but not limited to bleeding, infection (such as wound infection, abdominal abscess), injury to surrounding structures, blood clot formation, urinary retention, incisional hernia, anastomotic stricture, anastomotic leak, possible need for  diverting ostomy, anesthesia risks, pulmonary & cardiac complications such as pneumonia &/or heart attack, need for additional procedures, ileus, & prolonged hospitalization.  We discussed the typical postoperative recovery course, including limitations & restrictions postoperatively. I explained that the likelihood of improvement in their symptoms is good.  We did discuss that he would be a little higher risk for infection and leak given his immunosuppression.  We also discussed possibility of placing a gastrostomy tube at the time of surgery to help with postoperative nutrition. We discussed the risk and benefits of gastrostomy tubes.  In the interim we're going to get a diagnostic enema to evaluate his colon anatomy.  During this time he is going to think about whether or not he wants to proceed with Jeanette Caprice reversal with possible G-tube placement in the near future or wait until his mouth discomfort improves.  All of his questions were asked and answered  Leighton Ruff. Redmond Pulling, MD, FACS General, Bariatric, & Minimally Invasive Surgery [facility] [place]          Gayland Curry 04/22/2014, 9:35 AM

## 2014-04-23 ENCOUNTER — Telehealth (INDEPENDENT_AMBULATORY_CARE_PROVIDER_SITE_OTHER): Payer: Self-pay | Admitting: *Deleted

## 2014-04-23 NOTE — Telephone Encounter (Signed)
LM for pt on voicemail re:  Appt for Xray @ [facility], Friday, 04/24/14 arriving at 8:45.  Advised if pt had any questions regarding this to please call the office.  Peter Clarke

## 2014-04-24 ENCOUNTER — Ambulatory Visit (HOSPITAL_COMMUNITY): Admission: RE | Admit: 2014-04-24 | Payer: Medicare Other | Source: Ambulatory Visit

## 2014-04-24 ENCOUNTER — Ambulatory Visit (HOSPITAL_COMMUNITY)
Admission: RE | Admit: 2014-04-24 | Discharge: 2014-04-24 | Disposition: A | Discharge status: Home / Self Care | Payer: Medicare Other | Source: Ambulatory Visit | Attending: General Surgery | Admitting: General Surgery

## 2014-04-24 DIAGNOSIS — Z01818 Encounter for other preprocedural examination: Secondary | ICD-10-CM | POA: Insufficient documentation

## 2014-04-24 DIAGNOSIS — Z933 Colostomy status: Secondary | ICD-10-CM

## 2014-04-24 MED ORDER — IOHEXOL 300 MG/ML  SOLN
450.0000 mL | Freq: Once | INTRAMUSCULAR | Status: AC | PRN
  Administered 2014-04-24: 450 mL

## 2014-04-28 ENCOUNTER — Encounter (INDEPENDENT_AMBULATORY_CARE_PROVIDER_SITE_OTHER): Payer: Self-pay

## 2014-04-29 ENCOUNTER — Telehealth (INDEPENDENT_AMBULATORY_CARE_PROVIDER_SITE_OTHER): Payer: Self-pay | Admitting: General Surgery

## 2014-04-29 NOTE — Telephone Encounter (Signed)
Dr. Redmond Pulling patient and he told the patient that he can call the office and call up here and talked to the scheduler to schedule surgery

## 2014-04-29 NOTE — Telephone Encounter (Signed)
Called pt to see if he had reached a decision as to when he would like to undergo Hartman's reversal. - Left message.

## 2014-05-07 ENCOUNTER — Ambulatory Visit (INDEPENDENT_AMBULATORY_CARE_PROVIDER_SITE_OTHER): Payer: Medicare Other | Admitting: Otolaryngology

## 2014-05-07 DIAGNOSIS — C099 Malignant neoplasm of tonsil, unspecified: Secondary | ICD-10-CM

## 2014-05-08 ENCOUNTER — Other Ambulatory Visit: Payer: Self-pay | Admitting: Otolaryngology

## 2014-05-08 ENCOUNTER — Encounter (HOSPITAL_BASED_OUTPATIENT_CLINIC_OR_DEPARTMENT_OTHER): Payer: Self-pay | Admitting: *Deleted

## 2014-05-08 NOTE — Progress Notes (Signed)
Pt heart transplant 03-has been here x2 with tonsils-will need istat

## 2014-05-12 ENCOUNTER — Encounter (HOSPITAL_BASED_OUTPATIENT_CLINIC_OR_DEPARTMENT_OTHER): Payer: Self-pay | Admitting: *Deleted

## 2014-05-12 ENCOUNTER — Encounter (HOSPITAL_BASED_OUTPATIENT_CLINIC_OR_DEPARTMENT_OTHER)
Admission: RE | Disposition: A | Discharge status: Home / Self Care | Payer: Self-pay | Source: Ambulatory Visit | Attending: Otolaryngology

## 2014-05-12 ENCOUNTER — Ambulatory Visit (HOSPITAL_BASED_OUTPATIENT_CLINIC_OR_DEPARTMENT_OTHER)
Admission: RE | Admit: 2014-05-12 | Discharge: 2014-05-12 | Disposition: A | Discharge status: Home / Self Care | Payer: Medicare Other | Source: Ambulatory Visit | Attending: Otolaryngology | Admitting: Otolaryngology

## 2014-05-12 ENCOUNTER — Encounter (HOSPITAL_BASED_OUTPATIENT_CLINIC_OR_DEPARTMENT_OTHER): Payer: Medicare Other | Admitting: Anesthesiology

## 2014-05-12 ENCOUNTER — Ambulatory Visit (HOSPITAL_BASED_OUTPATIENT_CLINIC_OR_DEPARTMENT_OTHER): Payer: Medicare Other | Admitting: Anesthesiology

## 2014-05-12 DIAGNOSIS — C090 Malignant neoplasm of tonsillar fossa: Secondary | ICD-10-CM | POA: Insufficient documentation

## 2014-05-12 DIAGNOSIS — C760 Malignant neoplasm of head, face and neck: Secondary | ICD-10-CM

## 2014-05-12 DIAGNOSIS — Z923 Personal history of irradiation: Secondary | ICD-10-CM | POA: Insufficient documentation

## 2014-05-12 DIAGNOSIS — Z9221 Personal history of antineoplastic chemotherapy: Secondary | ICD-10-CM | POA: Insufficient documentation

## 2014-05-12 DIAGNOSIS — Z87891 Personal history of nicotine dependence: Secondary | ICD-10-CM | POA: Insufficient documentation

## 2014-05-12 DIAGNOSIS — I10 Essential (primary) hypertension: Secondary | ICD-10-CM | POA: Insufficient documentation

## 2014-05-12 DIAGNOSIS — Z941 Heart transplant status: Secondary | ICD-10-CM | POA: Insufficient documentation

## 2014-05-12 HISTORY — PX: DIRECT LARYNGOSCOPY: SHX5326

## 2014-05-12 LAB — POCT I-STAT, CHEM 8
BUN: 52 mg/dL — ABNORMAL HIGH (ref 6–23)
CALCIUM ION: 1.27 mmol/L (ref 1.13–1.30)
Chloride: 90 mEq/L — ABNORMAL LOW (ref 96–112)
Creatinine, Ser: 1.6 mg/dL — ABNORMAL HIGH (ref 0.50–1.35)
GLUCOSE: 101 mg/dL — AB (ref 70–99)
HEMATOCRIT: 40 % (ref 39.0–52.0)
HEMOGLOBIN: 13.6 g/dL (ref 13.0–17.0)
Potassium: 4.5 mEq/L (ref 3.7–5.3)
Sodium: 128 mEq/L — ABNORMAL LOW (ref 137–147)
TCO2: 30 mmol/L (ref 0–100)

## 2014-05-12 SURGERY — LARYNGOSCOPY, DIRECT
Anesthesia: General | Site: Mouth | Laterality: Left

## 2014-05-12 MED ORDER — MIDAZOLAM HCL 2 MG/2ML IJ SOLN
INTRAMUSCULAR | Status: AC
  Filled 2014-05-12: qty 2

## 2014-05-12 MED ORDER — OXYCODONE HCL 5 MG PO TABS
5.0000 mg | ORAL_TABLET | Freq: Once | ORAL | Status: AC
  Administered 2014-05-12: 5 mg via ORAL

## 2014-05-12 MED ORDER — SUCCINYLCHOLINE CHLORIDE 20 MG/ML IJ SOLN
INTRAMUSCULAR | Status: DC | PRN
  Administered 2014-05-12: 80 mg via INTRAVENOUS

## 2014-05-12 MED ORDER — FENTANYL CITRATE 0.05 MG/ML IJ SOLN
INTRAMUSCULAR | Status: DC | PRN
  Administered 2014-05-12: 100 ug via INTRAVENOUS

## 2014-05-12 MED ORDER — OXYCODONE HCL 5 MG PO TABS
ORAL_TABLET | ORAL | Status: AC
  Filled 2014-05-12: qty 1

## 2014-05-12 MED ORDER — EPINEPHRINE HCL 1 MG/ML IJ SOLN
INTRAMUSCULAR | Status: AC
  Filled 2014-05-12: qty 1

## 2014-05-12 MED ORDER — DEXAMETHASONE SODIUM PHOSPHATE 4 MG/ML IJ SOLN
INTRAMUSCULAR | Status: DC | PRN
  Administered 2014-05-12: 10 mg via INTRAVENOUS

## 2014-05-12 MED ORDER — LACTATED RINGERS IV SOLN
INTRAVENOUS | Status: DC
  Administered 2014-05-12 (×2): via INTRAVENOUS

## 2014-05-12 MED ORDER — FENTANYL CITRATE 0.05 MG/ML IJ SOLN
INTRAMUSCULAR | Status: AC
  Filled 2014-05-12: qty 4

## 2014-05-12 MED ORDER — PROPOFOL 10 MG/ML IV BOLUS
INTRAVENOUS | Status: DC | PRN
  Administered 2014-05-12: 50 mg via INTRAVENOUS
  Administered 2014-05-12: 150 mg via INTRAVENOUS

## 2014-05-12 MED ORDER — FENTANYL CITRATE 0.05 MG/ML IJ SOLN: 50.0000 ug | INTRAMUSCULAR | Status: DC | PRN

## 2014-05-12 MED ORDER — ONDANSETRON HCL 4 MG/2ML IJ SOLN
INTRAMUSCULAR | Status: DC | PRN
  Administered 2014-05-12: 4 mg via INTRAVENOUS

## 2014-05-12 MED ORDER — LIDOCAINE HCL (CARDIAC) 20 MG/ML IV SOLN
INTRAVENOUS | Status: DC | PRN
  Administered 2014-05-12: 40 mg via INTRAVENOUS

## 2014-05-12 MED ORDER — FENTANYL CITRATE 0.05 MG/ML IJ SOLN
INTRAMUSCULAR | Status: AC
  Filled 2014-05-12: qty 2

## 2014-05-12 MED ORDER — FENTANYL CITRATE 0.05 MG/ML IJ SOLN
25.0000 ug | INTRAMUSCULAR | Status: DC | PRN
  Administered 2014-05-12 (×2): 25 ug via INTRAVENOUS
  Administered 2014-05-12: 50 ug via INTRAVENOUS

## 2014-05-12 MED ORDER — MIDAZOLAM HCL 5 MG/5ML IJ SOLN
INTRAMUSCULAR | Status: DC | PRN
  Administered 2014-05-12: 2 mg via INTRAVENOUS

## 2014-05-12 MED ORDER — MIDAZOLAM HCL 2 MG/2ML IJ SOLN: 1.0000 mg | INTRAMUSCULAR | Status: DC | PRN

## 2014-05-12 SURGICAL SUPPLY — 26 items
CANISTER SUCT 1200ML W/VALVE (MISCELLANEOUS) ×3 IMPLANT
ELECT REM PT RETURN 9FT ADLT (ELECTROSURGICAL) ×3
ELECTRODE REM PT RTRN 9FT ADLT (ELECTROSURGICAL) IMPLANT
GLOVE BIO SURGEON STRL SZ7.5 (GLOVE) ×3 IMPLANT
GLOVE BIOGEL PI IND STRL 7.5 (GLOVE) IMPLANT
GLOVE BIOGEL PI INDICATOR 7.5 (GLOVE) ×2
GLOVE SURG SS PI 7.5 STRL IVOR (GLOVE) ×2 IMPLANT
GOWN STRL REUS W/ TWL LRG LVL3 (GOWN DISPOSABLE) IMPLANT
GOWN STRL REUS W/TWL LRG LVL3 (GOWN DISPOSABLE) ×6
GUARD TEETH (MISCELLANEOUS) ×2 IMPLANT
MARKER SKIN DUAL TIP RULER LAB (MISCELLANEOUS) IMPLANT
NDL SPNL 22GX7 QUINCKE BK (NEEDLE) IMPLANT
NDL SPNL 25GX3.5 QUINCKE BL (NEEDLE) IMPLANT
NEEDLE SPNL 22GX7 QUINCKE BK (NEEDLE) IMPLANT
NEEDLE SPNL 25GX3.5 QUINCKE BL (NEEDLE) IMPLANT
NS IRRIG 1000ML POUR BTL (IV SOLUTION) ×1 IMPLANT
PATTIES SURGICAL .5 X3 (DISPOSABLE) ×1 IMPLANT
SHEET MEDIUM DRAPE 40X70 STRL (DRAPES) ×3 IMPLANT
SLEEVE SCD COMPRESS KNEE MED (MISCELLANEOUS) ×2 IMPLANT
SOLUTION BUTLER CLEAR DIP (MISCELLANEOUS) IMPLANT
SPONGE GAUZE 4X4 12PLY STER LF (GAUZE/BANDAGES/DRESSINGS) ×3 IMPLANT
SYR CONTROL 10ML LL (SYRINGE) IMPLANT
SYR TB 1ML LL NO SAFETY (SYRINGE) IMPLANT
TOWEL OR 17X24 6PK STRL BLUE (TOWEL DISPOSABLE) ×3 IMPLANT
TUBE CONNECTING 20'X1/4 (TUBING) ×1
TUBE CONNECTING 20X1/4 (TUBING) ×2 IMPLANT

## 2014-05-12 NOTE — Anesthesia Preprocedure Evaluation (Signed)
Anesthesia Evaluation  Patient identified by MRN, date of birth, ID band Patient awake    Reviewed: Allergy & Precautions, H&P , NPO status , Patient's Chart, lab work & pertinent test results, reviewed documented beta blocker date and time   Airway Mallampati: IV TM Distance: >3 FB Neck ROM: Full  Mouth opening: Limited Mouth Opening  Dental no notable dental hx. (+) Edentulous Upper, Dental Advisory Given   Pulmonary neg pulmonary ROS, former smoker,  breath sounds clear to auscultation  Pulmonary exam normal       Cardiovascular hypertension, On Medications and On Home Beta Blockers Rhythm:Regular Rate:Normal  H/O heart transplant   Neuro/Psych negative neurological ROS  negative psych ROS   GI/Hepatic Neg liver ROS, Throat CA   Endo/Other  negative endocrine ROS  Renal/GU Renal disease  negative genitourinary   Musculoskeletal   Abdominal   Peds  Hematology negative hematology ROS (+)   Anesthesia Other Findings   Reproductive/Obstetrics negative OB ROS                           Anesthesia Physical Anesthesia Plan  ASA: III  Anesthesia Plan: General   Post-op Pain Management:    Induction: Intravenous  Airway Management Planned: Oral ETT and Video Laryngoscope Planned  Additional Equipment:   Intra-op Plan:   Post-operative Plan: Extubation in OR  Informed Consent: I have reviewed the patients History and Physical, chart, labs and discussed the procedure including the risks, benefits and alternatives for the proposed anesthesia with the patient or authorized representative who has indicated his/her understanding and acceptance.   Dental advisory given  Plan Discussed with: CRNA  Anesthesia Plan Comments:         Anesthesia Quick Evaluation

## 2014-05-12 NOTE — Transfer of Care (Signed)
Immediate Anesthesia Transfer of Care Note  Patient: Peter Clarke  Procedure(s) Performed: Procedure(s): DIRECT LARYNGOSCOPY WITH BIOPSY (Left)  Patient Location: PACU  Anesthesia Type:General  Level of Consciousness: awake, alert , oriented and patient cooperative  Airway & Oxygen Therapy: Patient Spontanous Breathing and Patient connected to face mask oxygen  Post-op Assessment: Report given to PACU RN and Post -op Vital signs reviewed and stable  Post vital signs: Reviewed and stable  Complications: No apparent anesthesia complications

## 2014-05-12 NOTE — Brief Op Note (Signed)
05/12/2014  11:19 AM  PATIENT:  Rosalie Doctor  68 y.o. male  PRE-OPERATIVE DIAGNOSIS:  Left tonsillar SCCA  POST-OPERATIVE DIAGNOSIS:  Left tonsillar SCCA  PROCEDURE:  Procedure(s): DIRECT LARYNGOSCOPY WITH BIOPSY (N/A)  SURGEON:  Surgeon(s) and Role:    * Ascencion Dike, MD - Primary  PHYSICIAN ASSISTANT:   ASSISTANTS: none   ANESTHESIA:   general  EBL:  Total I/O In: 1000 [I.V.:1000] Out: -   BLOOD ADMINISTERED:none  DRAINS: none   LOCAL MEDICATIONS USED:  NONE  SPECIMEN:  Source of Specimen:  Left tonsillar biopsy specimens  DISPOSITION OF SPECIMEN:  PATHOLOGY  COUNTS:  YES  TOURNIQUET:  * No tourniquets in log *  DICTATION: .Other Dictation: Dictation Number [id number]  PLAN OF CARE: Discharge to home after PACU  PATIENT DISPOSITION:  PACU - hemodynamically stable.   Delay start of Pharmacological VTE agent (>24hrs) due to surgical blood loss or risk of bleeding: not applicable

## 2014-05-12 NOTE — H&P (Signed)
H&P Update  [name] original H&P dated 05/07/14 reviewed and placed in chart (to be scanned).  I personally examined the patient today.  No change in health. Proceed with direct laryngoscopy and biopsy.

## 2014-05-12 NOTE — Discharge Instructions (Addendum)
No activity restriction.  Resume previous diet and medications.    Post Anesthesia Home Care Instructions  Activity: Get plenty of rest for the remainder of the day. A responsible adult should stay with you for 24 hours following the procedure.  For the next 24 hours, DO NOT: -Drive a car -Paediatric nurse -Drink alcoholic beverages -Take any medication unless instructed by your physician -Make any legal decisions or sign important papers.  Meals: Start with liquid foods such as gelatin or soup. Progress to regular foods as tolerated. Avoid greasy, spicy, heavy foods. If nausea and/or vomiting occur, drink only clear liquids until the nausea and/or vomiting subsides. Call your physician if vomiting continues.  Special Instructions/Symptoms: Your throat may feel dry or sore from the anesthesia or the breathing tube placed in your throat during surgery. If this causes discomfort, gargle with warm salt water. The discomfort should disappear within 24 hours.

## 2014-05-12 NOTE — Anesthesia Procedure Notes (Signed)
Procedure Name: Intubation Date/Time: 05/12/2014 10:53 AM Performed by: Lieutenant Diego Pre-anesthesia Checklist: Patient identified, Timeout performed, Emergency Drugs available, Suction available and Patient being monitored Patient Re-evaluated:Patient Re-evaluated prior to inductionOxygen Delivery Method: Circle system utilized Preoxygenation: Pre-oxygenation with 100% oxygen Intubation Type: IV induction Ventilation: Mask ventilation without difficulty Tube size: 7.0 mm Number of attempts: 1 Airway Equipment and Method: Video-laryngoscopy and Stylet Placement Confirmation: ETT inserted through vocal cords under direct vision,  breath sounds checked- equal and bilateral and positive ETCO2 Secured at: 22 cm Tube secured with: Tape Dental Injury: Teeth and Oropharynx as per pre-operative assessment  Comments: Easy intubation w/ video glidescope

## 2014-05-12 NOTE — Anesthesia Postprocedure Evaluation (Signed)
  Anesthesia Post-op Note  Patient: Peter Clarke  Procedure(s) Performed: Procedure(s): DIRECT LARYNGOSCOPY WITH BIOPSY (Left)  Patient Location: PACU  Anesthesia Type:General  Level of Consciousness: awake and alert   Airway and Oxygen Therapy: Patient Spontanous Breathing  Post-op Pain: moderate  Post-op Assessment: Post-op Vital signs reviewed, Patient's Cardiovascular Status Stable and Respiratory Function Stable  Post-op Vital Signs: Reviewed  Filed Vitals:   05/12/14 1200  BP: 178/99  Pulse: 76  Temp:   Resp: 10    Complications: No apparent anesthesia complications

## 2014-05-13 NOTE — Op Note (Signed)
Peter Clarke, Peter Clarke               ACCOUNT NO.:  [id number]  MEDICAL RECORD NO.:  [id number]  LOCATION:                                 FACILITY:  PHYSICIAN:  Leta Baptist, MD            DATE OF BIRTH:  16-Jun-1946  DATE OF PROCEDURE:  05/12/2014 DATE OF DISCHARGE:  05/12/2014                              OPERATIVE REPORT   SURGEON:  Leta Baptist, MD  PREOPERATIVE DIAGNOSES: 1. Left tonsillar squamous cell carcinoma, status post chemoradiation     therapy. 2. Now with left tonsillar ulcer.  POSTOPERATIVE DIAGNOSES: 1. Left tonsillar squamous cell carcinoma, status post chemoradiation     therapy. 2. Now with left tonsillar ulcer.  PROCEDURE PERFORMED:  Direct laryngoscopy with biopsy of the left tonsillar fossa.  ANESTHESIA:  General endotracheal tube anesthesia.  COMPLICATIONS:  None.  ESTIMATED BLOOD LOSS:  Minimal.  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  He completed his chemoradiation treatment in February.  PET scan showed resolution of his lymph node metastasis.  The large left parapharyngeal mass was no longer measurable.  However, there was persistent FDG uptake at the left tonsillar fossa.  On examination, the patient was noted to have persistently large ulcer at the left tonsillar fossa.  The patient also complains of persistent pain on the left side.  Based on the above findings, the decision was made for the patient to undergo direct laryngoscopy and biopsy of the left tonsillar ulcer to evaluate for a new residual tumor.  The risks, benefits, alternatives, and details of the procedure were discussed with the patient.  Questions were invited and answered.  Informed consent was obtained.  DESCRIPTION:  The patient was taken to the operating room and placed supine on the operating table.  General endotracheal tube anesthesia was administered by the anesthesiologist.  The patient was  positioned and prepped and draped in a standard fashion for direct laryngoscopy and biopsy.  Dedo laryngoscope was inserted via the oral opening into the pharynx.  Large ulcer in the area is noted to encompass the entire left tonsillar fossa.  No obvious mass or tumor was noted.  Eight biopsy specimens were obtained throughout the tonsillar fossa and the surrounding soft tissue.  The biopsy specimen were obtained with a large cup forceps.  The specimens were sent to the Pathology Department for permanent histologic identification.  The Dedo laryngoscope was then used to examine the rest of the pharynx and larynx.  The epiglottis, aryepiglottic folds, arytenoids, piriform sinuses, and the true and false vocal cords were all noted to be normal.  No other suspicious mass or lesion was noted.  The Dedo laryngoscope was withdrawn.  The patient tolerated the procedure well.  He was extubated and transferred to the recovery room in good condition.  OPERATIVE FINDINGS:  A large ulcerative area was noted to cover the entire left tonsillar fossa.  No obvious palpable tumor was noted. Eight biopsy specimens were obtained from the left tonsillar ulcer.  FOLLOWUP CARE:  The patient will be discharged home once he is awake and  alert.  He will follow up in my office in 1 week.     Leta Baptist, MD     ST/MEDQ  D:  05/12/2014  T:  05/13/2014  Job:  [id number]  cc:   Delight Hoh, MD Elayne Snare. Wolfgang Phoenix, MD

## 2014-05-14 ENCOUNTER — Encounter (HOSPITAL_BASED_OUTPATIENT_CLINIC_OR_DEPARTMENT_OTHER): Payer: Self-pay | Admitting: Otolaryngology

## 2014-05-18 ENCOUNTER — Ambulatory Visit: Payer: Self-pay | Admitting: Oncology

## 2014-05-21 ENCOUNTER — Ambulatory Visit (INDEPENDENT_AMBULATORY_CARE_PROVIDER_SITE_OTHER): Payer: Medicare Other | Admitting: Otolaryngology

## 2014-05-21 DIAGNOSIS — C760 Malignant neoplasm of head, face and neck: Secondary | ICD-10-CM

## 2014-05-26 ENCOUNTER — Other Ambulatory Visit: Payer: Self-pay | Admitting: *Deleted

## 2014-05-26 ENCOUNTER — Telehealth: Payer: Self-pay | Admitting: *Deleted

## 2014-05-26 NOTE — Telephone Encounter (Signed)
Pt is having trouble swallowing his pain meds. Beth, from [facility]osp[facility], would like an order for liquid pain meds instead:  Roxanol 20mg /mL  0.54mL q2-4h PRN  Ativan 2mg /mL 0.105mL q2-4h PRN  Duragesic 50 mcg 1 patch q3d

## 2014-05-26 NOTE — Telephone Encounter (Signed)
Please let me speak with her this afternoon

## 2014-05-27 MED ORDER — MORPHINE SULFATE (CONCENTRATE) 20 MG/ML PO SOLN: ORAL | Status: AC

## 2014-05-27 MED ORDER — LORAZEPAM 2 MG/ML IJ SOLN: INTRAMUSCULAR | Status: AC

## 2014-05-27 MED ORDER — FENTANYL 50 MCG/HR TD PT72: 50.0000 ug | MEDICATED_PATCH | TRANSDERMAL | Status: AC

## 2014-05-27 NOTE — Telephone Encounter (Signed)
Scripts faxed to [employer].

## 2014-05-27 NOTE — Addendum Note (Signed)
Addended by: Carmelina Noun on: 05/27/2014 10:05 AM   Modules accepted: Orders

## 2014-05-27 NOTE — Telephone Encounter (Signed)
I tried to call the nurse. Her mailbox is old. Could not leave the message. She called back speak with her

## 2014-05-27 NOTE — Telephone Encounter (Signed)
Please go ahead with writing scripts (3) patch- 10, liquid oxycodone 30 ml , liquid ativan 30 ml

## 2014-05-27 NOTE — Telephone Encounter (Signed)
Dr. Nicki Reaper spoke with [facility] from [facility].

## 2014-05-27 NOTE — Addendum Note (Signed)
Addended by: Carmelina Noun on: 05/27/2014 10:19 AM   Modules accepted: Orders

## 2014-06-17 DEATH — deceased

## 2015-04-09 NOTE — Consult Note (Signed)
Reason for Visit: This 69 year old Male patient presents to the clinic for initial evaluation of  squamous carcinoma of the left tonsil .   Referred by Dr. Grayland Ormond.  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 On ENT evaluation he was noted to have a mass in the left tonsillar region and underwent left tonsillectomy positive for squamous cell carcinoma. P. 16 immunostaining is pending. CT scan demonstrated oropharyngeal mass with effacement of the soft palate and uvula and level II a lymph node measuring 1.4 cm. This was confirmed by PET/CT scan showing hypermetabolic activity in the left parapharyngeal soft tissue and hypermetabolic left upper cervical lymph node. Patient has been seen by medical oncology. He has a past medical history significant for heart transplant. I spoke with the medical oncologist who is considering Erbitux therapy along with radiation therapy. Seen today for radiation opinion he is doing well. Having some slight dysphasia. specific head and neck pain.  Past Hx:    Hypertension:    Inguinial Hernia 2010:    Heart Transplant July 2003:   Past, Family and Social History:  Past Medical History positive   Cardiovascular hypertension   Past Surgical History heart transplant 2003, inguinal hernia repair 2000 and   Family History noncontributory   Social History positive   Social History Comments 40-pack-year smoking history continues to smoke a pipe. No EtOH abuse history   Additional Past Medical and Surgical History accompanied by  his wife today   Allergies:   Pepto-Bismol: Rash  Home Meds:  Home Medications: Medication Instructions Status  tacrolimus 4 mg. 1 tab(s) orally 2 times a day for heart transplant Active  predniSONE 5 mg oral tablet 1 tab(s) orally once a day for heart transplant Active  azaTHIOprine 50 mg oral tablet 3 tab(s) orally once a day Active  carvedilol 25 mg oral tablet 1 tab(s) orally 2 times a day Active  amLODIPine 5 mg oral tablet 1 tab(s) orally once a day Active  Centrum Silver Therapeutic Multiple Vitamins with Minerals oral tablet 1 tab(s) orally once a day Active  Tylenol Extra Strength 500 mg oral tablet 2 tab(s) orally once a day, As Needed - for Headache Active   Review of Systems:  General negative   Performance Status (ECOG) 0   Skin negative   Breast negative   Ophthalmologic negative   ENMT see HPI   Respiratory and Thorax negative   Cardiovascular see HPI   Gastrointestinal negative   Genitourinary negative   Musculoskeletal negative   Neurological negative   Psychiatric negative   Hematology/Lymphatics negative   Endocrine negative   Allergic/Immunologic negative   Nursing Notes:  Nursing Vital Signs and Chemo Nursing Nursing Notes: *CC Vital Signs Flowsheet:   11-Aug-14 13:19  Temp Temperature 97.4  Pulse Pulse 86  Respirations Respirations 20  SBP SBP 133  DBP DBP 76  Pain Scale (0-10)  0  Current Weight (kg) (kg) 79.9  Height (cm) centimeters 178.5  BSA (m2) 1.9   Physical Exam:  General/Skin/HEENT:  General normal   Skin  normal   Eyes normal   Additional PE well-developed male in NAD. Oral cavity shows moderate oral candidiasis. He is a large polypoid mass present in the left tonsillar region consistent with known malignancy. Indirect mirror examination shows upper airway clear tumor does encroach on to the soft palateand into the region of the base of tongue. Neck shows small lymph node in the left subdigastric region.  Remaining neck exam is clear. Lungs are clear to A&P cardiac examination shows regular rate and rhythm.   Breasts/Resp/CV/GI/GU:  Respiratory and Thorax normal   Cardiovascular normal   Gastrointestinal normal   Genitourinary normal   MS/Neuro/Psych/Lymph:  Musculoskeletal normal   Neurological normal   Lymphatics normal   Other Results:  Radiology Results: LabUnknown:    11-Aug-14 11:20, PET/CT Scan Head/Neck CA Initial Staging  PACS Image   Nuclear Med:  PET/CT Scan Head/Neck CA Initial Staging   REASON FOR EXAM:    inital stage head and neck CA  COMMENTS:       PROCEDURE: PET - PET/CT INIT STAG HEAD/NECK CA  - Jul 28 2013 11:20AM     RESULT: Indication: Head and neck cancer  Radiopharmaceutical: 13.14 mCi F18-FDG, intravenously.    Technique: Imaging was performed from the skull base to the mid-thigh   using routine PET/CT acquisition protocol.    Injection site: Left antecubital  Time of FDG injection: 0922 hours  Serum glucose: 97 mg/dL   Time of imaging: 1027 hours with delayed imagesat 1101 hours  Comparison studies: NONE    Findings:    HEAD AND NECK:     There is a large hypermetabolic left parapharyngeal mass with an SUV max   of 17.8 and an SUV average of 12.9. There is a hypermetabolic left upper   cervical lymph node measuring 13 mm in short axis.    CHEST:    There is no abnormal hypermetabolic activity in the chest.    There are bilateral mild emphysematous changes. There is no focal     parenchymal opacity, pleural effusion, or pneumothorax.     The heart size is normal. There is no pericardial effusion.     There no pathologically enlarged mediastinal, hilar, or axillary lymph   nodes.     The osseous structures demonstrate no focal abnormality.     ABDOMEN/PELVIS:    The liver demonstrates no focal abnormality. The gallbladder is   unremarkable. The spleen demonstrates no focal abnormality. The kidneys,   adrenal glands,  pancreas are normal. The bladder is unremarkable.   The unopacified bowel is unremarkable. There is no pneumoperitoneum,   pneumatosis, or portal venous gas. There is diverticulosis without   evidence of diverticulitis. There is no abdominal or pelvic free fluid.   There is no lymphadenopathy.     The abdominal aorta is normal in caliber.    The osseous structures are unremarkable.    IMPRESSION:     1. Hypermetabolic left parapharyngeal soft tissue mass most concerning   for squamous cell carcinoma. There is a hypermetabolic left upper   cervical lymph node concerning for nodal metastasis.  Dictation Site: 1        Verified By: Jennette Banker, M.D., MD   Relevent Results:   Relevant Scans and Labs PET/CT scans and CT scans reviewed   Assessment and Plan: Impression:   stage III squamous carcinoma the left tonsillar pillar in 50 all male status post our transplant 2003 Plan:   at this time I recommended IMRT  radiation therapy. Would include his left tonsillar mass by his PET/CT criteria as well as left cervical lymph node up to 7000 cGy over 7 weeks. Would treat remaining left neck lymph nodes to 5400 cGy using IMRT dose painting technique. We'll spare salivary glands as much as possible to avoid xerostomia. Risks and benefits of treatment including dysphasia,alteration of taste, skin irritation, alteration blood counts, fatigue, all were explained in detail to the patient and his we. Both seem to comprehend my treatment plan well. I have set him up for CT simulation early next week. We will be discussing Erbitux treatment with Dr. Grayland Ormond.  I would like to take this opportunity to thank you for allowing me to continue to participate in this patient's care.  Electronic Signatures: Armstead Peaks (MD)  (Signed 11-Aug-14 15:05)  Authored: HPI, Diagnosis, Past Hx, PFSH, Allergies, Home Meds, ROS, Nursing Notes, Physical Exam, Other Results, Relevent Results, Encounter Assessment  and Plan   Last Updated: 11-Aug-14 15:05 by Armstead Peaks (MD)
